# Patient Record
Sex: Female | Born: 1965 | Hispanic: Yes | Marital: Married | Smoking: Never smoker
Health system: Southern US, Community
[De-identification: ages and names within clinical notes are randomized; demographics above are authoritative.]

## PROBLEM LIST (undated history)

## (undated) DIAGNOSIS — J309 Allergic rhinitis, unspecified: Secondary | ICD-10-CM

## (undated) DIAGNOSIS — M5412 Radiculopathy, cervical region: Secondary | ICD-10-CM

## (undated) DIAGNOSIS — T783XXA Angioneurotic edema, initial encounter: Secondary | ICD-10-CM

## (undated) DIAGNOSIS — E119 Type 2 diabetes mellitus without complications: Secondary | ICD-10-CM

## (undated) DIAGNOSIS — K219 Gastro-esophageal reflux disease without esophagitis: Secondary | ICD-10-CM

## (undated) DIAGNOSIS — L500 Allergic urticaria: Secondary | ICD-10-CM

## (undated) DIAGNOSIS — R748 Abnormal levels of other serum enzymes: Secondary | ICD-10-CM

## (undated) DIAGNOSIS — J329 Chronic sinusitis, unspecified: Secondary | ICD-10-CM

## (undated) DIAGNOSIS — K760 Fatty (change of) liver, not elsewhere classified: Secondary | ICD-10-CM

## (undated) DIAGNOSIS — M722 Plantar fascial fibromatosis: Secondary | ICD-10-CM

## (undated) DIAGNOSIS — M7672 Peroneal tendinitis, left leg: Secondary | ICD-10-CM

## (undated) DIAGNOSIS — F4321 Adjustment disorder with depressed mood: Secondary | ICD-10-CM

## (undated) DIAGNOSIS — M6530 Trigger finger, unspecified finger: Secondary | ICD-10-CM

## (undated) DIAGNOSIS — U071 COVID-19: Secondary | ICD-10-CM

## (undated) HISTORY — PX: BREAST SURGERY: SHX581

## (undated) HISTORY — DX: Angioneurotic edema, initial encounter: T78.3XXA

## (undated) HISTORY — DX: Allergic rhinitis, unspecified: J30.9

## (undated) HISTORY — DX: Fatty (change of) liver, not elsewhere classified: K76.0

## (undated) HISTORY — DX: Abnormal levels of other serum enzymes: R74.8

## (undated) HISTORY — DX: Adjustment disorder with depressed mood: F43.21

## (undated) HISTORY — PX: GALLBLADDER SURGERY: SHX652

## (undated) HISTORY — DX: COVID-19: U07.1

## (undated) HISTORY — DX: Plantar fascial fibromatosis: M72.2

## (undated) HISTORY — PX: CHOLECYSTECTOMY: SHX55

## (undated) HISTORY — PX: BREAST EXCISIONAL BIOPSY: SUR124

---

## 2005-07-06 ENCOUNTER — Ambulatory Visit: Payer: Self-pay | Admitting: Family Medicine

## 2005-07-26 ENCOUNTER — Inpatient Hospital Stay: Payer: Self-pay | Admitting: Obstetrics and Gynecology

## 2007-09-25 ENCOUNTER — Ambulatory Visit: Payer: Self-pay

## 2008-12-10 ENCOUNTER — Ambulatory Visit: Payer: Self-pay

## 2010-02-05 ENCOUNTER — Ambulatory Visit: Payer: Self-pay | Admitting: Family Medicine

## 2010-08-25 ENCOUNTER — Emergency Department: Payer: Self-pay | Admitting: Emergency Medicine

## 2013-06-04 ENCOUNTER — Ambulatory Visit (INDEPENDENT_AMBULATORY_CARE_PROVIDER_SITE_OTHER): Payer: BC Managed Care – PPO | Admitting: Podiatry

## 2013-06-04 ENCOUNTER — Ambulatory Visit (INDEPENDENT_AMBULATORY_CARE_PROVIDER_SITE_OTHER): Payer: BC Managed Care – PPO

## 2013-06-04 ENCOUNTER — Encounter: Payer: Self-pay | Admitting: Podiatry

## 2013-06-04 VITALS — BP 107/66 | HR 82 | Resp 16 | Ht 61.0 in | Wt 185.0 lb

## 2013-06-04 DIAGNOSIS — M79609 Pain in unspecified limb: Secondary | ICD-10-CM

## 2013-06-04 DIAGNOSIS — M79672 Pain in left foot: Secondary | ICD-10-CM

## 2013-06-04 DIAGNOSIS — M79671 Pain in right foot: Secondary | ICD-10-CM

## 2013-06-04 DIAGNOSIS — M722 Plantar fascial fibromatosis: Secondary | ICD-10-CM

## 2013-06-04 DIAGNOSIS — M7730 Calcaneal spur, unspecified foot: Secondary | ICD-10-CM

## 2013-06-04 MED ORDER — TRIAMCINOLONE ACETONIDE 10 MG/ML IJ SUSP
5.0000 mg | Freq: Once | INTRAMUSCULAR | Status: AC
  Administered 2013-06-04: 5 mg via INTRA_ARTICULAR

## 2013-06-04 MED ORDER — MELOXICAM 15 MG PO TABS: 15.0000 mg | ORAL_TABLET | Freq: Every day | ORAL | Status: DC

## 2013-06-04 NOTE — Progress Notes (Signed)
Subjective:     Patient ID: Tonya Mueller, female   DOB: 1966/02/12, 47 y.o.   MRN: [id number]  HPI patient presents with son stating that she has  heel pain of 2 years duration. States that she went to another physician who gave her a cortisone injection which helped for a short period of time. She has more pain in the right than the left and it has always been that way in the past. She states that she is not able to do any of the activities that she enjoys doing and is unable to do any physical activity   Review of Systems  All other systems reviewed and are negative.       Objective:   Physical Exam  Nursing note and vitals reviewed. Constitutional: She appears well-developed and well-nourished.  Cardiovascular: Intact distal pulses.   Musculoskeletal: Normal range of motion.  Neurological: She is alert.  Skin: Skin is warm.   the heel bilateral medial calcaneal tubercle is very sore with the right being worse than the left. The patient has a well-developed arch and mild equinus was noted bilateral. No other pathology noted     Assessment:     Plantar fasciitis of the heel right over left.    Plan:     H&P performed and x-rays reviewed. Injected at the plantar fascia bilateral 3 mg Kenalog 5 mg Xylocaine Marcaine mixture and dispensed subfascial braces bilateral with instructions on usage. Prescribed Mobic 15 mg daily #30 and instructed on reduced activity for the next several day. Reappoint 2 weeks

## 2013-06-04 NOTE — Patient Instructions (Signed)
Be easy on the feet for 24 hours and wear braces when wt. bearing

## 2013-06-04 NOTE — Progress Notes (Signed)
N HURT  L B/L HEELS D 35YRS O SUDDEN C WORSE A AFTER SITTING,  T SEEN DR IN [facility] HAD 1 INJECTION IN RT HEEL, EXERCISES,

## 2013-06-18 ENCOUNTER — Ambulatory Visit (INDEPENDENT_AMBULATORY_CARE_PROVIDER_SITE_OTHER): Payer: BC Managed Care – PPO | Admitting: Podiatry

## 2013-06-18 ENCOUNTER — Encounter: Payer: Self-pay | Admitting: Podiatry

## 2013-06-18 VITALS — BP 131/72 | HR 84 | Resp 12 | Ht 64.0 in | Wt 198.0 lb

## 2013-06-18 DIAGNOSIS — M722 Plantar fascial fibromatosis: Secondary | ICD-10-CM

## 2013-06-18 MED ORDER — TRIAMCINOLONE ACETONIDE 10 MG/ML IJ SUSP
5.0000 mg | Freq: Once | INTRAMUSCULAR | Status: AC
  Administered 2013-06-18: 5 mg via INTRA_ARTICULAR

## 2013-06-18 NOTE — Progress Notes (Signed)
Subjective:     Patient ID: Tonya Mueller, female   DOB: 01/16/1966, 47 y.o.   MRN: [id number]  HPI patient states that I am doing better but my heels still hurt me and my right one seems still to be worse than my left want.   Review of Systems     Objective:   Physical Exam  Nursing note and vitals reviewed. Constitutional: She is oriented to person, place, and time. She appears well-developed and well-nourished.  Cardiovascular: Intact distal pulses.   Musculoskeletal: Normal range of motion.  Neurological: She is oriented to person, place, and time.   patient is found to have pain in the plantar heel right and left with inflammation and fluid around the medial band still noted     Assessment:     Plantar fasciitis heel still noted both feet    Plan:     Reinjected the plantar fascia both feet 3 mg Kenalog 5 mg Xylocaine Marcaine mixture and dispensed a night splint with instructions on using on both feet by switching in the evenings

## 2013-07-09 ENCOUNTER — Ambulatory Visit: Payer: BC Managed Care – PPO | Admitting: Podiatry

## 2013-07-23 ENCOUNTER — Encounter: Payer: Self-pay | Admitting: Podiatry

## 2013-07-23 ENCOUNTER — Ambulatory Visit (INDEPENDENT_AMBULATORY_CARE_PROVIDER_SITE_OTHER): Payer: BC Managed Care – PPO | Admitting: Podiatry

## 2013-07-23 VITALS — BP 107/66 | HR 82 | Resp 16

## 2013-07-23 DIAGNOSIS — M722 Plantar fascial fibromatosis: Secondary | ICD-10-CM

## 2013-07-23 MED ORDER — DICLOFENAC SODIUM 75 MG PO TBEC: 75.0000 mg | DELAYED_RELEASE_TABLET | Freq: Two times a day (BID) | ORAL | Status: DC

## 2013-07-23 MED ORDER — TRIAMCINOLONE ACETONIDE 10 MG/ML IJ SUSP
10.0000 mg | Freq: Once | INTRAMUSCULAR | Status: AC
  Administered 2013-07-23: 10 mg

## 2013-07-23 NOTE — Progress Notes (Signed)
Subjective:     Patient ID: Tonya Mueller, female   DOB: 16-Jul-1966, 47 y.o.   MRN: [id number]  HPI patient states that my right heel is doing better but now the left heel seems to be getting worse and it is very bad on concrete floor   Review of Systems     Objective:   Physical Exam Neurovascular status is intact with exquisite discomfort to palpation medial fascially band left heel    Assessment:     Plantar fasciitis left intense nature    Plan:     H&P performed and today I injected the left plantar fascia 3 mg Kenalog 5 of Xylocaine Marcaine mixture placed on Voltaren 75 mg twice a day and scanned for custom orthotic devices to reduce stress on the heels reappoint when they are ready

## 2013-08-30 ENCOUNTER — Encounter: Payer: Self-pay | Admitting: Podiatry

## 2013-08-30 ENCOUNTER — Ambulatory Visit (INDEPENDENT_AMBULATORY_CARE_PROVIDER_SITE_OTHER): Payer: BC Managed Care – PPO | Admitting: Podiatry

## 2013-08-30 VITALS — BP 107/86 | HR 82 | Resp 16

## 2013-08-30 DIAGNOSIS — M722 Plantar fascial fibromatosis: Secondary | ICD-10-CM

## 2013-08-30 NOTE — Patient Instructions (Signed)

## 2013-08-30 NOTE — Progress Notes (Signed)
Pick up orthotics written and verbal instructions given

## 2013-09-01 NOTE — Progress Notes (Signed)
Subjective:     Patient ID: Tonya Mueller, female   DOB: 02/02/1966, 48 y.o.   MRN: [id number]  HPI patient presents stating I'm doing well and still having some discomfort but only when him on cement floors   Review of Systems     Objective:   Physical Exam Neurovascular status intact with no other health history changes noted and mild discomfort to palpation plantar fascia    Assessment:     Improve plantar fasciitis left and right heel    Plan:     Orthotics dispensed with instructions and discussed the continuation of conservative care. Reappoint her recheck 6 weeks

## 2013-10-04 ENCOUNTER — Ambulatory Visit (INDEPENDENT_AMBULATORY_CARE_PROVIDER_SITE_OTHER): Payer: BC Managed Care – PPO | Admitting: Podiatry

## 2013-10-04 ENCOUNTER — Encounter: Payer: Self-pay | Admitting: Podiatry

## 2013-10-04 VITALS — BP 123/74 | HR 97 | Resp 16 | Ht 62.0 in | Wt 198.0 lb

## 2013-10-04 DIAGNOSIS — M722 Plantar fascial fibromatosis: Secondary | ICD-10-CM

## 2013-10-04 MED ORDER — TRIAMCINOLONE ACETONIDE 10 MG/ML IJ SUSP
10.0000 mg | Freq: Once | INTRAMUSCULAR | Status: AC
  Administered 2013-10-04: 10 mg

## 2013-10-04 NOTE — Progress Notes (Signed)
Subjective:     Patient ID: Tonya Mueller, female   DOB: 03/14/1966, 48 y.o.   MRN: [id number]  HPI patient continues to experience discomfort in the left plantar heel at the insertional point with pain worse with walking and after periods of sitting   Review of Systems     Objective:   Physical Exam Neurovascular status intact with patient well oriented x3 and severe discomfort plantar heel left at the insertional point of the tendon into the calcaneus    Assessment:     Continue plantar fasciitis left inflammation and fluid at the medial band    Plan:     Discussed condition and recommended 1 more injection which was accomplished today 3 mg Kenalog 5 a consent Marcaine mixture and advised if this is not better in 3 weeks we will need to consider surgery. Continue with her night splint and orthotics

## 2013-10-24 ENCOUNTER — Emergency Department: Payer: Self-pay | Admitting: Emergency Medicine

## 2013-10-24 LAB — URINALYSIS, COMPLETE
BILIRUBIN, UR: NEGATIVE
Bacteria: NONE SEEN
Blood: NEGATIVE
GLUCOSE, UR: NEGATIVE mg/dL (ref 0–75)
Ketone: NEGATIVE
Nitrite: NEGATIVE
Ph: 7 (ref 4.5–8.0)
Protein: NEGATIVE
RBC,UR: 2 /HPF (ref 0–5)
SPECIFIC GRAVITY: 1.013 (ref 1.003–1.030)
WBC UR: 11 /HPF (ref 0–5)

## 2013-10-24 LAB — COMPREHENSIVE METABOLIC PANEL
ALBUMIN: 3.2 g/dL — AB (ref 3.4–5.0)
ALK PHOS: 139 U/L — AB
ALT: 32 U/L (ref 12–78)
Anion Gap: 4 — ABNORMAL LOW (ref 7–16)
BILIRUBIN TOTAL: 0.8 mg/dL (ref 0.2–1.0)
BUN: 7 mg/dL (ref 7–18)
CALCIUM: 8.4 mg/dL — AB (ref 8.5–10.1)
CHLORIDE: 106 mmol/L (ref 98–107)
CO2: 29 mmol/L (ref 21–32)
Creatinine: 0.47 mg/dL — ABNORMAL LOW (ref 0.60–1.30)
EGFR (African American): >60
Glucose: 102 mg/dL — ABNORMAL HIGH (ref 65–99)
Osmolality: 276 (ref 275–301)
Potassium: 3.7 mmol/L (ref 3.5–5.1)
SGOT(AST): 19 U/L (ref 15–37)
Sodium: 139 mmol/L (ref 136–145)
Total Protein: 6.3 g/dL — ABNORMAL LOW (ref 6.4–8.2)

## 2013-10-24 LAB — CBC
HCT: 40 % (ref 35.0–47.0)
HGB: 13.6 g/dL (ref 12.0–16.0)
MCH: 30.1 pg (ref 26.0–34.0)
MCHC: 34.1 g/dL (ref 32.0–36.0)
MCV: 88 fL (ref 80–100)
PLATELETS: 158 10*3/uL (ref 150–440)
RBC: 4.53 10*6/uL (ref 3.80–5.20)
RDW: 13.3 % (ref 11.5–14.5)
WBC: 8.7 10*3/uL (ref 3.6–11.0)

## 2013-10-24 LAB — LIPASE, BLOOD: Lipase: 211 U/L (ref 73–393)

## 2013-10-24 LAB — TROPONIN I

## 2013-10-29 ENCOUNTER — Ambulatory Visit: Payer: BC Managed Care – PPO | Admitting: Podiatry

## 2013-11-13 ENCOUNTER — Ambulatory Visit: Payer: Self-pay | Admitting: Unknown Physician Specialty

## 2013-11-15 LAB — PATHOLOGY REPORT

## 2013-12-05 ENCOUNTER — Telehealth: Payer: Self-pay | Admitting: *Deleted

## 2013-12-05 ENCOUNTER — Telehealth: Payer: Self-pay | Admitting: Podiatry

## 2013-12-05 ENCOUNTER — Other Ambulatory Visit: Payer: Self-pay | Admitting: *Deleted

## 2013-12-05 NOTE — Telephone Encounter (Signed)
Returned patient call no answer left message for her to call us back

## 2013-12-05 NOTE — Telephone Encounter (Signed)
PT NEEDS TO HAVE SOMEONE CALL HER IN REGARDS TO REACTION TO THE MEDS SHE WAS PUT ON

## 2013-12-06 ENCOUNTER — Encounter: Payer: Self-pay | Admitting: Podiatry

## 2013-12-06 ENCOUNTER — Ambulatory Visit: Payer: BC Managed Care – PPO | Admitting: Podiatry

## 2013-12-06 VITALS — BP 112/64 | HR 82 | Resp 16

## 2013-12-06 DIAGNOSIS — M722 Plantar fascial fibromatosis: Secondary | ICD-10-CM

## 2013-12-06 DIAGNOSIS — L600 Ingrowing nail: Secondary | ICD-10-CM

## 2013-12-06 NOTE — Patient Instructions (Signed)

## 2013-12-06 NOTE — Telephone Encounter (Signed)
Called and left message for pt to return call regarding reaction to medication she is taking.

## 2013-12-08 NOTE — Progress Notes (Signed)
Subjective:     Patient ID: Tonya Mueller, female   DOB: 09/24/1965, 48 y.o.   MRN: [id number]  HPI patient presents stating that her left heel is killing her and she is developing ingrown toenail on her right big toe lateral border that she cannot take care of herself. She states that after the last cortisone injection that she did develop a reaction went to the hospital and they feel that she does have allergy to steroid. States the pain is reaching a point where she cannot work or function and she wants to know what can be done in order to help solve her problem   Review of Systems     Objective:   Physical Exam Neurovascular status was found to be intact with digits found to be well perfused and patient's found to have severe discomfort medial fascial band left at the insertional point of the tendon to the calcaneus. On the right foot there is incurvation of the lateral border and is very painful when pressed    Assessment:     Plantar fasciitis which has not responded to conservative care at this point with inability to use steroid in the future and ingrown toenail deformity right big toe lateral border    Plan:     Reviewed both conditions and discussed chronic heel pain. She states it's been present for a number of months and that she wants to have it fixed and I reviewed endoscopic release of the fascially band going over the procedure and all possible complications. Patient wants surgery at this time she read a consent form [facility] which we reviewed understanding risk as outlined in the consent form. She understands total recovery. Can take 6 months to one year for this procedure and today I dispensed air fracture walker with instructions on usage for after surgery. For the right hallux I have recommended removal of the corner and I explained the risk and procedure. She wants to have this performed and today I infiltrated 60 mg Xylocaine Marcaine mixture remove the lateral border expose the  matrix and applied phenol 3 applications 30 seconds followed by alcohol lavaged and sterile dressing. Patient will be seen back for is to recheck for surgery in the next several weeks

## 2013-12-09 ENCOUNTER — Telehealth: Payer: Self-pay | Admitting: *Deleted

## 2013-12-09 NOTE — Telephone Encounter (Signed)
Pt called wanting percocet. States it was on her "paper" when she left here Friday but did not receive a prescription. She is scheduled for surgery on 4.28.15.

## 2013-12-11 NOTE — Telephone Encounter (Signed)
We will write it for her when she comes in Tuesday for surgery

## 2013-12-11 NOTE — Telephone Encounter (Signed)
Called and left message on pts voicemail letting her know she can receive pain rx on the day of her surgery 4.28.15. She can not have any right now. Per dr Charlsie Merlesregal.

## 2013-12-17 DIAGNOSIS — M722 Plantar fascial fibromatosis: Secondary | ICD-10-CM

## 2013-12-24 ENCOUNTER — Encounter: Payer: Self-pay | Admitting: Podiatry

## 2013-12-24 ENCOUNTER — Ambulatory Visit (INDEPENDENT_AMBULATORY_CARE_PROVIDER_SITE_OTHER): Payer: BC Managed Care – PPO | Admitting: Podiatry

## 2013-12-24 VITALS — BP 119/70 | HR 80 | Resp 12

## 2013-12-24 DIAGNOSIS — M722 Plantar fascial fibromatosis: Secondary | ICD-10-CM

## 2013-12-24 NOTE — Progress Notes (Signed)
Subjective:     Patient ID: Tonya Mueller, female   DOB: 12/22/1965, 48 y.o.   MRN: [id number]  HPI patient states I'm doing fine with my heel with mild discomfort if him on it all day   Review of Systems     Objective:   Physical Exam Neurovascular status intact with discomfort in the plantar heel which   is mild in nature with incision site healing well and wound edges coapted with negative Homans sign noted Assessment:     Healing well from plantar fascial surgery left    Plan:     Dispensed Ace wrap and instructed on physical therapy and continuation of the immobilization. Reappoint 2 weeks for suture removal earlier if necessary

## 2014-01-07 ENCOUNTER — Ambulatory Visit (INDEPENDENT_AMBULATORY_CARE_PROVIDER_SITE_OTHER): Payer: BC Managed Care – PPO | Admitting: Podiatry

## 2014-01-07 VITALS — BP 107/63 | HR 81 | Resp 16

## 2014-01-07 DIAGNOSIS — M722 Plantar fascial fibromatosis: Secondary | ICD-10-CM

## 2014-01-07 NOTE — Progress Notes (Signed)
Subjective:     Patient ID: Tonya Mueller, female   DOB: 01/14/1966, 48 y.o.   MRN: [id number]  HPI patient presents stating I'm doing fine and walking without discomfort   Review of Systems     Objective:   Physical Exam Neurovascular status intact wound edges well healed stitches intact    Assessment:     Doing well postop    Plan:     Stitches removed dressing applied continue with immobilization and reappoint her recheck

## 2014-01-07 NOTE — Progress Notes (Signed)
Pt presents today for suture removal -left foot

## 2014-01-31 ENCOUNTER — Ambulatory Visit (INDEPENDENT_AMBULATORY_CARE_PROVIDER_SITE_OTHER): Payer: BC Managed Care – PPO | Admitting: Podiatry

## 2014-01-31 ENCOUNTER — Encounter: Payer: Self-pay | Admitting: Podiatry

## 2014-01-31 DIAGNOSIS — M722 Plantar fascial fibromatosis: Secondary | ICD-10-CM

## 2014-01-31 NOTE — Progress Notes (Signed)
Subjective:     Patient ID: Tonya Mueller, female   DOB: 04/09/1966, 48 y.o.   MRN: [id number]  HPI patient presents with left foot stating that is doing much better   Review of Systems     Objective:   Physical Exam Neurovascular status intact with significant diminishment of discomfort plantar aspect left foot with incision sites have healed well in heel    Assessment:     Doing well post endoscopic surgery left    Plan:     Gave instructions on return to saw shoe gear and return to work hopefully in 2 weeks

## 2014-02-04 ENCOUNTER — Encounter: Payer: Self-pay | Admitting: Podiatry

## 2014-02-04 NOTE — Progress Notes (Signed)
Dr Charlsie Merlesegal performed a left foot endoscopic plantar fasciotomy on 12/17/2013. Vicodin 10/325mg  #30 1-2 tabs Q4-6 hrs prn pain

## 2017-09-21 ENCOUNTER — Other Ambulatory Visit: Payer: Self-pay | Admitting: Obstetrics & Gynecology

## 2017-09-22 ENCOUNTER — Other Ambulatory Visit: Payer: Self-pay | Admitting: Certified Nurse Midwife

## 2017-09-22 DIAGNOSIS — R928 Other abnormal and inconclusive findings on diagnostic imaging of breast: Secondary | ICD-10-CM

## 2017-09-22 DIAGNOSIS — N644 Mastodynia: Secondary | ICD-10-CM

## 2017-09-27 ENCOUNTER — Encounter: Payer: Self-pay | Admitting: Radiology

## 2017-09-27 ENCOUNTER — Ambulatory Visit
Admission: RE | Admit: 2017-09-27 | Discharge: 2017-09-27 | Disposition: A | Discharge status: Home / Self Care | Payer: BLUE CROSS/BLUE SHIELD | Source: Ambulatory Visit | Attending: Certified Nurse Midwife | Admitting: Certified Nurse Midwife

## 2017-09-27 DIAGNOSIS — N644 Mastodynia: Secondary | ICD-10-CM | POA: Insufficient documentation

## 2017-09-27 DIAGNOSIS — R928 Other abnormal and inconclusive findings on diagnostic imaging of breast: Secondary | ICD-10-CM

## 2019-12-17 ENCOUNTER — Other Ambulatory Visit: Payer: Self-pay | Admitting: Certified Nurse Midwife

## 2019-12-17 DIAGNOSIS — Z1231 Encounter for screening mammogram for malignant neoplasm of breast: Secondary | ICD-10-CM

## 2019-12-31 ENCOUNTER — Other Ambulatory Visit: Payer: Self-pay

## 2019-12-31 ENCOUNTER — Ambulatory Visit
Admission: RE | Admit: 2019-12-31 | Discharge: 2019-12-31 | Disposition: A | Discharge status: Home / Self Care | Payer: BC Managed Care – PPO | Source: Ambulatory Visit | Attending: Certified Nurse Midwife | Admitting: Certified Nurse Midwife

## 2019-12-31 DIAGNOSIS — Z1231 Encounter for screening mammogram for malignant neoplasm of breast: Secondary | ICD-10-CM | POA: Insufficient documentation

## 2020-01-08 ENCOUNTER — Other Ambulatory Visit: Payer: Self-pay | Admitting: Gerontology

## 2020-01-08 DIAGNOSIS — K760 Fatty (change of) liver, not elsewhere classified: Secondary | ICD-10-CM

## 2020-01-08 DIAGNOSIS — R748 Abnormal levels of other serum enzymes: Secondary | ICD-10-CM

## 2020-01-14 ENCOUNTER — Other Ambulatory Visit: Payer: Self-pay | Admitting: Orthopedic Surgery

## 2020-01-14 DIAGNOSIS — M25312 Other instability, left shoulder: Secondary | ICD-10-CM

## 2020-01-14 DIAGNOSIS — S46002A Unspecified injury of muscle(s) and tendon(s) of the rotator cuff of left shoulder, initial encounter: Secondary | ICD-10-CM

## 2020-01-21 ENCOUNTER — Other Ambulatory Visit: Payer: Self-pay

## 2020-01-21 ENCOUNTER — Ambulatory Visit
Admission: RE | Admit: 2020-01-21 | Discharge: 2020-01-21 | Disposition: A | Discharge status: Home / Self Care | Payer: BC Managed Care – PPO | Source: Ambulatory Visit | Attending: Gerontology | Admitting: Gerontology

## 2020-01-21 DIAGNOSIS — R748 Abnormal levels of other serum enzymes: Secondary | ICD-10-CM

## 2020-01-21 DIAGNOSIS — K760 Fatty (change of) liver, not elsewhere classified: Secondary | ICD-10-CM | POA: Diagnosis present

## 2020-02-12 ENCOUNTER — Other Ambulatory Visit: Payer: Self-pay | Admitting: Orthopedic Surgery

## 2020-02-14 ENCOUNTER — Ambulatory Visit
Admission: RE | Admit: 2020-02-14 | Discharge: 2020-02-14 | Disposition: A | Discharge status: Home / Self Care | Payer: BC Managed Care – PPO | Source: Ambulatory Visit | Attending: Orthopedic Surgery | Admitting: Orthopedic Surgery

## 2020-02-14 DIAGNOSIS — S46002A Unspecified injury of muscle(s) and tendon(s) of the rotator cuff of left shoulder, initial encounter: Secondary | ICD-10-CM

## 2020-02-14 DIAGNOSIS — M25312 Other instability, left shoulder: Secondary | ICD-10-CM

## 2020-06-30 ENCOUNTER — Encounter: Payer: Self-pay | Admitting: *Deleted

## 2020-06-30 ENCOUNTER — Other Ambulatory Visit: Payer: Self-pay

## 2020-06-30 ENCOUNTER — Emergency Department
Admission: EM | Admit: 2020-06-30 | Discharge: 2020-06-30 | Disposition: A | Discharge status: Home / Self Care | Payer: BC Managed Care – PPO | Attending: Emergency Medicine | Admitting: Emergency Medicine

## 2020-06-30 DIAGNOSIS — T7840XA Allergy, unspecified, initial encounter: Secondary | ICD-10-CM | POA: Diagnosis not present

## 2020-06-30 MED ORDER — FAMOTIDINE 20 MG PO TABS
20.0000 mg | ORAL_TABLET | Freq: Once | ORAL | Status: AC
  Administered 2020-06-30: 20 mg via ORAL
  Filled 2020-06-30: qty 1

## 2020-06-30 MED ORDER — DIPHENHYDRAMINE HCL 25 MG PO CAPS
25.0000 mg | ORAL_CAPSULE | Freq: Once | ORAL | Status: AC
  Administered 2020-06-30: 25 mg via ORAL
  Filled 2020-06-30: qty 1

## 2020-06-30 MED ORDER — FAMOTIDINE 20 MG PO TABS: 20.0000 mg | ORAL_TABLET | Freq: Two times a day (BID) | ORAL | 0 refills | Status: DC

## 2020-06-30 NOTE — ED Provider Notes (Signed)
[facility] Provider Note ____________________________________________   First MD Initiated Contact with Patient 06/30/20 2219     (approximate)  I have reviewed the triage vital signs and the nursing notes.   HISTORY  Chief Complaint Allergic Reaction  HPI Tonya Mueller is a 54 y.o. female with history as listed below presents to the emergency department for treatment and evaluation of itching on back, groin and thigh with redness and scratchy throat that started about 7pm after having a laser treatment for vericose veins. No relief with aloe. Procedure 3 weeks ago with similar reaction that was relieved after Benadryl.  Patient states that she has had several procedures on her lower extremities for treatment of varicose and better veins.  This therapy was different in that there were injections into the veins.  She is unsure what medication was used but she believes that she is having an allergic reaction.  She states that she called the [facility] but was told that her symptoms are not an allergy to anything they used.  She has not taken any medications since onset of symptoms.         No past medical history on file.  There are no problems to display for this patient.   Past Surgical History:  Procedure Laterality Date   BREAST EXCISIONAL BIOPSY Left 2004-2005   benign   CESAREAN SECTION  2006   GALLBLADDER SURGERY      Prior to Admission medications   Medication Sig Start Date End Date Taking? Authorizing Provider  famotidine (PEPCID) 20 MG tablet Take 1 tablet (20 mg total) by mouth 2 (two) times daily for 7 days. 06/30/20 07/07/20  Chinita Pester, FNP    Allergies Cortisone  Family History  Problem Relation Age of Onset   Breast cancer Neg Hx     Social History Social History   Tobacco Use   Smoking status: Never Smoker   Smokeless tobacco: Never Used  Substance Use Topics   Alcohol use: No     Drug use: No    Review of Systems  Constitutional: No fever/chills Eyes: No visual changes. ENT: No sore throat.  No sensation of airway edema Cardiovascular: Denies chest pain. Respiratory: Denies shortness of breath. Gastrointestinal: No abdominal pain.  No nausea, no vomiting.  No diarrhea.  No constipation. Genitourinary: Negative for dysuria. Musculoskeletal: Negative for back pain. Skin: Positive for rash. Neurological: Negative for headaches, focal weakness or numbness  ____________________________________________   PHYSICAL EXAM:  VITAL SIGNS: ED Triage Vitals  Enc Vitals Group     BP 06/30/20 2110 130/79     Pulse Rate 06/30/20 2110 85     Resp 06/30/20 2110 20     Temp 06/30/20 2110 97.8 F (36.6 C)     Temp Source 06/30/20 2110 Oral     SpO2 --      Weight 06/30/20 2111 183 lb (83 kg)     Height 06/30/20 2111 5\' 2"  (1.575 m)     Head Circumference --      Peak Flow --      Pain Score 06/30/20 2111 0     Pain Loc --      Pain Edu? --      Excl. in [place]? --     Constitutional: Alert and oriented. Well appearing and in no acute distress. Eyes: Conjunctivae are normal.  Head: Atraumatic. Nose: No congestion/rhinnorhea. Mouth/Throat: Mucous membranes are moist.  Oropharynx non-erythematous.  Airway patent.  No obvious swelling of the Neck: No stridor.   Hematological/Lymphatic/Immunilogical: No cervical lymphadenopathy. Cardiovascular: Normal rate, regular rhythm. Grossly normal heart sounds.  Good peripheral circulation. Respiratory: Normal respiratory effort.  No retractions. Lungs CTAB. Gastrointestinal: Soft and nontender. No distention. No abdominal bruits. No CVA tenderness. Genitourinary:  Musculoskeletal: No lower extremity tenderness nor edema.  No joint effusions. Neurologic:  Normal speech and language. No gross focal neurologic deficits are appreciated. No gait instability. Skin: Urticaria noted over the lower extremities, right hip, upper  back. Psychiatric: Mood and affect are normal. Speech and behavior are normal.  ____________________________________________   LABS (all labs ordered are listed, but only abnormal results are displayed)  Labs Reviewed - No data to display ____________________________________________  EKG  Not indicated ____________________________________________  RADIOLOGY  ED MD interpretation:    Not indicated I, Kem Boroughs, personally viewed and evaluated these images (plain radiographs) as part of my medical decision making, as well as reviewing the written report by the radiologist.  Official radiology report(s): No results found.  ____________________________________________   PROCEDURES  Procedure(s) performed (including Critical Care):  Procedures  ____________________________________________   INITIAL IMPRESSION / ASSESSMENT AND PLAN     54 year old female presenting to the emergency department for treatment and evaluation of suspected allergic reaction to some type of medication that was used during treatment of varicose veins.  See HPI for further details.  DIFFERENTIAL DIAGNOSIS  Urticaria, anaphylaxis  ED COURSE  Urticaria is diffuse over lower extremities, right hip, and upper back.  She will be treated with Benadryl and Pepcid.  She has an adverse effect to cortisone.  She has no airway involvement and is mild and therefore risk versus benefit does not support prescribing prednisone.  She was encouraged to notify the vein vascular center of her reaction.  She is scheduled for an additional procedure in approximately 3 weeks.  She was encouraged not to have that third procedure if they are unable to use an alternative medication regimen.  She was advised to follow-up with primary care return to emergency department for additional symptoms of concern.    ___________________________________________   FINAL CLINICAL IMPRESSION(S) / ED DIAGNOSES  Final diagnoses:   Allergic reaction, initial encounter     ED Discharge Orders         Ordered    famotidine (PEPCID) 20 MG tablet  2 times daily        06/30/20 2306           ROMONIA YANIK was evaluated in Emergency Department on 07/05/2020 for the symptoms described in the history of present illness. She was evaluated in the context of the global COVID-19 pandemic, which necessitated consideration that the patient might be at risk for infection with the SARS-CoV-2 virus that causes COVID-19. Institutional protocols and algorithms that pertain to the evaluation of patients at risk for COVID-19 are in a state of rapid change based on information released by regulatory bodies including the CDC and federal and state organizations. These policies and algorithms were followed during the patient's care in the ED.   Note:  This document was prepared using Dragon voice recognition software and may include unintentional dictation errors.   Chinita Pester, FNP 07/05/20 1142    Shaune Pollack, MD 07/06/20 [phone]

## 2020-06-30 NOTE — ED Triage Notes (Signed)
Pt had vein procedure done in legs today.  Now pt has rash on abd and back.  Pt has itching.  No resp distress.  Pt alert  Speech clear.

## 2020-06-30 NOTE — ED Notes (Signed)
Pt calm , collective , denied pain or sob  

## 2020-06-30 NOTE — Discharge Instructions (Signed)
Please discuss the reaction with your doctor at [facility].   Take benadryl every 6 hours if needed for itching and take the pepcid 2 times daily for the next week.  Return to the ER for symptoms that change or worsen if unable to schedule an appointment.

## 2021-05-04 ENCOUNTER — Other Ambulatory Visit: Payer: Self-pay | Admitting: Gerontology

## 2021-05-04 DIAGNOSIS — Z1231 Encounter for screening mammogram for malignant neoplasm of breast: Secondary | ICD-10-CM

## 2021-06-04 ENCOUNTER — Ambulatory Visit
Admission: RE | Admit: 2021-06-04 | Discharge: 2021-06-04 | Disposition: A | Discharge status: Home / Self Care | Payer: BC Managed Care – PPO | Source: Ambulatory Visit | Attending: Gerontology | Admitting: Gerontology

## 2021-06-04 ENCOUNTER — Other Ambulatory Visit: Payer: Self-pay

## 2021-06-04 DIAGNOSIS — Z1231 Encounter for screening mammogram for malignant neoplasm of breast: Secondary | ICD-10-CM | POA: Diagnosis present

## 2021-09-08 ENCOUNTER — Other Ambulatory Visit: Payer: Self-pay | Admitting: Orthopedic Surgery

## 2021-09-08 DIAGNOSIS — M5412 Radiculopathy, cervical region: Secondary | ICD-10-CM

## 2021-09-25 ENCOUNTER — Ambulatory Visit
Admission: RE | Admit: 2021-09-25 | Discharge: 2021-09-25 | Disposition: A | Discharge status: Home / Self Care | Payer: BC Managed Care – PPO | Source: Ambulatory Visit | Attending: Orthopedic Surgery | Admitting: Orthopedic Surgery

## 2021-09-25 ENCOUNTER — Other Ambulatory Visit: Payer: Self-pay

## 2021-09-25 DIAGNOSIS — M5412 Radiculopathy, cervical region: Secondary | ICD-10-CM

## 2021-12-22 ENCOUNTER — Emergency Department: Payer: BC Managed Care – PPO

## 2021-12-22 ENCOUNTER — Other Ambulatory Visit: Payer: Self-pay

## 2021-12-22 ENCOUNTER — Emergency Department
Admission: EM | Admit: 2021-12-22 | Discharge: 2021-12-22 | Disposition: A | Discharge status: Home / Self Care | Payer: BC Managed Care – PPO | Attending: Emergency Medicine | Admitting: Emergency Medicine

## 2021-12-22 ENCOUNTER — Encounter: Payer: Self-pay | Admitting: Emergency Medicine

## 2021-12-22 DIAGNOSIS — J324 Chronic pansinusitis: Secondary | ICD-10-CM | POA: Diagnosis not present

## 2021-12-22 DIAGNOSIS — R0981 Nasal congestion: Secondary | ICD-10-CM | POA: Diagnosis present

## 2021-12-22 MED ORDER — AMOXICILLIN-POT CLAVULANATE 875-125 MG PO TABS: 1.0000 | ORAL_TABLET | Freq: Two times a day (BID) | ORAL | 0 refills | Status: AC

## 2021-12-22 MED ORDER — PREDNISONE 10 MG PO TABS: ORAL_TABLET | ORAL | 0 refills | Status: DC

## 2021-12-22 NOTE — Discharge Instructions (Addendum)
Call to make an appointment with Dr. Willeen Cass who is a nose specialist.  Begin taking the antibiotic and also the steroids that were sent to your pharmacy.  Make sure that you were eating before you take the steroids as it can upset your stomach.  You may take Tylenol with this medication if needed.  Continue using your steroid nasal spray as directed. ?

## 2021-12-22 NOTE — ED Provider Notes (Signed)
? ?[facility] ?Provider Note ? ? ? Event Date/Time  ? First MD Initiated Contact with Patient 12/22/21 1206   ?  (approximate) ? ? ?History  ? ?nasal drainage ? ? ?HPI ? ?Tonya Mueller is a 56 y.o. female   presents to the ED with complaint of left sided nasal drainage for approximately 6 months.  Patient states her symptoms are worse when lying down.  She has talked to her chiropractor and also her physical therapist we have convinced her that she is having CSF coming from her nose.  Patient denies any recent injury.  She states occasionally when she sticks a Kleenex up the left side of her nose that she sees some tiny amount of blood tinged mucus.  She saw her PCP and was placed on a nasal spray which has not helped with her symptoms and now she is extremely concerned that she has something more severe.  She denies any fever, chills, nausea or vomiting.  No upper respiratory symptoms. ? ?  ? ? ?Physical Exam  ? ?Triage Vital Signs: ?ED Triage Vitals  ?Enc Vitals Group  ?   BP 12/22/21 1203 (!) 142/89  ?   Pulse Rate 12/22/21 1203 92  ?   Resp 12/22/21 1203 18  ?   Temp 12/22/21 1203 98.3 ?F (36.8 ?C)  ?   Temp Source 12/22/21 1203 Oral  ?   SpO2 12/22/21 1203 98 %  ?   Weight 12/22/21 1159 182 lb 15.7 oz (83 kg)  ?   Height 12/22/21 1159 5\' 2"  (1.575 m)  ?   Head Circumference --   ?   Peak Flow --   ?   Pain Score 12/22/21 1159 0  ?   Pain Loc --   ?   Pain Edu? --   ?   Excl. in [place]? --   ? ? ?Most recent vital signs: ?Vitals:  ? 12/22/21 1203  ?BP: (!) 142/89  ?Pulse: 92  ?Resp: 18  ?Temp: 98.3 ?F (36.8 ?C)  ?SpO2: 98%  ? ? ? ?General: Awake, no distress.  Alert, oriented, talking without any difficulties. ?CV:  Good peripheral perfusion.  Heart regular rate and rhythm. ?Resp:  Normal effort.  Lungs are clear bilaterally. ?Abd:  No distention.  ?Other:  EACs and TMs are clear bilaterally.  No deformity or rhinorrhea is noted on inspection of the nose.  Oral mucosa and posterior pharynx  normal.  Neck is supple without cervical lymphadenopathy. ? ? ?ED Results / Procedures / Treatments  ? ?Labs ?(all labs ordered are listed, but only abnormal results are displayed) ?Labs Reviewed - No data to display ? ? ? ?RADIOLOGY ?CT scan head without contrast per radiologist was negative for acute intracranial changes.  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  Near complete ossification of the anterior left ethmoid air cells and complete ossification of the left maxillary sinus ? ? ? ? ?PROCEDURES: ? ?Critical Care performed:  ? ?Procedures ? ? ?MEDICATIONS ORDERED IN ED: ?Medications - No data to display ? ? ?IMPRESSION / MDM / ASSESSMENT AND PLAN / ED COURSE  ?I reviewed the triage vital signs and the nursing notes. ? ? ?Differential diagnosis includes, but is not limited to, seasonal rhinitis, polyps, sinusitis, intracranial changes. ? ?56 year old female presents to the ED with complaint of left sided nasal drainage that has been going on for approximately 6 months.  Patient is  here because her chiropractor and her physical therapist have convinced her that she has CSF coming from her nose without history of injury.  On physical exam there is no suspicion of a head injury nor is there history of one.  CT scan head is negative for intracranial changes acutely.  CT maxillofacial does show pansinusitis and patient is being referred to Dr. Richardson Landry who is on-call for [facility].  A prescription for Augmentin and prednisone was sent to her pharmacy.  Although it has listed in her record that she is allergic to cortisone it is stomach upset and not a true allergic reaction.  She has taken cortisone 3 times in the past for various musculoskeletal problems. ? ? ? ?  ? ? ?FINAL CLINICAL IMPRESSION(S) / ED DIAGNOSES  ? ?Final diagnoses:  ?Pansinusitis, unspecified chronicity  ? ? ? ?Rx / DC Orders  ? ?ED Discharge Orders   ? ?      Ordered  ?   amoxicillin-clavulanate (AUGMENTIN) 875-125 MG tablet  2 times daily       ? 12/22/21 1406  ?  predniSONE (DELTASONE) 10 MG tablet       ? 12/22/21 1406  ? ?  ?  ? ?  ? ? ? ?Note:  This document was prepared using Dragon voice recognition software and may include unintentional dictation errors. ?  ?Johnn Hai, PA-C ?12/22/21 1425 ? ?  ?Lavonia Drafts, MD ?12/22/21 1507 ? ?

## 2021-12-22 NOTE — ED Triage Notes (Signed)
C/O left nasal drainage "for a long time".  Patient states symptoms are worse when laying down.  Patient is concerned drainage is CSF. ? ?AAOx3.  Skin warm and dry. NAD ? ?

## 2022-04-18 ENCOUNTER — Other Ambulatory Visit: Payer: Self-pay | Admitting: Gerontology

## 2022-04-18 DIAGNOSIS — Z1231 Encounter for screening mammogram for malignant neoplasm of breast: Secondary | ICD-10-CM

## 2022-05-13 NOTE — Discharge Instructions (Addendum)
[facility]  [place] ENDOSCPICA DE LOS SENOS NASALES  ENDOSCOPIC SINUS SURGERY (SPANISH)  Qu es la ciruga endoscpica funcional de los senos nasales?  La ciruga consiste en ampliar las aberturas naturales de los senos nasales mediante la eliminacin de los tabiques seos que separan los senos nasales de la cavidad nasal. El revestimiento natural de los senos nasales se conserva tanto como sea posible para permitir que los senos nasales vuelvan a funcionar normalmente despus de la Leisure centre manager. En algunos pacientes, se pueden extirpar los plipos nasales (revestimiento excesivamente hinchado de los senos) para Human resources officer obstruccin de las aberturas de los senos. La ciruga se realiza a travs de la nariz, utilizando un instrumento con iluminacin, lo que elimina la necesidad de Optometrist incisiones en la cara. Una septoplastia es un procedimiento diferente que a veces se realiza con la ciruga de los senos nasales. Consiste en enderezar el tabique que separa ambos lados de la [place]. Un tabique nasal torcido o desviado puede necesitar reparacin si est obstruyendo los senos nasales o el flujo de aire por la Lawyer. Tambin se suele realizar una reduccin de los cornetes nasales durante la ciruga de los senos nasales. [place] cornetes son [place] paredes laterales de la nariz que se hinchan y pueden obstruir la nariz en los pacientes con problemas de sinusitis y Set designer. Su tamao puede reducirse quirrgicamente para ayudar a Human resources officer obstruccin nasal.  En me puedo beneficiar con Ardelia Mems ciruga de los senos nasales?  La ciruga de los senos nasales puede reducir la frecuencia de las infecciones de los senos nasales que requieren un tratamiento con antibiticos. Esto puede mejorar la congestin nasal, el drenaje postnasal, la presin facial y la obstruccin nasal. La ciruga NO evitar que vuelva a tener  una infeccin, por lo que suele ser solamente para los pacientes que tienen infecciones 4 o ms veces al ao que necesitan antibiticos, o para las infecciones que no se curan con los antibiticos. No es una cura para las [employer], por lo que los pacientes con Set designer pueden seguir necesitando medicamentos para tratar las alergias despus de la [place]. La ciruga puede [employer] dolores de cabeza relacionados con la sinusitis; sin embargo, Psychologist, clinical seguirn necesitando medicamentos para Chief Technology Officer los dolores de cabeza sinusales relacionados con las [place]. La ciruga no servir para otras formas de dolor de cabeza (migraa, por tensin o por brotes).  Cules son los riesgos de una ciruga endoscpica a los senos nasales?  Las tcnicas actuales permiten realizar la ciruga de forma segura con poco riesgo; sin embargo, hay complicaciones raras que el paciente debe tener en cuenta. Dado que los senos nasales estn situados alrededor [employer] ojos, existe el riesgo de que se produzcan lesiones oculares, incluyendo la ceguera, [place], esto es [place] raro. Esto suele ser un resultado de [place] hemorragia detrs del ojo durante la [place], que puede afectar la visin, aunque existen tratamientos para proteger la visin y Multimedia programmer. Otras complicaciones ms graves son la hemorragia dentro de la cavidad cerebral o el dao al cerebro. Esto ocurre cuando el lquido que rodea al cerebro se filtra hacia la cavidad sinusal. Una vez ms, todas estas complicaciones son poco comunes, y las fugas del lquido cefalorraqudeo pueden tratarse quirrgicamente de forma segura si se producen. La complicacin ms comn de la ciruga de los senos nasales es la hemorragia nasal, que puede requerir el taponamiento o  la cauterizacin de la nariz.  Los pacientes con plipos pueden experimentar una reaparicin de [place], lo cual requerira una ciruga de revisin. Las alteraciones del sentido del  olfato o la lesin de los conductos lagrimales tambin son complicaciones poco frecuentes.  Cmo es la [place] y la recuperacin?  La ciruga normalmente dura un par de horas y suele realizarse con anestesia general (completamente dormido). A los pacientes normalmente se les da de alta despus de un par de horas. A veces durante la ciruga es necesario taponar la nariz para controlar el sangrado, y el taponamiento se deja en su lugar por 24 - 58 horas y despus el cirujano se lo [place]. Si se realiz una septoplastia durante el procedimiento, con frecuencia se coloca una tablilla que debe retirarse a los 5-7 das.  Molestia: El dolor suele ser de nivel leve a moderado, y puede controlarse con analgsicos recetados o acetaminofn (Tylenol). Se deben evitar la aspirina, el ibuprofeno (Advil, Motrin) o el naproxeno (Aleve), ya que estos pueden aumentar el sangrado. La mayora de los pacientes sienten una presin en los senos nasales similar a un resfriado fuerte por [employer]. Dormir con la cabeza elevada puede ayudar a reducir la hinchazn y la presin facial, as como tambin las compresas de hielo colocadas sobre la cara. Un humidificador puede ser til para evitar que la mucosidad y la sangre se sequen en la nariz.  Dieta: No hay restricciones especficas en cuanto a la dieta, sin embargo, por lo general debe comenzar con lquidos claros y una dieta ligera de alimentos blandos porque la anestesia puede provocar algunas nuseas. Avance en su dieta dependiendo de cmo se sienta su estmago. Puede reducir la molestia estomacal causada por la medicina para el dolor si la toma junto con la comida.  Irrigacin nasal con solucin salina: Es Personnel officer los cogulos de sangre y la mucosidad seca de la nariz mientras sta se sana. Esto se hace mediante la irrigacin de la nariz por lo menos 3-4 veces al da con una solucin de agua salina. Recomendamos usar NeilMed Sinus Rinse (se puede obtener en [place]). Llene el frasco dispensador con la solucin, incline la cabeza sobre un lavabo e introduzca la punta del frasco dispensador en la nariz a una media pulgada de profundidad. Dirija la punta del frasco dispensador hacia la esquina interior del ojo del mismo lado que se va a Child psychotherapist. Exprima el frasco e irrguese la nariz suavemente. Si usted se inclina hacia adelante cuando lo hace, la mayor parte del lquido le saldr nuevamente por la Lawyer, en lugar de bajarse por la garganta. La solucin debe estar tibia, casi como la [employer], cuando la irrigue. Cada vez que irrigue, debe usar un frasco dispensador lleno.  Tenga en cuenta que si se le indica que use aerosoles de esteroides nasales en cualquier momento despus de la ciruga, irrguese con la solucin salina ANTES de usar el aerosol esteroide, para que no lo elimine todo de la Lawyer.  Se puede aplicar otro producto, el gel nasal salino (como el AYR Nasal Saline Gel) en cada fosa nasal de 3-4 veces al da para humedecer la nariz y reducir la formacin de costras o cortezas.  Sangrado: Es posible que se presente una secrecin con sangre de la nariz por varios das, y se les indica a los pacientes que se irriguen la nariz frecuentemente con agua salina para ayudar a eliminar la mucosidad y los cogulos de [place]. La secrecin puede ser de color  rojo oscuro o [place], aunque puede verse sangre fresca de forma intermitente, especialmente despus de la irrigacin. No se suene (sople) la Doran Durand, ya que puede provocar un sangrado. Si tiene que estornudar, Quarry manager la boca abierta para permitir que el aire salga por la boca.  Si ocurre un sangrado fuerte: Irrguese la nariz con solucin salina para eliminar los cogulos, luego roce la nariz 3-4 veces con el aerosol nasal antidescongestionante Afrin (Nasal Spray for Congestion). El aerosol [employer]  vasos sanguneos para Sales promotion account executive. Apriete la mitad inferior de la nariz para cerrarla  y Midwife presin y acustese con la cabeza elevada. Tambin le pueden servir compresas de hielo colocadas sobre la [place]. Si el sangrado persiste a pesar de estas medidas, debe avisarle a su mdico. No utilice el aerosol Afrin de forma rutinaria para Aeronautical engineer congestin nasal despus de la ciruga, ya que puede empeorar la congestin y posiblemente afectar el proceso de sanacin.  Actividad: El regreso al trabajo vara de un paciente a otro. La [employer] de los pacientes estarn fuera del trabajo por al menos 5-7 das para recuperarse. El paciente puede volver a trabajar cuando ya no est tomando medicina para Conservation officer, historic buildings con narcticos y se sienta lo suficientemente bien como para Optometrist las funciones de [place]. Los pacientes deben Personnel officer mucho peso (ms de 10 libras) o realizar actividades fsicas vigorosas durante 2 semanas despus de la [place], as que su empleador puede tener que asignarle trabajo ligero o debe dejarlo fuera del trabajo si no es posible hacer trabajo ligero. NOTA: usted no debe manejar, operar maquinaria peligrosa, realizar tareas mentalmente exigentes ni tomar decisiones legales o financieras importantes mientras est tomando medicina para el dolor con narcticos y recuperndose de la anestesia general.  Llame a su doctor de inmediato si le alguno de los siguientes sntomas:   Sangrado que no puede Chief Technology Officer con las Microbiologist.   Prdida de la visin, vista doble, ojos saltones o amoratados.   Fiebre de ms de 101 grados Fahrenheit.   Rigidez del cuello con un dolor de cabeza intenso, fiebre, nuseas y cambio del estado mental.  Siempre se le recomienda llamar en cualquier momento si tiene inquietudes; sin embargo, por favor llame durante el horario de la oficina para pedir resurtidos de Chemical engineer para Conservation officer, historic buildings.  Endoscopa en la oficina: [place], su mdico retirar todo empacado o tablillas que le hayan colocado, Armed forces operational officer y Theatre manager los senos nasales endoscpicamente. Se utilizar una anestesia tpica para hacer esto lo ms cmodo posible, aunque usted puede tomar su medicamento para el dolor antes de la visita. La frecuencia de este procedimiento vara de un paciente a otro. Despus de recuperarse por completo de la ciruga, es posible que necesite endoscopas de seguimiento de vez en cuando, sobre todo si hay preocupacin de infecciones recurrentes o plipos nasales.   [place] ENDOSCOPIC SINUS SURGERY [facility]  What is Functional Endoscopic Sinus Surgery?  The Surgery involves making the natural openings of the sinuses larger by removing the bony partitions that separate the sinuses from the nasal cavity.  The natural sinus lining is preserved as much as possible to allow the sinuses to resume normal function after the surgery.  In some patients nasal polyps (excessively swollen lining of the sinuses) may be removed to relieve obstruction of the sinus openings.  The surgery is performed through the nose using lighted scopes, which eliminates  the need for incisions on the face.  A septoplasty is a different procedure which is sometimes performed with sinus surgery.  It involves straightening the boy partition that separates the two sides of your nose.  A crooked or deviated septum may need repair if is obstructing the sinuses or nasal airflow.  Turbinate reduction is also often performed during sinus surgery.  The turbinates are bony proturberances from the side walls of the nose which swell and can obstruct the nose in patients with sinus and allergy problems.  Their size can be surgically reduced to help relieve nasal obstruction.  What Can Sinus Surgery Do For Me?  Sinus surgery can reduce the frequency of sinus infections requiring antibiotic treatment.  This can provide improvement in nasal congestion, post-nasal drainage, facial  pressure and nasal obstruction.  Surgery will NOT prevent you from ever having an infection again, so it usually only for patients who get infections 4 or more times yearly requiring antibiotics, or for infections that do not clear with antibiotics.  It will not cure nasal allergies, so patients with allergies may still require medication to treat their allergies after surgery. Surgery may improve headaches related to sinusitis, however, some people will continue to require medication to control sinus headaches related to allergies.  Surgery will do nothing for other forms of headache (migraine, tension or cluster).  What Are the Risks of Endoscopic Sinus Surgery?  Current techniques allow surgery to be performed safely with little risk, however, there are rare complications that patients should be aware of.  Because the sinuses are located around the eyes, there is risk of eye injury, including blindness, though again, this would be quite rare. This is usually a result of bleeding behind the eye during surgery, which can effect vision, though there are treatments to protect the vision and prevent permanent injury. More serious complications would include bleeding inside the brain cavity or damage to the brain.This happens when the fluid around the brain leaks out into the sinus cavity.  Again, all of these complications are uncommon, and spinal fluid leaks can be safely managed surgically if they occur.  The most common complication of sinus surgery is bleeding from the nose, which may require packing or cauterization of the nose.  Patients with polyps may experience recurrence of the polyps that would require revision surgery.  Alterations of sense of smell or injury to the tear ducts are also rare complications.   What is the Surgery Like, and what is the Recovery?  The Surgery usually takes a couple of hours to perform, and is usually performed under a general anesthetic (completely asleep).  Patients are  usually discharged home after a couple of hours.  Sometimes during surgery it is necessary to pack the nose to control bleeding, and the packing is left in place for 24 - 48 hours, and removed by your surgeon.  If a septoplasty was performed during the procedure, there is often a splint placed which must be removed after 5-7 days.   Discomfort: Pain is usually mild to moderate, and can be controlled by prescription pain medication or acetaminophen (Tylenol).  Aspirin, Ibuprofen (Advil, Motrin), or Naprosyn (Aleve) should be avoided, as they can cause increased bleeding.  Most patients feel sinus pressure like they have a bad head cold for several days.  Sleeping with your head elevated can help reduce swelling and facial pressure, as can ice packs over the face.  A humidifier may be helpful to keep the mucous and  blood from drying in the nose.   Diet: There are no specific diet restrictions, however, you should generally start with clear liquids and a light diet of bland foods because the anesthetic can cause some nausea.  Advance your diet depending on how your stomach feels.  Taking your pain medication with food will often help reduce stomach upset which pain medications can cause.  Nasal Saline Irrigation: It is important to remove blood clots and dried mucous from the nose as it is healing.  This is done by having you irrigate the nose at least 3 - 4 times daily with a salt water solution.  We recommend using NeilMed Sinus Rinse (available at the drug store).  Fill the squeeze bottle with the solution, bend over a sink, and insert the tip of the squeeze bottle into the nose  of an inch.  Point the tip of the squeeze bottle towards the inside corner of the eye on the same side your irrigating.  Squeeze the bottle and gently irrigate the nose.  If you bend forward as you do this, most of the fluid will flow back out of the nose, instead of down your throat.   The solution should be warm, near body  temperature, when you irrigate.   Each time you irrigate, you should use a full squeeze bottle.   Note that if you are instructed to use Nasal Steroid Sprays at any time after your surgery, irrigate with saline BEFORE using the steroid spray, so you do not wash it all out of the nose. Another product, Nasal Saline Gel (such as AYR Nasal Saline Gel) can be applied in each nostril 3 - 4 times daily to moisture the nose and reduce scabbing or crusting.  Bleeding:  Bloody drainage from the nose can be expected for several days, and patients are instructed to irrigate their nose frequently with salt water to help remove mucous and blood clots.  The drainage may be dark red or brown, though some fresh blood may be seen intermittently, especially after irrigation.  Do not blow you nose, as bleeding may occur. If you must sneeze, keep your mouth open to allow air to escape through your mouth.  If heavy bleeding occurs: Irrigate the nose with saline to rinse out clots, then spray the nose 3 - 4 times with Afrin Nasal Decongestant Spray.  The spray will constrict the blood vessels to slow bleeding.  Pinch the lower half of your nose shut to apply pressure, and lay down with your head elevated.  Ice packs over the nose may help as well. If bleeding persists despite these measures, you should notify your doctor.  Do not use the Afrin routinely to control nasal congestion after surgery, as it can result in worsening congestion and may affect healing.     Activity: Return to work varies among patients. Most patients will be out of work at least 5 - 7 days to recover.  Patient may return to work after they are off of narcotic pain medication, and feeling well enough to perform the functions of their job.  Patients must avoid heavy lifting (over 10 pounds) or strenuous physical for 2 weeks after surgery, so your employer may need to assign you to light duty, or keep you out of work longer if light duty is not possible.   NOTE: you should not drive, operate dangerous machinery, do any mentally demanding tasks or make any important legal or financial decisions while on narcotic pain medication and  recovering from the general anesthetic.    Call Your Doctor Immediately if You Have Any of the Following: Bleeding that you cannot control with the above measures Loss of vision, double vision, bulging of the eye or black eyes. Fever over 101 degrees Neck stiffness with severe headache, fever, nausea and change in mental state. You are always encouraged to call anytime with concerns, however, please call with requests for pain medication refills during office hours.  Office Endoscopy: During follow-up visits your doctor will remove any packing or splints that may have been placed and evaluate and clean your sinuses endoscopically.  Topical anesthetic will be used to make this as comfortable as possible, though you may want to take your pain medication prior to the visit.  How often this will need to be done varies from patient to patient.  After complete recovery from the surgery, you may need follow-up endoscopy from time to time, particularly if there is concern of recurrent infection or nasal polyps.

## 2022-05-16 ENCOUNTER — Encounter: Payer: Self-pay | Admitting: Otolaryngology

## 2022-05-19 IMAGING — MR MR CERVICAL SPINE W/O CM
4 of 5 series · 30 of 48 positions shown · non-contrast
Comparison: None.

CLINICAL DATA: Left-sided neck pain and numbness when tilting head
backwards

EXAM:
MRI CERVICAL SPINE WITHOUT CONTRAST
TECHNIQUE: Multiplanar, multisequence MR imaging of the cervical spine was
performed. No intravenous contrast was administered.

[Series 2: T2 · sagittal · 3.0mm · 0.75mm/px · 8 of 15 slices shown (1 of 2)]
[im 1/15]
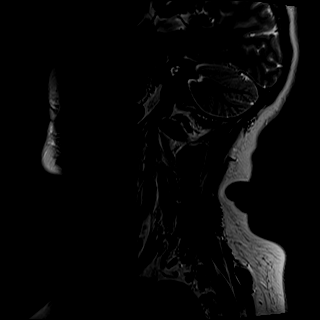
[im 3/15]
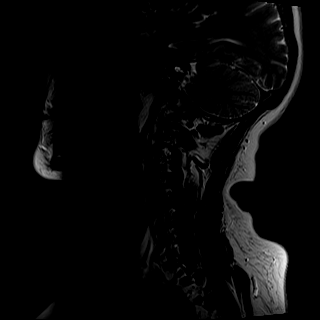
[im 5/15]
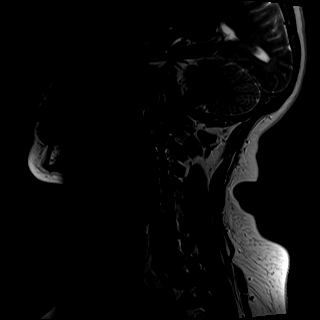
[im 7/15]
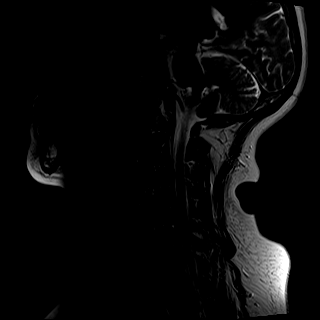
[im 9/15]
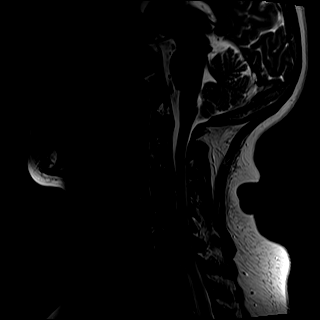
[im 11/15]
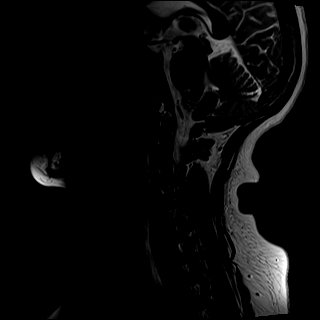
[im 13/15]
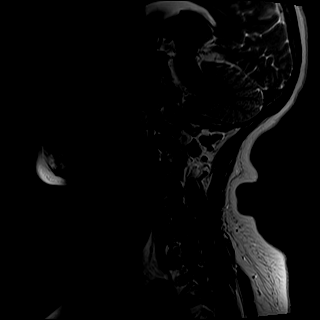
[im 15/15]
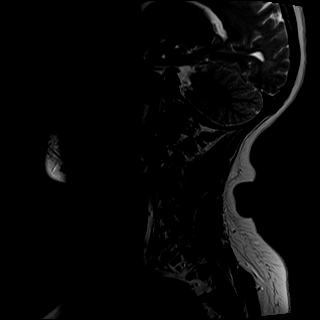

[Series 3: T1 · sagittal · 3.0mm · 0.47mm/px · 7 of 15 slices shown]
[im 1/15]
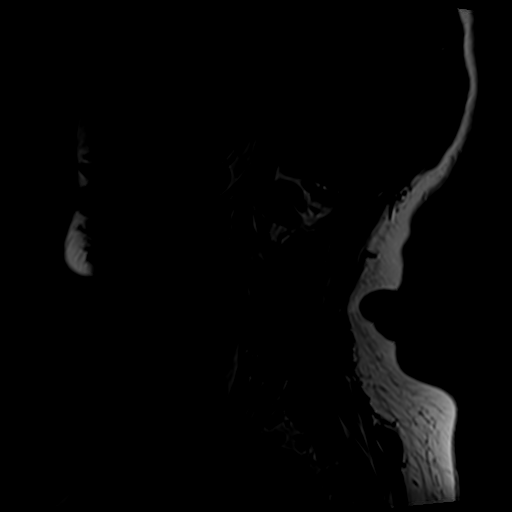
[im 3/15]
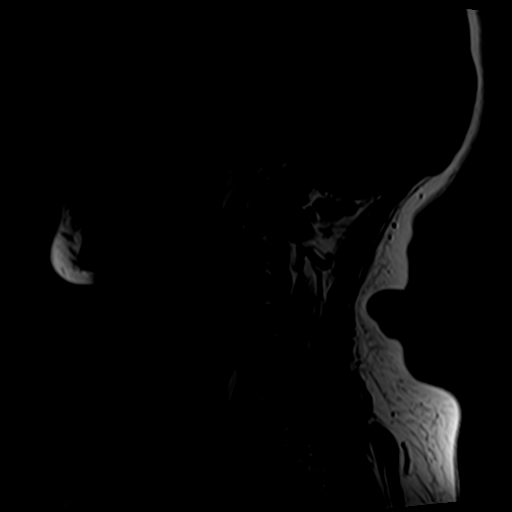
[im 5/15]
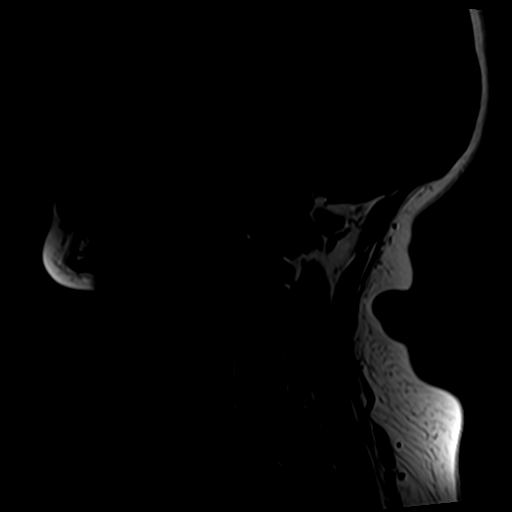
[im 8/15]
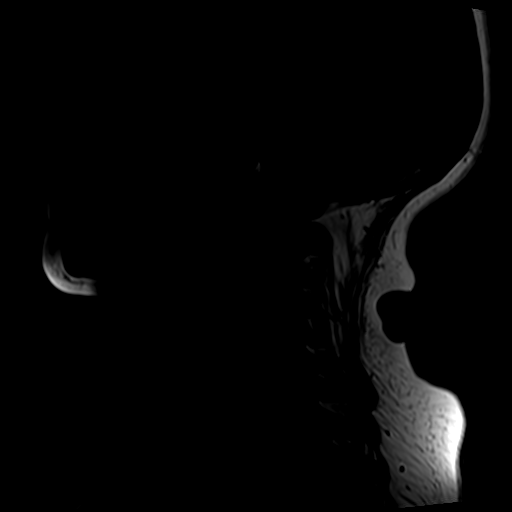
[im 10/15]
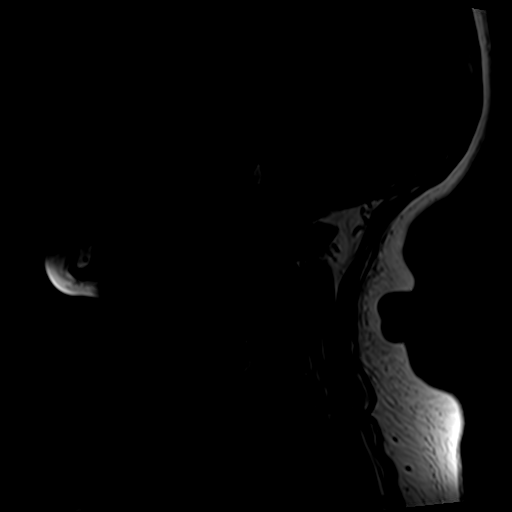
[im 12/15]
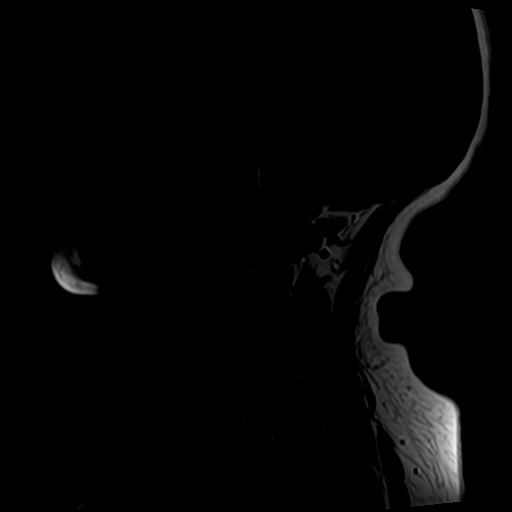
[im 15/15]
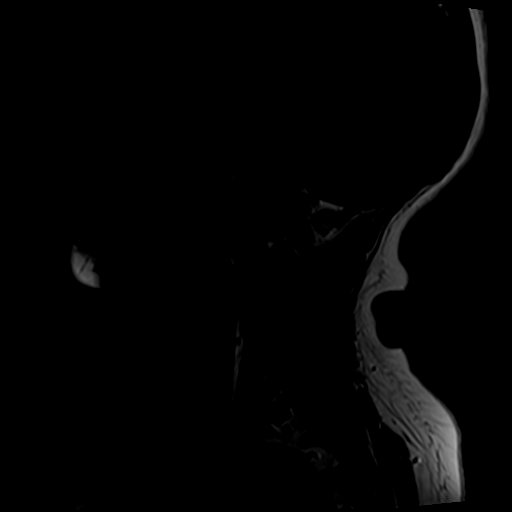

[Series 4: tir sag · sagittal · 3.0mm · 0.47mm/px · 6 of 15 slices shown]
[im 1/15]
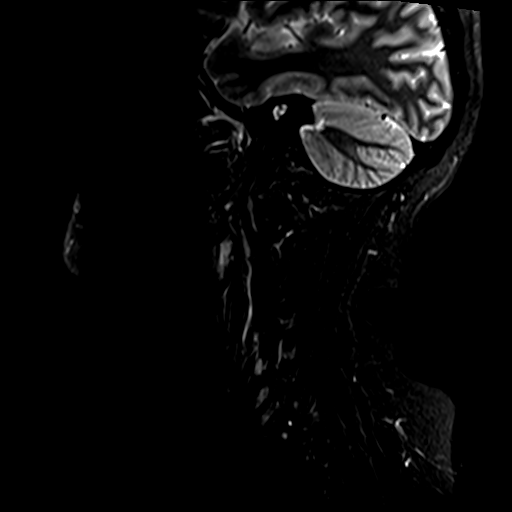
[im 3/15]
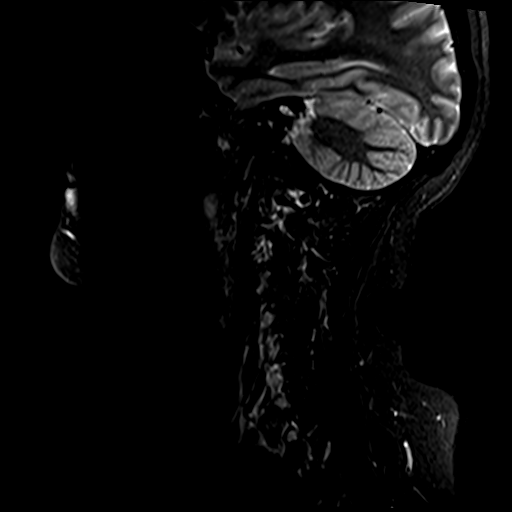
[im 5/15]
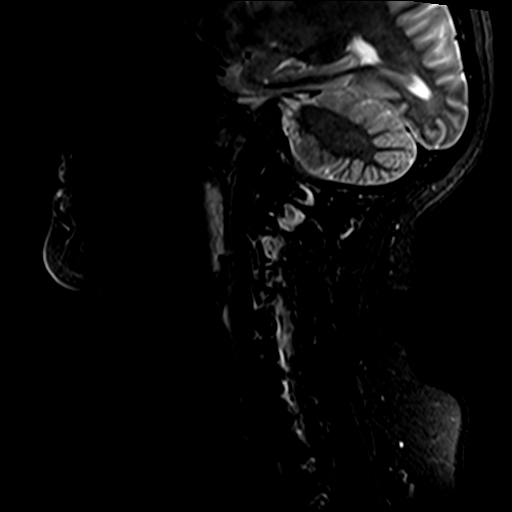
[im 8/15]
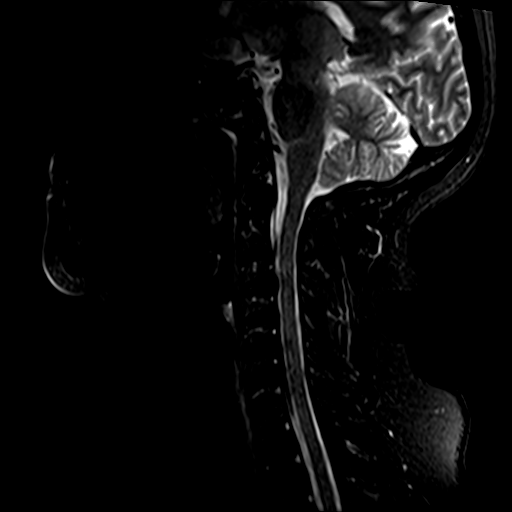
[im 10/15]
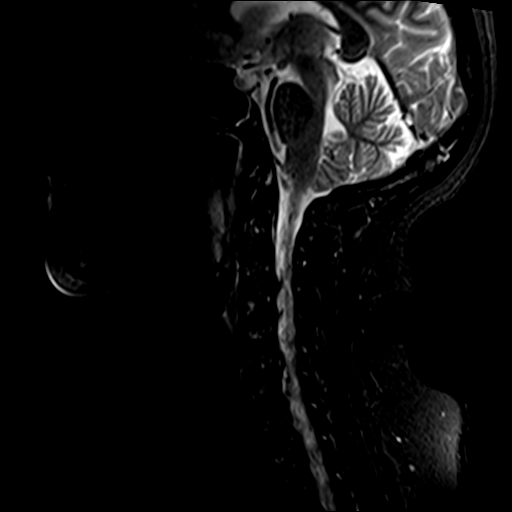
[im 12/15]
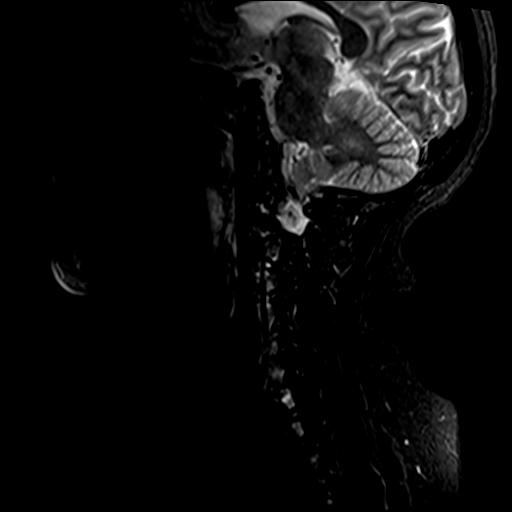

[Series 6: T2 · axial · 3.0mm · 0.70mm/px · z∈[-83,+8]mm · 9 of 26 slices shown (2 of 2)]
[im 1/26]
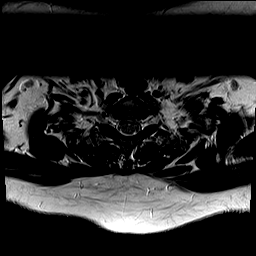
[im 5/26]
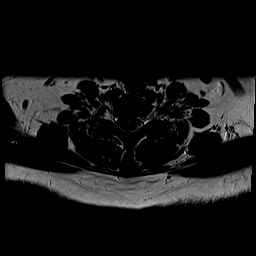
[im 9/26]
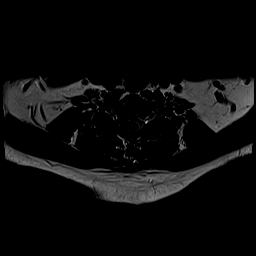
[im 11/26]
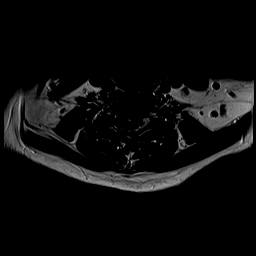
[im 13/26]
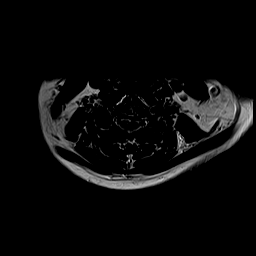
[im 15/26]
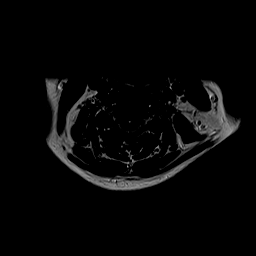
[im 17/26]
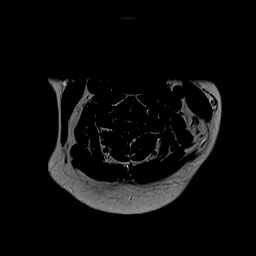
[im 21/26]
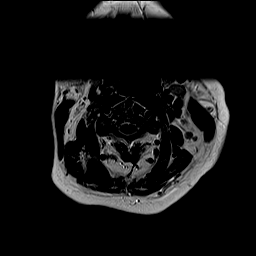
[im 26/26]
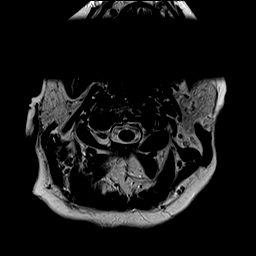

[30 of 48 positions shown; findings below may reference images not displayed]

FINDINGS: Alignment: Mild straightening of the normal cervical lordosis. No
listhesis.

Vertebrae: No acute fracture or suspicious osseous lesion.
Congenitally short pedicles, which narrow the AP diameter of the
spinal canal.

Cord: Normal signal and morphology.

Posterior Fossa, vertebral arteries, paraspinal tissues: Negative.

Disc levels:

C2-C3: No significant disc bulge. Mild right uncovertebral
hypertrophy. No spinal canal stenosis or neuroforaminal narrowing.

C3-C4: Mild disc bulge. Right-greater-than-left uncovertebral
hypertrophy. Mild spinal canal stenosis. Mild right neural foraminal
narrowing.

C4-C5: No significant disc bulge. No spinal canal stenosis or neural
foraminal narrowing.

C5-C6: No significant disc bulge. No spinal canal stenosis or neural
foraminal narrowing.

C6-C7: Mild disc bulge with superimposed left subarticular and
foraminal disc protrusion (series 6, image 19). Mild spinal canal
stenosis. Moderate to severe left neural foraminal narrowing.

C7-T1: No significant disc bulge. No spinal canal stenosis or neural
foraminal narrowing.
IMPRESSION: 1. C6-C7 mild spinal canal stenosis and moderate to severe left
neural foraminal narrowing, secondary to a left subarticular and
foraminal disc protrusion.
2. C3-C4 mild spinal canal stenosis and mild right neural foraminal
narrowing.

## 2022-05-24 ENCOUNTER — Ambulatory Visit: Payer: BC Managed Care – PPO | Admitting: Anesthesiology

## 2022-05-24 ENCOUNTER — Ambulatory Visit
Admission: RE | Admit: 2022-05-24 | Discharge: 2022-05-24 | Disposition: A | Discharge status: Home / Self Care | Payer: BC Managed Care – PPO | Attending: Otolaryngology | Admitting: Otolaryngology

## 2022-05-24 ENCOUNTER — Other Ambulatory Visit: Payer: Self-pay

## 2022-05-24 ENCOUNTER — Encounter
Admission: RE | Disposition: A | Discharge status: Home / Self Care | Payer: Self-pay | Source: Home / Self Care | Attending: Otolaryngology

## 2022-05-24 ENCOUNTER — Encounter: Payer: Self-pay | Admitting: Otolaryngology

## 2022-05-24 DIAGNOSIS — J322 Chronic ethmoidal sinusitis: Secondary | ICD-10-CM | POA: Diagnosis present

## 2022-05-24 DIAGNOSIS — Z7984 Long term (current) use of oral hypoglycemic drugs: Secondary | ICD-10-CM | POA: Insufficient documentation

## 2022-05-24 DIAGNOSIS — E119 Type 2 diabetes mellitus without complications: Secondary | ICD-10-CM | POA: Diagnosis not present

## 2022-05-24 DIAGNOSIS — J320 Chronic maxillary sinusitis: Secondary | ICD-10-CM | POA: Diagnosis not present

## 2022-05-24 DIAGNOSIS — J321 Chronic frontal sinusitis: Secondary | ICD-10-CM | POA: Diagnosis not present

## 2022-05-24 HISTORY — PX: ETHMOIDECTOMY: SHX5197

## 2022-05-24 HISTORY — DX: Type 2 diabetes mellitus without complications: E11.9

## 2022-05-24 HISTORY — PX: MAXILLARY ANTROSTOMY: SHX2003

## 2022-05-24 HISTORY — DX: Gastro-esophageal reflux disease without esophagitis: K21.9

## 2022-05-24 HISTORY — PX: IMAGE GUIDED SINUS SURGERY: SHX6570

## 2022-05-24 LAB — GLUCOSE, CAPILLARY
Glucose-Capillary: 144 mg/dL — ABNORMAL HIGH (ref 70–99)
Glucose-Capillary: 146 mg/dL — ABNORMAL HIGH (ref 70–99)

## 2022-05-24 SURGERY — SINUS SURGERY, WITH IMAGING GUIDANCE
Anesthesia: General | Site: Nose | Laterality: Left

## 2022-05-24 MED ORDER — SUCCINYLCHOLINE CHLORIDE 200 MG/10ML IV SOSY
PREFILLED_SYRINGE | INTRAVENOUS | Status: DC | PRN
  Administered 2022-05-24: 50 mg via INTRAVENOUS

## 2022-05-24 MED ORDER — LIDOCAINE HCL (CARDIAC) PF 100 MG/5ML IV SOSY
PREFILLED_SYRINGE | INTRAVENOUS | Status: DC | PRN
  Administered 2022-05-24: 100 mg via INTRAVENOUS

## 2022-05-24 MED ORDER — MIDAZOLAM HCL 5 MG/5ML IJ SOLN
INTRAMUSCULAR | Status: DC | PRN
  Administered 2022-05-24: 2 mg via INTRAVENOUS

## 2022-05-24 MED ORDER — OXYMETAZOLINE HCL 0.05 % NA SOLN
NASAL | Status: DC | PRN
  Administered 2022-05-24: 1

## 2022-05-24 MED ORDER — OXYCODONE HCL 5 MG PO TABS: 5.0000 mg | ORAL_TABLET | Freq: Once | ORAL | Status: AC | PRN

## 2022-05-24 MED ORDER — PROPOFOL 10 MG/ML IV BOLUS
INTRAVENOUS | Status: DC | PRN
  Administered 2022-05-24: 200 mg via INTRAVENOUS

## 2022-05-24 MED ORDER — EPHEDRINE SULFATE (PRESSORS) 50 MG/ML IJ SOLN
INTRAMUSCULAR | Status: DC | PRN
  Administered 2022-05-24: 10 mg via INTRAVENOUS

## 2022-05-24 MED ORDER — OXYCODONE HCL 5 MG/5ML PO SOLN
5.0000 mg | Freq: Once | ORAL | Status: AC | PRN
  Administered 2022-05-24: 5 mg via ORAL

## 2022-05-24 MED ORDER — ONDANSETRON HCL 4 MG/2ML IJ SOLN
INTRAMUSCULAR | Status: DC | PRN
  Administered 2022-05-24: 4 mg via INTRAVENOUS

## 2022-05-24 MED ORDER — LACTATED RINGERS IV SOLN: INTRAVENOUS | Status: DC

## 2022-05-24 MED ORDER — PHENYLEPHRINE HCL (PRESSORS) 10 MG/ML IV SOLN
INTRAVENOUS | Status: DC | PRN
  Administered 2022-05-24: 50 ug via INTRAVENOUS
  Administered 2022-05-24: 100 ug via INTRAVENOUS
  Administered 2022-05-24 (×2): 50 ug via INTRAVENOUS

## 2022-05-24 MED ORDER — LIDOCAINE-EPINEPHRINE 1 %-1:100000 IJ SOLN
INTRAMUSCULAR | Status: DC | PRN
  Administered 2022-05-24: 4 mL

## 2022-05-24 MED ORDER — DEXAMETHASONE SODIUM PHOSPHATE 4 MG/ML IJ SOLN
INTRAMUSCULAR | Status: DC | PRN
  Administered 2022-05-24: 4 mg via INTRAVENOUS

## 2022-05-24 MED ORDER — AMOXICILLIN-POT CLAVULANATE 875-125 MG PO TABS: 1.0000 | ORAL_TABLET | Freq: Two times a day (BID) | ORAL | 0 refills | Status: DC

## 2022-05-24 MED ORDER — METOPROLOL TARTRATE 5 MG/5ML IV SOLN
INTRAVENOUS | Status: DC | PRN
  Administered 2022-05-24: 1 mg via INTRAVENOUS

## 2022-05-24 MED ORDER — HYDROCODONE-ACETAMINOPHEN 5-325 MG PO TABS: 1.0000 | ORAL_TABLET | Freq: Four times a day (QID) | ORAL | 0 refills | Status: DC | PRN

## 2022-05-24 MED ORDER — FENTANYL CITRATE PF 50 MCG/ML IJ SOSY
25.0000 ug | PREFILLED_SYRINGE | INTRAMUSCULAR | Status: DC | PRN
  Administered 2022-05-24 (×2): 25 ug via INTRAVENOUS

## 2022-05-24 MED ORDER — FENTANYL CITRATE (PF) 100 MCG/2ML IJ SOLN
INTRAMUSCULAR | Status: DC | PRN
  Administered 2022-05-24: 25 ug via INTRAVENOUS
  Administered 2022-05-24: 50 ug via INTRAVENOUS
  Administered 2022-05-24: 25 ug via INTRAVENOUS
  Administered 2022-05-24 (×2): 50 ug via INTRAVENOUS

## 2022-05-24 SURGICAL SUPPLY — 25 items
BLADE SHAVER TRUDI 4 15 DEG (ENT DISPOSABLE) ×1 IMPLANT
BLADE SHAVER TRUDI STR 4 (ENT DISPOSABLE) ×1 IMPLANT
CABLE TRUDI DISPOSABLE (ENT DISPOSABLE) ×2 IMPLANT
CANISTER SUCT 1200ML W/VALVE (MISCELLANEOUS) ×1 IMPLANT
COAGULATOR SUCT 8FR VV (MISCELLANEOUS) ×1 IMPLANT
CURETTE TRUDI 5 0D SHARP TIP (MISCELLANEOUS) IMPLANT
ELECT REM PT RETURN 9FT ADLT (ELECTROSURGICAL) ×1
ELECTRODE REM PT RTRN 9FT ADLT (ELECTROSURGICAL) ×1 IMPLANT
GLOVE SURG ENC MOIS LTX SZ7.5 (GLOVE) ×2 IMPLANT
GOWN STRL REUS W/ TWL LRG LVL3 (GOWN DISPOSABLE) ×1 IMPLANT
GOWN STRL REUS W/TWL LRG LVL3 (GOWN DISPOSABLE) ×1
IV NS 500ML (IV SOLUTION) ×1
IV NS 500ML BAXH (IV SOLUTION) ×1 IMPLANT
KIT TURNOVER KIT A (KITS) ×1 IMPLANT
NS IRRIG 500ML POUR BTL (IV SOLUTION) ×1 IMPLANT
PACK ENT CUSTOM (PACKS) ×1 IMPLANT
PACKING NASAL EPIS 4X2.4 XEROG (MISCELLANEOUS) IMPLANT
PATTIES SURGICAL .5 X3 (DISPOSABLE) ×1 IMPLANT
SOL ANTI-FOG 6CC FOG-OUT (MISCELLANEOUS) ×1 IMPLANT
SOL FOG-OUT ANTI-FOG 6CC (MISCELLANEOUS) ×1
STRAP BODY AND KNEE 60X3 (MISCELLANEOUS) IMPLANT
SYR 10ML LL (SYRINGE) ×1 IMPLANT
TRACKER DISPOSABLE PAITIENT (MISCELLANEOUS) ×1 IMPLANT
TUBING CONNECTING 10 (TUBING) ×1 IMPLANT
TUBING IRRIGATION BIEN-AIR (TUBING) ×1 IMPLANT

## 2022-05-24 NOTE — Anesthesia Postprocedure Evaluation (Signed)
Anesthesia Post Note  Patient: Tonya Mueller  Procedure(s) Performed: IMAGE GUIDED SINUS SURGERY (Left: Nose) MAXILLARY ANTROSTOMY WITH TISSUE REMOVAL (Left: Nose) ETHMOIDECTOMY- TOTAL WITH FRONTAL SINUS EXPLORATION (Left: Nose)  Patient location during evaluation: PACU Anesthesia Type: General Level of consciousness: awake and alert Pain management: pain level controlled Vital Signs Assessment: post-procedure vital signs reviewed and stable Respiratory status: spontaneous breathing, nonlabored ventilation, respiratory function stable and patient connected to nasal cannula oxygen Cardiovascular status: blood pressure returned to baseline and stable Postop Assessment: no apparent nausea or vomiting Anesthetic complications: no   No notable events documented.   Last Vitals:  Vitals:   05/24/22 1150 05/24/22 1155  BP:    Pulse: 95 94  Resp: 11 15  Temp:    SpO2: 98% 97%    Last Pain:  Vitals:   05/24/22 1155  TempSrc:   PainSc: 0-No pain                 Ilene Qua

## 2022-05-24 NOTE — H&P (Signed)
Tonya Mueller, Tonya Mueller [id number] 14-Feb-1966  Date of Admission: @TODAY @ Admitting Physician: Riley Nearing  Chief Complaint: Chronic sinusitis  HPI: This 56 y.o. year old female with left side chronic sinusitis unresponsive to medical management presents for endoscopic sinus surgery.  She has had no new illnesses since her office visit in August.  Medications:  Medications Prior to Admission  Medication Sig Dispense Refill   atorvastatin (LIPITOR) 20 MG tablet Take 20 mg by mouth daily.     DULoxetine (CYMBALTA) 60 MG capsule Take 60 mg by mouth daily.     fluticasone (FLONASE) 50 MCG/ACT nasal spray Place into both nostrils daily.     gabapentin (NEURONTIN) 100 MG capsule Take 100 mg by mouth at bedtime.     ipratropium (ATROVENT) 0.03 % nasal spray Place 2 sprays into both nostrils every 12 (twelve) hours.     levocetirizine (XYZAL) 5 MG tablet Take 5 mg by mouth every evening.     meloxicam (MOBIC) 15 MG tablet Take 15 mg by mouth daily.     metFORMIN (GLUCOPHAGE) 500 MG tablet Take 500 mg by mouth daily with supper.     omeprazole (PRILOSEC) 20 MG capsule Take 20 mg by mouth daily.      Allergies:  Allergies  Allergen Reactions   Cortisone Other (See Comments)    Patient experienced severe stomach pains and bloating; 05/24/22: pt has had an injection recently without issues    PMH:  Past Medical History:  Diagnosis Date   GERD (gastroesophageal reflux disease)    Type 2 diabetes mellitus ([place])     Fam Hx:  Family History  Problem Relation Age of Onset   Breast cancer Neg Hx     Soc Hx:  Social History   Socioeconomic History   Marital status: Married    Spouse name: Not on file   Number of children: Not on file   Years of education: Not on file   Highest education level: Not on file  Occupational History   Not on file  Tobacco Use   Smoking status: Never   Smokeless tobacco: Never  Vaping Use   Vaping Use: Never used  Substance and Sexual Activity   Alcohol use:  No   Drug use: No   Sexual activity: Not on file  Other Topics Concern   Not on file  Social History Narrative   Not on file   Social Determinants of Health   Financial Resource Strain: Not on file  Food Insecurity: Not on file  Transportation Needs: Not on file  Physical Activity: Not on file  Stress: Not on file  Social Connections: Not on file  Intimate Partner Violence: Not on file    PSH:  Past Surgical History:  Procedure Laterality Date   BREAST EXCISIONAL BIOPSY Left 2004-2005   benign   CESAREAN SECTION  2006   GALLBLADDER SURGERY    .   PHYSICAL EXAM  Vitals: Blood pressure 129/80, pulse 98, temperature 98.1 F (36.7 C), temperature source Temporal, height 5' 2.5" (1.588 m), weight 101 kg, last menstrual period 05/13/2013, SpO2 98 %.. General: Well-developed, Well-nourished in no acute distress Mood: Mood and affect well adjusted, pleasant and cooperative. Orientation: Grossly alert and oriented. Vocal Quality: No hoarseness. Communicates verbally. head and Face: NCAT. No facial asymmetry. No visible skin lesions. No significant facial scars. No tenderness with sinus percussion. Facial strength normal and symmetric. Respiratory: Normal respiratory effort without labored breathing. Lungs CTA bilaterally Cardiovascular: Heart exam shows regular rate and  rhythm  ASSESSMENT: Chronic left maxillary, frontal and ethmoid sinusitis  PLAN: Left endoscopic maxillary antrostomy with tissue removal, total ethmoidectomy, and frontal recess exploration with image guidance.   Sandi Mealy 05/24/2022 8:33 AM

## 2022-05-24 NOTE — Op Note (Signed)
05/24/2022  11:16 AM    Tonya Mueller  [id number]   Pre-Op Diagnosis:  LEFT CHRONIC SINUSITIS  Post-op Diagnosis: LEFT CHRONIC SINUSITIS  Procedure:  1)  Image Guided Sinus Surgery,   2)  Left Endoscopic Maxillary Antrostomy with Tissue Removal   3)  Left Frontal Sinusotomy   4)  Left Total Ethmoidectomy    Surgeon:  Riley Nearing  Anesthesia:  General endotracheal  EBL: 75 cc  Complications:  None  Findings: Gross purulence was in the left maxillary sinus with substantial mucosal inflammation of the maxillary, ethmoid and frontal recess regions on the left.  The normally thin ethmoidal septi and septi in the frontal recess were more dense than usual and calcified from chronic inflammation.  The sphenoid recess appeared clear with a patent sphenoid os visualized.  There did appear to be an anterolateral calcified density in the maxillary sinus but this was not accessible with instruments and did not appear mobile when manipulated with the curved suction.  Cultures were taken from the left maxillary sinus.  Procedure: After the patient was identified in holding and the benefits of the procedure were reviewed as well as the consent and risks, the patient was taken to the operating room and with the patient in a comfortable supine position,  general orotracheal anesthesia was induced without difficulty.  A proper time-out was performed.  The Trudi image guidance system was set up and calibrated in the normal fashion and felt to be acceptable.  Next 1% Xylocaine with 1:100,000 epinephrine was infiltrated into the inferior turbinates, septum, and anterior middle turbinates bilaterally.  Several minutes were allowed for this to take effect.  Cottonoid pledgets soaked in Afrin were placed into both nasal cavities and left while the patient was prepped and draped in the standard fashion. The image guided suction was calibrated and used to inspect known points in the nasal cavity to assess  accuracy of the image guided system. Accuracy was felt to be excellent.   The left middle turbinate was medialized and the uncinate process then resected with through-cutting forceps as well as the microdebrider. In this fashion the uncinate was completely removed along with inflamed soft tissue and bone of the medial wall of the maxillary sinus to create a large patent maxillary antrostomy. The left maxillary sinus was suctioned to clear secretions, which were grossly purulent.  Cultures were obtained.  Next the left anterior ethmoid sinuses were dissected beginning inferomedially, entering the ethmoid bulla. Thru cut forceps were used to open the anterior ethmoids. The microdebrider was used as needed to trim loose mucosal edges.  Next the basal lamella was entered and, working back in a sequential fashion through the ethmoid air cells, the ethmoid sinuses were dissected to the posterior ethmoid sinuses, utilizing the image guided suction to reassess the anatomy frequently. Thru cutting forceps were used for this dissection. Care was taken to avoid injury to the lamina papyracea laterally and the skull base superiorly.  The ethmoid sinus septi were more densely calcified than usual, which made dissection more difficult.  Dissection proceeded anteriorly and superiorly into the left frontal recess which was dissected utilizing a 30 and then a 70 scope and frontal curved instruments. The curved image guided suction was used during this dissection to frequently reassess the anatomy on the CT scan. The frontal recess was dissected until a suction could be passed up into the region of the frontal sinus.  This area was very narrow and while an opening could  be visualized, the suction could not be passed into the sinus safely.  The recess however was felt to be patent.  Next the scope was passed medial to the middle turbinate and the left sphenoid recess inspected.  The sphenoid recess was clear and a patent  sphenoid os was visualized with no sign of infection. Attention was then turned back to the maxillary sinus which was irrigated with saline and inspected with a 70 degree scope.  There was a calcified density in the maxillary sinus anterolaterally on the CT scan.  The 70 degree scope could just visualize a whitish density in this region.  A curved suction was used to try to manipulate this density but it appeared attached to the sinus wall as it was not mobile.  The nose was suctioned and inspected with all but minor oozing controlled. Xerogel absorbable sinus packing was then placed in the ethmoid cavity on the left. The patient was then returned to the anesthesiologist for awakening and taken to recovery room in good condition postoperatively.  Disposition:   PACU and d/c home  Plan: Ice, elevation, narcotic analgesia and prophylactic antibiotics. Begin sinus irrigations with saline tomorrow, irrigating 3-4 times daily. Return to the office in 7 days.  Return to work in 7-10 days, no strenuous activities for two weeks.   Riley Nearing 05/24/2022 11:16 AM

## 2022-05-24 NOTE — Anesthesia Preprocedure Evaluation (Signed)
Anesthesia Evaluation  Patient identified by MRN, date of birth, ID band Patient awake    Reviewed: Allergy & Precautions, NPO status , Patient's Chart, lab work & pertinent test results  History of Anesthesia Complications Negative for: history of anesthetic complications  Airway Mallampati: I  TM Distance: >3 FB Neck ROM: Full    Dental no notable dental hx. (+) Upper Dentures   Pulmonary neg pulmonary ROS,    Pulmonary exam normal breath sounds clear to auscultation       Cardiovascular negative cardio ROS Normal cardiovascular exam Rhythm:Regular Rate:Normal  PVC's noted preop    Neuro/Psych  Neuromuscular disease ("pinched nerve" in neck, numbness/tingling in L fingers ) negative psych ROS   GI/Hepatic Neg liver ROS, GERD  ,  Endo/Other  diabetes, Type 2, Oral Hypoglycemic AgentsMorbid obesity  Renal/GU negative Renal ROS  negative genitourinary   Musculoskeletal negative musculoskeletal ROS (+)   Abdominal   Peds negative pediatric ROS (+)  Hematology negative hematology ROS (+)   Anesthesia Other Findings   Reproductive/Obstetrics negative OB ROS                            Anesthesia Physical Anesthesia Plan  ASA: 3  Anesthesia Plan: General ETT   Post-op Pain Management: Tylenol PO (post-op), Ketamine IV and Toradol IV (intra-op)   Induction: Intravenous  PONV Risk Score and Plan: 4 or greater and Ondansetron, Dexamethasone, Treatment may vary due to age or medical condition and Midazolam  Airway Management Planned: Oral ETT  Additional Equipment:   Intra-op Plan:   Post-operative Plan: Extubation in OR  Informed Consent: I have reviewed the patients History and Physical, chart, labs and discussed the procedure including the risks, benefits and alternatives for the proposed anesthesia with the patient or authorized representative who has indicated his/her  understanding and acceptance.     Dental Advisory Given  Plan Discussed with: Anesthesiologist, CRNA and Surgeon  Anesthesia Plan Comments: (Patient consented for risks of anesthesia including but not limited to:  - adverse reactions to medications - damage to eyes, teeth, lips or other oral mucosa - nerve damage due to positioning  - sore throat or hoarseness - Damage to heart, brain, nerves, lungs, other parts of body or loss of life  Patient voiced understanding.)        Anesthesia Quick Evaluation

## 2022-05-24 NOTE — Anesthesia Procedure Notes (Signed)
Procedure Name: Intubation Date/Time: 05/24/2022 9:25 AM  Performed by: Moises Blood, CRNAPre-anesthesia Checklist: Patient identified, Emergency Drugs available, Suction available, Patient being monitored and Timeout performed Patient Re-evaluated:Patient Re-evaluated prior to induction Oxygen Delivery Method: Circle system utilized Preoxygenation: Pre-oxygenation with 100% oxygen Induction Type: IV induction Ventilation: Mask ventilation without difficulty and Oral airway inserted - appropriate to patient size Laryngoscope Size: 3 and Mac Grade View: Grade II Tube type: Oral Tube size: 7.0 mm Number of attempts: 1 Airway Equipment and Method: Oral airway Placement Confirmation: ETT inserted through vocal cords under direct vision, positive ETCO2, CO2 detector and breath sounds checked- equal and bilateral Secured at: 23 cm Tube secured with: Tape Dental Injury: Teeth and Oropharynx as per pre-operative assessment

## 2022-05-24 NOTE — Transfer of Care (Signed)
Immediate Anesthesia Transfer of Care Note  Patient: Tonya Mueller  Procedure(s) Performed: IMAGE GUIDED SINUS SURGERY (Left: Nose) MAXILLARY ANTROSTOMY WITH TISSUE REMOVAL (Left: Nose) ETHMOIDECTOMY- TOTAL WITH FRONTAL SINUS EXPLORATION (Left: Nose)  Patient Location: PACU  Anesthesia Type: General ETT  Level of Consciousness: awake, alert  and patient cooperative  Airway and Oxygen Therapy: Patient Spontanous Breathing and Patient connected to supplemental oxygen  Post-op Assessment: Post-op Vital signs reviewed, Patient's Cardiovascular Status Stable, Respiratory Function Stable, Patent Airway and No signs of Nausea or vomiting  Post-op Vital Signs: Reviewed and stable  Complications: No notable events documented.

## 2022-05-25 ENCOUNTER — Encounter: Payer: Self-pay | Admitting: Otolaryngology

## 2022-05-26 LAB — SURGICAL PATHOLOGY

## 2022-05-29 LAB — AEROBIC/ANAEROBIC CULTURE W GRAM STAIN (SURGICAL/DEEP WOUND)

## 2022-06-07 ENCOUNTER — Ambulatory Visit
Admission: RE | Admit: 2022-06-07 | Discharge: 2022-06-07 | Disposition: A | Discharge status: Home / Self Care | Payer: BC Managed Care – PPO | Source: Ambulatory Visit | Attending: Gerontology | Admitting: Gerontology

## 2022-06-07 DIAGNOSIS — Z1231 Encounter for screening mammogram for malignant neoplasm of breast: Secondary | ICD-10-CM | POA: Insufficient documentation

## 2023-01-25 ENCOUNTER — Encounter: Payer: Self-pay | Admitting: Emergency Medicine

## 2023-01-25 ENCOUNTER — Emergency Department: Payer: BC Managed Care – PPO

## 2023-01-25 ENCOUNTER — Emergency Department
Admission: EM | Admit: 2023-01-25 | Discharge: 2023-01-25 | Disposition: A | Discharge status: Home / Self Care | Payer: BC Managed Care – PPO | Attending: Emergency Medicine | Admitting: Emergency Medicine

## 2023-01-25 ENCOUNTER — Other Ambulatory Visit: Payer: Self-pay

## 2023-01-25 DIAGNOSIS — E119 Type 2 diabetes mellitus without complications: Secondary | ICD-10-CM | POA: Diagnosis not present

## 2023-01-25 DIAGNOSIS — W19XXXA Unspecified fall, initial encounter: Secondary | ICD-10-CM | POA: Diagnosis not present

## 2023-01-25 DIAGNOSIS — S4992XA Unspecified injury of left shoulder and upper arm, initial encounter: Secondary | ICD-10-CM | POA: Diagnosis present

## 2023-01-25 DIAGNOSIS — M778 Other enthesopathies, not elsewhere classified: Secondary | ICD-10-CM | POA: Insufficient documentation

## 2023-01-25 DIAGNOSIS — S46912A Strain of unspecified muscle, fascia and tendon at shoulder and upper arm level, left arm, initial encounter: Secondary | ICD-10-CM | POA: Diagnosis not present

## 2023-01-25 MED ORDER — HYDROCODONE-ACETAMINOPHEN 5-325 MG PO TABS: 1.0000 | ORAL_TABLET | Freq: Four times a day (QID) | ORAL | 0 refills | Status: AC | PRN

## 2023-01-25 MED ORDER — TIZANIDINE HCL 4 MG PO TABS: 4.0000 mg | ORAL_TABLET | Freq: Three times a day (TID) | ORAL | 0 refills | Status: DC

## 2023-01-25 MED ORDER — KETOROLAC TROMETHAMINE 60 MG/2ML IM SOLN
30.0000 mg | Freq: Once | INTRAMUSCULAR | Status: AC
  Administered 2023-01-25: 30 mg via INTRAMUSCULAR
  Filled 2023-01-25: qty 2

## 2023-01-25 NOTE — ED Notes (Signed)
See triage note  Presents with left shoulder pain  states she fell about 1 month ago  States she had pain at shoulder/ scapula area

## 2023-01-25 NOTE — Discharge Instructions (Signed)
Take your hydrocodone, meloxicam, and gabapentin as prescribed.  Do not take the hydrocodone and Zanaflex at the same time, please separate them by at least 2 hours.  Please call and schedule an appointment with your orthopedist at [facility].  Also, they have an urgent care where you may be evaluated sooner.  Return to the emergency department for symptoms that change or worsen if you are unable to schedule an appointment.

## 2023-01-25 NOTE — ED Provider Notes (Signed)
[facility] Provider Note    Event Date/Time   First MD Initiated Contact with Patient 01/25/23 [phone]     (approximate)   History   Shoulder Pain   HPI  Tonya Mueller is a 57 y.o. female GERD, type 2 diabetes, and as listed in EMR presents to the emergency department for treatment and evaluation of left shoulder/neck pain.  She fell about a month ago and had pain in the scapular area, but this pain is different.  No associated injury with current complaint.  No relief with meloxicam, gabapentin, or Norco.  Pain only occurs with movement.  She has had no associated chest pain or shortness of breath.  She has a history of a pinched nerve between C4 and C5.  This pain does not feel the same.Marland Kitchen      Physical Exam   Triage Vital Signs: ED Triage Vitals  Enc Vitals Group     BP 01/25/23 0654 (!) 127/93     Pulse Rate 01/25/23 0654 88     Resp 01/25/23 0654 18     Temp 01/25/23 0654 98.2 F (36.8 C)     Temp Source 01/25/23 0654 Oral     SpO2 01/25/23 0654 96 %     Weight 01/25/23 0659 200 lb (90.7 kg)     Height 01/25/23 0659 5\' 1"  (1.549 m)     Head Circumference --      Peak Flow --      Pain Score 01/25/23 0659 10     Pain Loc --      Pain Edu? --      Excl. in GC? --     Most recent vital signs: Vitals:   01/25/23 0654  BP: (!) 127/93  Pulse: 88  Resp: 18  Temp: 98.2 F (36.8 C)  SpO2: 96%    General: Awake, no distress.  CV:  Good peripheral perfusion.  Resp:  Normal effort.  Abd:  No distention.  Other:  Focal area of tenderness over the trapezius/supraspinatus area of the left neck/shoulder.   ED Results / Procedures / Treatments   Labs (all labs ordered are listed, but only abnormal results are displayed) Labs Reviewed - No data to display   EKG  Not indicated.   RADIOLOGY  Image and radiology report reviewed and interpreted by me. Radiology report consistent with the same.  Image of the left shoulder shows no  acute findings.  She does have mild rotator cuff enthesopathy.  PROCEDURES:  Critical Care performed: No  Procedures   MEDICATIONS ORDERED IN ED:  Medications  ketorolac (TORADOL) injection 30 mg (30 mg Intramuscular Given 01/25/23 0809)     IMPRESSION / MDM / ASSESSMENT AND PLAN / ED COURSE   I have reviewed the triage note.  Differential diagnosis includes, but is not limited to, muscle strain, contusion, hematoma, fracture, cardiac  Patient's presentation is most consistent with acute illness / injury with system symptoms.  57 year old female presenting to the emergency department for treatment and evaluation of left shoulder/neck pain that has been ongoing since yesterday.  See HPI for further details.  On exam, she has a focal area of tenderness over the trapezius/supraspinatus muscle that hurts with any abduction or rotation of the shoulder.  X-ray shows no acute bony abnormality however she does have some evidence of rotator cuff damage.  Results discussed with the patient.  Plan will be to give her a prescription for Zanaflex.  She already has hydrocodone  and meloxicam at home.  She will be encouraged to take these medications as previously prescribed.  She states that she has an orthopedic specialist and will call to schedule an appointment.      FINAL CLINICAL IMPRESSION(S) / ED DIAGNOSES   Final diagnoses:  Shoulder strain, left, initial encounter  Enthesopathy of left shoulder     Rx / DC Orders   ED Discharge Orders          Ordered    tiZANidine (ZANAFLEX) 4 MG tablet  3 times daily        01/25/23 1610             Note:  This document was prepared using Dragon voice recognition software and may include unintentional dictation errors.   Chinita Pester, FNP 01/25/23 [id number]    Sharman Cheek, MD 01/26/23 [phone]

## 2023-01-25 NOTE — ED Triage Notes (Addendum)
Patient ambulatory to triage with steady gait, without difficulty or distress noted; pt reports fell a mo ago injuring left scapula but was never seen; cont to have pain to left shoulder with ROM; denies any accomp symptoms

## 2023-02-24 ENCOUNTER — Other Ambulatory Visit: Payer: Self-pay | Admitting: Physical Medicine & Rehabilitation

## 2023-02-24 DIAGNOSIS — M542 Cervicalgia: Secondary | ICD-10-CM

## 2023-03-07 ENCOUNTER — Encounter: Payer: Self-pay | Admitting: Physical Medicine & Rehabilitation

## 2023-03-09 ENCOUNTER — Ambulatory Visit
Admission: RE | Admit: 2023-03-09 | Discharge: 2023-03-09 | Disposition: A | Discharge status: Home / Self Care | Payer: BC Managed Care – PPO | Source: Ambulatory Visit | Attending: Physical Medicine & Rehabilitation | Admitting: Physical Medicine & Rehabilitation

## 2023-03-09 DIAGNOSIS — M542 Cervicalgia: Secondary | ICD-10-CM

## 2023-03-28 ENCOUNTER — Inpatient Hospital Stay
Admission: RE | Admit: 2023-03-28 | Discharge: 2023-03-28 | Disposition: A | Discharge status: Home / Self Care | Payer: Self-pay | Source: Ambulatory Visit | Attending: Neurosurgery | Admitting: Neurosurgery

## 2023-03-28 ENCOUNTER — Other Ambulatory Visit: Payer: Self-pay

## 2023-03-28 DIAGNOSIS — Z049 Encounter for examination and observation for unspecified reason: Secondary | ICD-10-CM

## 2023-03-28 NOTE — Progress Notes (Deleted)
Referring Physician:  Elijah Birk, MD [address] [place],  [place] [address]  Primary Physician:  Luciana Axe, NP  History of Present Illness: 03/28/2023 [name] is here today with a chief complaint of ***  neck pain traveling down the left arm.    Duration: onset 2022, worse over 1-2 months? Location: *** Quality: ***sharp, dull, tight.  Severity: *** 10/10 Precipitating: aggravated by ***bending, lifting, twisting  Modifying factors: made better by ***rest Weakness: none Timing: ***constant Bowel/Bladder Dysfunction: none  Conservative measures:  Physical therapy: *** 2023?  Multimodal medical therapy including regular antiinflammatories: *** gabapentin, oxycodone, prednisone, tizanidine  Injections: *** has received epidural steroid injections left C6-7 TFESI 01/30/2023 (no significant relief) left C6-7 TFESI 10/29/2021 (50% relief) left subacromial bursa injection 11/18/2021 (50% improvement)  Past Surgery: ***none  Tonya Mueller has ***no symptoms of cervical myelopathy.  The symptoms are causing a significant impact on the patient's life.   I have utilized the care everywhere function in epic to review the outside records available from external health systems.  Review of Systems:  A 10 point review of systems is negative, except for the pertinent positives and negatives detailed in the HPI.  Past Medical History: Past Medical History:  Diagnosis Date   GERD (gastroesophageal reflux disease)    Type 2 diabetes mellitus ([facility])     Past Surgical History: Past Surgical History:  Procedure Laterality Date   BREAST EXCISIONAL BIOPSY Left 2004-2005   benign   CESAREAN SECTION  2006   ETHMOIDECTOMY Left 05/24/2022   Procedure: ETHMOIDECTOMY- TOTAL WITH FRONTAL SINUS EXPLORATION;  Surgeon: Geanie Logan, MD;  Location: [facility];  Service: ENT;  Laterality: Left;  Diabetic   GALLBLADDER SURGERY     IMAGE GUIDED SINUS  SURGERY Left 05/24/2022   Procedure: IMAGE GUIDED SINUS SURGERY;  Surgeon: Geanie Logan, MD;  Location: [facility];  Service: ENT;  Laterality: Left;  PLACED DISK ON OR CHARGE NURSE DESK 8-23 KP   MAXILLARY ANTROSTOMY Left 05/24/2022   Procedure: MAXILLARY ANTROSTOMY WITH TISSUE REMOVAL;  Surgeon: Geanie Logan, MD;  Location: [facility];  Service: ENT;  Laterality: Left;    Allergies: Allergies as of 03/30/2023 - Review Complete 01/25/2023  Allergen Reaction Noted   Cortisone Other (See Comments) 12/06/2013    Medications:  Current Outpatient Medications:    atorvastatin (LIPITOR) 20 MG tablet, Take 20 mg by mouth daily., Disp: , Rfl:    DULoxetine (CYMBALTA) 60 MG capsule, Take 60 mg by mouth daily., Disp: , Rfl:    fluticasone (FLONASE) 50 MCG/ACT nasal spray, Place into both nostrils daily., Disp: , Rfl:    gabapentin (NEURONTIN) 100 MG capsule, Take 100 mg by mouth at bedtime., Disp: , Rfl:    ipratropium (ATROVENT) 0.03 % nasal spray, Place 2 sprays into both nostrils every 12 (twelve) hours., Disp: , Rfl:    levocetirizine (XYZAL) 5 MG tablet, Take 5 mg by mouth every evening., Disp: , Rfl:    metFORMIN (GLUCOPHAGE) 500 MG tablet, Take 500 mg by mouth daily with supper., Disp: , Rfl:    omeprazole (PRILOSEC) 20 MG capsule, Take 20 mg by mouth daily., Disp: , Rfl:    tiZANidine (ZANAFLEX) 4 MG tablet, Take 1 tablet (4 mg total) by mouth 3 (three) times daily., Disp: 30 tablet, Rfl: 0  Social History: Social History   Tobacco Use   Smoking status: Never   Smokeless tobacco: Never  Vaping Use   Vaping  status: Never Used  Substance Use Topics   Alcohol use: No   Drug use: No    Family Medical History: Family History  Problem Relation Age of Onset   Breast cancer Neg Hx     Physical Examination: There were no vitals filed for this visit.  General: Patient is in no apparent distress. Attention to examination is appropriate.  Neck:   Supple.  Full  range of motion.  Respiratory: Patient is breathing without any difficulty.   NEUROLOGICAL:     Awake, alert, oriented to person, place, and time.  Speech is clear and fluent.   Cranial Nerves: Pupils equal round and reactive to light.  Facial tone is symmetric.  Facial sensation is symmetric. Shoulder shrug is symmetric. Tongue protrusion is midline.    Strength: Side Biceps Triceps Deltoid Interossei Grip Wrist Ext. Wrist Flex.  R 5 5 5 5 5 5 5   L 5 5 5 5 5 5 5    Side Iliopsoas Quads Hamstring PF DF EHL  R 5 5 5 5 5 5   L 5 5 5 5 5 5    Reflexes are ***2+ and symmetric at the biceps, triceps, brachioradialis, patella and achilles.   Hoffman's is absent. Clonus is absent  Bilateral upper and lower extremity sensation is intact to light touch ***.     No evidence of dysmetria noted.  Gait is normal.    Imaging: *** I have personally reviewed the images and agree with the above interpretation.  Medical Decision Making/Assessment and Plan: Ms. Alarid is a pleasant 57 y.o. female with ***  There are no diagnoses linked to this encounter.   Thank you for involving me in the care of this patient.    Lovenia Kim MD/MSCR Neurosurgery

## 2023-03-30 ENCOUNTER — Ambulatory Visit: Payer: BC Managed Care – PPO | Admitting: Neurosurgery

## 2023-04-04 NOTE — H&P (View-Only) (Signed)
Referring Physician:  Elijah Birk, MD [address] [place],  [place] [address]  Primary Physician:  Luciana Axe, NP  History of Present Illness: 04/04/2023 [name] is here today with a chief complaint of left upper extremity pain and weakness.  She was working when she was lifting a box approximately 2 months ago and felt immediate pain and weakness in her left upper extremity.  Radiated from her head and neck down the left arm and back of the arm.  Also goes to the posterior medial scapular area.  Her pain and numbness radiates down to her the back of her hand.  She gets severe symptoms when tilting her head back.  She often will get electrical-like sensations when she coughs or bears down.  She feels like her strength in her left arm is approximately half that of her right.  She had a previous episode with much less weakness but similar pain which eventually resolved in 2022, however this exacerbation involves weakness as well as severe pain.  She has some improvement with laying down.  She notices worsening with bending lifting twisting straining coughing and sneezing.  Conservative measures:  Physical therapy:  2023 discharged, started last week again doing dry needle Multimodal medical therapy including regular antiinflammatories:  gabapentin, oxycodone, prednisone, tizanidine  Injections:  has received epidural steroid injections left C6-7 TFESI 01/30/2023 (no significant relief) left C6-7 TFESI 10/29/2021 (50% relief) left subacromial bursa injection 11/18/2021 (50% improvement)  Past Surgery: none  The symptoms are causing a significant impact on the patient's life.   I have utilized the care everywhere function in epic to review the outside records available from external health systems.  Review of Systems:  A 10 point review of systems is negative, except for the pertinent positives and negatives detailed in the HPI.  Past Medical History: Past  Medical History:  Diagnosis Date   GERD (gastroesophageal reflux disease)    Type 2 diabetes mellitus (HCC)     Past Surgical History: Past Surgical History:  Procedure Laterality Date   BREAST EXCISIONAL BIOPSY Left 2004-2005   benign   CESAREAN SECTION  2006   ETHMOIDECTOMY Left 05/24/2022   Procedure: ETHMOIDECTOMY- TOTAL WITH FRONTAL SINUS EXPLORATION;  Surgeon: Geanie Logan, MD;  Location: [facility];  Service: ENT;  Laterality: Left;  Diabetic   GALLBLADDER SURGERY     IMAGE GUIDED SINUS SURGERY Left 05/24/2022   Procedure: IMAGE GUIDED SINUS SURGERY;  Surgeon: Geanie Logan, MD;  Location: [facility];  Service: ENT;  Laterality: Left;  PLACED DISK ON OR CHARGE NURSE DESK 8-23 KP   MAXILLARY ANTROSTOMY Left 05/24/2022   Procedure: MAXILLARY ANTROSTOMY WITH TISSUE REMOVAL;  Surgeon: Geanie Logan, MD;  Location: [facility];  Service: ENT;  Laterality: Left;    Allergies: Allergies as of 04/05/2023 - Review Complete 01/25/2023  Allergen Reaction Noted   Cortisone Other (See Comments) 12/06/2013    Medications:  Current Outpatient Medications:    atorvastatin (LIPITOR) 20 MG tablet, Take 20 mg by mouth daily., Disp: , Rfl:    DULoxetine (CYMBALTA) 60 MG capsule, Take 60 mg by mouth daily., Disp: , Rfl:    fluticasone (FLONASE) 50 MCG/ACT nasal spray, Place into both nostrils daily., Disp: , Rfl:    gabapentin (NEURONTIN) 100 MG capsule, Take 100 mg by mouth at bedtime., Disp: , Rfl:    ipratropium (ATROVENT) 0.03 % nasal spray, Place 2 sprays into both nostrils every 12 (twelve) hours., Disp: ,  Rfl:    levocetirizine (XYZAL) 5 MG tablet, Take 5 mg by mouth every evening., Disp: , Rfl:    metFORMIN (GLUCOPHAGE) 500 MG tablet, Take 500 mg by mouth daily with supper., Disp: , Rfl:    omeprazole (PRILOSEC) 20 MG capsule, Take 20 mg by mouth daily., Disp: , Rfl:    tiZANidine (ZANAFLEX) 4 MG tablet, Take 1 tablet (4 mg total) by mouth 3 (three) times  daily., Disp: 30 tablet, Rfl: 0  Social History: Social History   Tobacco Use   Smoking status: Never   Smokeless tobacco: Never  Vaping Use   Vaping status: Never Used  Substance Use Topics   Alcohol use: No   Drug use: No    Family Medical History: Family History  Problem Relation Age of Onset   Breast cancer Neg Hx     Physical Examination: There were no vitals filed for this visit.  General: Patient is in no apparent distress. Attention to examination is appropriate.  At baseline she prefers to have her left hand over her head.  Neck:   Supple.  Full range of motion.  Respiratory: Patient is breathing without any difficulty.   NEUROLOGICAL:     Awake, alert, oriented to person, place, and time.  Speech is clear and fluent.   Cranial Nerves: Pupils equal round and reactive to light.  Facial tone is symmetric.  Facial sensation is symmetric. Shoulder shrug is symmetric. Tongue protrusion is midline.    Strength: Side Biceps Triceps Deltoid Interossei Grip Wrist Ext. Wrist Flex.  R 5 5 5 5 5 5 5   L 4+ 4- Painful giveway 4+ 4+ 4 5   Side Iliopsoas Quads Hamstring PF DF EHL  R 5 5 5 5 5 5   L 5 5 5 5 5 5    Reflexes are 2+ in the right upper extremity, absent at the left deltoid and diminished at the left bicep.  Left upper extremity shows diminished sensation in the C7 dermatome, she has a cut on the medial aspect of her index finger which she did not notice while she was cooking.     No evidence of dysmetria noted.  Gait is normal.    Imaging: Narrative & Impression  CLINICAL DATA:  Initial evaluation for left-sided neck pain extending into the left upper extremity.   EXAM: MRI CERVICAL SPINE WITHOUT CONTRAST   TECHNIQUE: Multiplanar, multisequence MR imaging of the cervical spine was performed. No intravenous contrast was administered.   COMPARISON:  Prior MRI from 09/25/2021.   FINDINGS: Alignment: Straightening with slight reversal of the normal  cervical lordosis. No listhesis.   Vertebrae: Vertebral body height maintained without acute or chronic fracture. No worrisome osseous lesions. No abnormal marrow edema.   Cord: Normal signal and morphology.   Posterior Fossa, vertebral arteries, paraspinal tissues: Visualized brain and posterior fossa within normal limits. Craniocervical junction normal. Mild edema about the spinous process of C7, likely mild bursitis (series 108, image 9). Paraspinous soft tissues otherwise unremarkable. Normal flow voids seen within the vertebral arteries bilaterally.   Disc levels:   C2-C3: Unremarkable.   C3-C4: Mild disc bulge with uncovertebral spurring. Flattening and partial effacement of the ventral thecal sac with mild spinal stenosis. Foramina remain patent.   C4-C5: Mild disc bulge with uncovertebral spurring. Flattening of the ventral thecal sac with resultant mild spinal stenosis. Foramina remain patent.   C5-C6: Mild disc bulge with uncovertebral spurring. Flattening of the ventral thecal sac with no more than mild spinal  stenosis. Foramina remain patent.   C6-C7: Left paracentral to foraminal disc protrusion (series 109, image 24). This appears slightly increased in size from previous. Mild spinal stenosis with moderate to severe left C7 foraminal narrowing. Right neural foramen remains patent.   C7-T1: Negative interspace. Minimal facet hypertrophy. No canal or foraminal stenosis.   IMPRESSION: 1. Left paracentral to foraminal disc protrusion at C6-7, slightly increased in size as compared to 09/25/2021. Query left C7 radiculitis. 2. Mild disc bulging with uncovertebral spurring at C3-4 through C5-6 with resultant mild diffuse spinal stenosis. 3. Mild edema about the spinous process of C7, likely mild bursitis.     Electronically Signed   By: Rise Mu M.D.   On: 03/12/2023 22:41        I have personally reviewed the images and agree with the above  interpretation.Notably the disc fragment is medial to the point where I feel that a posterior approach would be somewhat dangerous.  Medical Decision Making/Assessment and Plan: Cervical disc disorder with radiculopathy of cervical region Left arm weakness   Ms. Teeple is a pleasant 57 y.o. female with a prior cervical radiculopathy which recovered but then 2 months ago has a new onset of left upper extremity weakness, numbness, and severe pain.  She was lifting something above her head when she felt an immediate exacerbation of electrical-like sensations down the left arm and on the left medial scapula.  She states that she has felt weak ever since that time.  She prefers to hold her hand over her head to get some relief.  She has lost sensation in her hand and has cut her finger without noticing it.  She does have some neck pain which is mostly central.  On physical examination she has a positive Spurling sign, significant weakness in the left upper extremity especially in elbow extension which is 4 -, she also has some biceps weakness approximately 4 4+ out of 5, her deltoid is difficult to examine given the severe pain with resistance at this level however the muscles are actively contracting strongly.  Wrist extension is involved approximately 4+ out of 5.  Her sensory exam shows decreased sensation in her C7 dermatome, her reflex at the triceps is absent and diminished at the biceps.  Given her significant weakness without improvement over the past 2 months, she has had previous injections without improvement.  She had prior cervical physical therapy before this event.  However given her significant weakness in the left upper extremity with a large paracentral disc herniation we feel that she will benefit from a decompression and fusion at C6-7.  We discussed the possibility of a posterior approach, however a majority of the fragment is in the paramedian space increasing the risk of direct spinal cord  injury.  We discussed risk and benefits of surgery.  Given her weakness and severe pain she would like to go forward with surgery.  Will plan on getting a CT scan to check for calcification and a cervical flexion-extension scan to evaluate for any excess mobility had any other adjacent levels for surgical planning.  Thank you for involving me in the care of this patient.    Lovenia Kim MD/MSCR Neurosurgery

## 2023-04-04 NOTE — Progress Notes (Unsigned)
Referring Physician:  Elijah Birk, MD [address] [place],  [place] [address]  Primary Physician:  Luciana Axe, NP  History of Present Illness: 04/04/2023 [name] is here today with a chief complaint of left upper extremity pain and weakness.  She was working when she was lifting a box approximately 2 months ago and felt immediate pain and weakness in her left upper extremity.  Radiated from her head and neck down the left arm and back of the arm.  Also goes to the posterior medial scapular area.  Her pain and numbness radiates down to her the back of her hand.  She gets severe symptoms when tilting her head back.  She often will get electrical-like sensations when she coughs or bears down.  She feels like her strength in her left arm is approximately half that of her right.  She had a previous episode with much less weakness but similar pain which eventually resolved in 2022, however this exacerbation involves weakness as well as severe pain.  She has some improvement with laying down.  She notices worsening with bending lifting twisting straining coughing and sneezing.  Conservative measures:  Physical therapy:  2023 discharged, started last week again doing dry needle Multimodal medical therapy including regular antiinflammatories:  gabapentin, oxycodone, prednisone, tizanidine  Injections:  has received epidural steroid injections left C6-7 TFESI 01/30/2023 (no significant relief) left C6-7 TFESI 10/29/2021 (50% relief) left subacromial bursa injection 11/18/2021 (50% improvement)  Past Surgery: none  The symptoms are causing a significant impact on the patient's life.   I have utilized the care everywhere function in epic to review the outside records available from external health systems.  Review of Systems:  A 10 point review of systems is negative, except for the pertinent positives and negatives detailed in the HPI.  Past Medical History: Past  Medical History:  Diagnosis Date   GERD (gastroesophageal reflux disease)    Type 2 diabetes mellitus (HCC)     Past Surgical History: Past Surgical History:  Procedure Laterality Date   BREAST EXCISIONAL BIOPSY Left 2004-2005   benign   CESAREAN SECTION  2006   ETHMOIDECTOMY Left 05/24/2022   Procedure: ETHMOIDECTOMY- TOTAL WITH FRONTAL SINUS EXPLORATION;  Surgeon: Geanie Logan, MD;  Location: [facility];  Service: ENT;  Laterality: Left;  Diabetic   GALLBLADDER SURGERY     IMAGE GUIDED SINUS SURGERY Left 05/24/2022   Procedure: IMAGE GUIDED SINUS SURGERY;  Surgeon: Geanie Logan, MD;  Location: [facility];  Service: ENT;  Laterality: Left;  PLACED DISK ON OR CHARGE NURSE DESK 8-23 KP   MAXILLARY ANTROSTOMY Left 05/24/2022   Procedure: MAXILLARY ANTROSTOMY WITH TISSUE REMOVAL;  Surgeon: Geanie Logan, MD;  Location: [facility];  Service: ENT;  Laterality: Left;    Allergies: Allergies as of 04/05/2023 - Review Complete 01/25/2023  Allergen Reaction Noted   Cortisone Other (See Comments) 12/06/2013    Medications:  Current Outpatient Medications:    atorvastatin (LIPITOR) 20 MG tablet, Take 20 mg by mouth daily., Disp: , Rfl:    DULoxetine (CYMBALTA) 60 MG capsule, Take 60 mg by mouth daily., Disp: , Rfl:    fluticasone (FLONASE) 50 MCG/ACT nasal spray, Place into both nostrils daily., Disp: , Rfl:    gabapentin (NEURONTIN) 100 MG capsule, Take 100 mg by mouth at bedtime., Disp: , Rfl:    ipratropium (ATROVENT) 0.03 % nasal spray, Place 2 sprays into both nostrils every 12 (twelve) hours., Disp: ,  Rfl:    levocetirizine (XYZAL) 5 MG tablet, Take 5 mg by mouth every evening., Disp: , Rfl:    metFORMIN (GLUCOPHAGE) 500 MG tablet, Take 500 mg by mouth daily with supper., Disp: , Rfl:    omeprazole (PRILOSEC) 20 MG capsule, Take 20 mg by mouth daily., Disp: , Rfl:    tiZANidine (ZANAFLEX) 4 MG tablet, Take 1 tablet (4 mg total) by mouth 3 (three) times  daily., Disp: 30 tablet, Rfl: 0  Social History: Social History   Tobacco Use   Smoking status: Never   Smokeless tobacco: Never  Vaping Use   Vaping status: Never Used  Substance Use Topics   Alcohol use: No   Drug use: No    Family Medical History: Family History  Problem Relation Age of Onset   Breast cancer Neg Hx     Physical Examination: There were no vitals filed for this visit.  General: Patient is in no apparent distress. Attention to examination is appropriate.  At baseline she prefers to have her left hand over her head.  Neck:   Supple.  Full range of motion.  Respiratory: Patient is breathing without any difficulty.   NEUROLOGICAL:     Awake, alert, oriented to person, place, and time.  Speech is clear and fluent.   Cranial Nerves: Pupils equal round and reactive to light.  Facial tone is symmetric.  Facial sensation is symmetric. Shoulder shrug is symmetric. Tongue protrusion is midline.    Strength: Side Biceps Triceps Deltoid Interossei Grip Wrist Ext. Wrist Flex.  R 5 5 5 5 5 5 5   L 4+ 4- Painful giveway 4+ 4+ 4 5   Side Iliopsoas Quads Hamstring PF DF EHL  R 5 5 5 5 5 5   L 5 5 5 5 5 5    Reflexes are 2+ in the right upper extremity, absent at the left deltoid and diminished at the left bicep.  Left upper extremity shows diminished sensation in the C7 dermatome, she has a cut on the medial aspect of her index finger which she did not notice while she was cooking.     No evidence of dysmetria noted.  Gait is normal.    Imaging: Narrative & Impression  CLINICAL DATA:  Initial evaluation for left-sided neck pain extending into the left upper extremity.   EXAM: MRI CERVICAL SPINE WITHOUT CONTRAST   TECHNIQUE: Multiplanar, multisequence MR imaging of the cervical spine was performed. No intravenous contrast was administered.   COMPARISON:  Prior MRI from 09/25/2021.   FINDINGS: Alignment: Straightening with slight reversal of the normal  cervical lordosis. No listhesis.   Vertebrae: Vertebral body height maintained without acute or chronic fracture. No worrisome osseous lesions. No abnormal marrow edema.   Cord: Normal signal and morphology.   Posterior Fossa, vertebral arteries, paraspinal tissues: Visualized brain and posterior fossa within normal limits. Craniocervical junction normal. Mild edema about the spinous process of C7, likely mild bursitis (series 108, image 9). Paraspinous soft tissues otherwise unremarkable. Normal flow voids seen within the vertebral arteries bilaterally.   Disc levels:   C2-C3: Unremarkable.   C3-C4: Mild disc bulge with uncovertebral spurring. Flattening and partial effacement of the ventral thecal sac with mild spinal stenosis. Foramina remain patent.   C4-C5: Mild disc bulge with uncovertebral spurring. Flattening of the ventral thecal sac with resultant mild spinal stenosis. Foramina remain patent.   C5-C6: Mild disc bulge with uncovertebral spurring. Flattening of the ventral thecal sac with no more than mild spinal  stenosis. Foramina remain patent.   C6-C7: Left paracentral to foraminal disc protrusion (series 109, image 24). This appears slightly increased in size from previous. Mild spinal stenosis with moderate to severe left C7 foraminal narrowing. Right neural foramen remains patent.   C7-T1: Negative interspace. Minimal facet hypertrophy. No canal or foraminal stenosis.   IMPRESSION: 1. Left paracentral to foraminal disc protrusion at C6-7, slightly increased in size as compared to 09/25/2021. Query left C7 radiculitis. 2. Mild disc bulging with uncovertebral spurring at C3-4 through C5-6 with resultant mild diffuse spinal stenosis. 3. Mild edema about the spinous process of C7, likely mild bursitis.     Electronically Signed   By: Rise Mu M.D.   On: 03/12/2023 22:41        I have personally reviewed the images and agree with the above  interpretation.Notably the disc fragment is medial to the point where I feel that a posterior approach would be somewhat dangerous.  Medical Decision Making/Assessment and Plan: Cervical disc disorder with radiculopathy of cervical region Left arm weakness   Ms. Assad is a pleasant 57 y.o. female with a prior cervical radiculopathy which recovered but then 2 months ago has a new onset of left upper extremity weakness, numbness, and severe pain.  She was lifting something above her head when she felt an immediate exacerbation of electrical-like sensations down the left arm and on the left medial scapula.  She states that she has felt weak ever since that time.  She prefers to hold her hand over her head to get some relief.  She has lost sensation in her hand and has cut her finger without noticing it.  She does have some neck pain which is mostly central.  On physical examination she has a positive Spurling sign, significant weakness in the left upper extremity especially in elbow extension which is 4 -, she also has some biceps weakness approximately 4 4+ out of 5, her deltoid is difficult to examine given the severe pain with resistance at this level however the muscles are actively contracting strongly.  Wrist extension is involved approximately 4+ out of 5.  Her sensory exam shows decreased sensation in her C7 dermatome, her reflex at the triceps is absent and diminished at the biceps.  Given her significant weakness without improvement over the past 2 months, she has had previous injections without improvement.  She had prior cervical physical therapy before this event.  However given her significant weakness in the left upper extremity with a large paracentral disc herniation we feel that she will benefit from a decompression and fusion at C6-7.  We discussed the possibility of a posterior approach, however a majority of the fragment is in the paramedian space increasing the risk of direct spinal cord  injury.  We discussed risk and benefits of surgery.  Given her weakness and severe pain she would like to go forward with surgery.  Will plan on getting a CT scan to check for calcification and a cervical flexion-extension scan to evaluate for any excess mobility had any other adjacent levels for surgical planning.  Thank you for involving me in the care of this patient.    Lovenia Kim MD/MSCR Neurosurgery

## 2023-04-05 ENCOUNTER — Ambulatory Visit (INDEPENDENT_AMBULATORY_CARE_PROVIDER_SITE_OTHER): Payer: BC Managed Care – PPO | Admitting: Neurosurgery

## 2023-04-05 ENCOUNTER — Other Ambulatory Visit: Payer: Self-pay

## 2023-04-05 ENCOUNTER — Ambulatory Visit
Admission: RE | Admit: 2023-04-05 | Discharge: 2023-04-05 | Disposition: A | Discharge status: Home / Self Care | Payer: BC Managed Care – PPO | Attending: Neurosurgery | Admitting: Neurosurgery

## 2023-04-05 ENCOUNTER — Ambulatory Visit
Admission: RE | Admit: 2023-04-05 | Discharge: 2023-04-05 | Disposition: A | Discharge status: Home / Self Care | Payer: BC Managed Care – PPO | Source: Ambulatory Visit | Attending: Neurosurgery | Admitting: Neurosurgery

## 2023-04-05 ENCOUNTER — Telehealth: Payer: Self-pay | Admitting: Neurosurgery

## 2023-04-05 ENCOUNTER — Encounter: Payer: Self-pay | Admitting: Neurosurgery

## 2023-04-05 VITALS — BP 124/84 | Ht 61.0 in | Wt 222.0 lb

## 2023-04-05 DIAGNOSIS — M50123 Cervical disc disorder at C6-C7 level with radiculopathy: Secondary | ICD-10-CM | POA: Diagnosis not present

## 2023-04-05 DIAGNOSIS — R29898 Other symptoms and signs involving the musculoskeletal system: Secondary | ICD-10-CM | POA: Insufficient documentation

## 2023-04-05 DIAGNOSIS — M5010 Cervical disc disorder with radiculopathy, unspecified cervical region: Secondary | ICD-10-CM

## 2023-04-05 DIAGNOSIS — Z01818 Encounter for other preprocedural examination: Secondary | ICD-10-CM

## 2023-04-05 NOTE — Telephone Encounter (Signed)
She was seen today and forgot to discuss with Dr. Katrinka Blazing if he can take over her FMLA. She has been out of work for about 2 months now. Dr. Mariah Milling kept her out of work until she saw neurosurgery. I have the FMLA forms here do you agree to take over her disability starting today?

## 2023-04-05 NOTE — Patient Instructions (Addendum)
Go to [facility] today for xrays   Please call radiology scheduling to schedule your CT cervical spine - [phone]   OK to have colonoscopy 1.5 months after surgery   Please see below for information in regards to your upcoming surgery:   Planned surgery: C6-7 anterior cervical discectomy and fusion   Surgery date: 04/25/23 at [facility] (Medical Mall: [address], [place], [place] [address]) - you will find out your arrival time the business day before your surgery.   Pre-op appointment at [facility] Pre-admit Testing: we will call you with a date/time for this. If you are scheduled for an in person appointment, Pre-admit Testing is located on the first floor of the [facility] building, [address], [address]. Please bring all prescriptions in the original prescription bottles to your appointment, even if you have reviewed medications by phone with a pharmacy representative. During this appointment, they will advise you which medications you can take the morning of surgery, and which medications you will need to hold for surgery. Pre-op labs may be done at your pre-op appointment. You are not required to fast for these labs. Should you need to change your pre-op appointment, please call Pre-admit testing at [phone].    Diabetes medications:  Ozempic injection: hold for 7 days prior to surgery     NSAIDS (Non-steroidal anti-inflammatory drugs): because you are having a fusion, please avoid taking any NSAIDS (examples: ibuprofen, motrin, aleve, naproxen, meloxicam, diclofenac) for 3 months after surgery. Celebrex is an exception and is OK to take, if prescribed. Tylenol is not an NSAID.    Common restrictions after surgery: No bending, lifting, or twisting ("BLT"). Avoid lifting objects heavier than 10 pounds for the first 6 weeks after surgery. Where possible, avoid household activities that involve lifting, bending,  reaching, pushing, or pulling such as laundry, vacuuming, grocery shopping, and childcare. Try to arrange for help from friends and family for these activities while you heal. Do not drive while taking prescription pain medication. Weeks 6 through 12 after surgery: avoid lifting more than 25 pounds.    X-rays after surgery: Because you are having a fusion or arthroplasty: for appointments after your 2 week follow-up: please arrive at the [facility] outpatient imaging center ([address], [address], [place]) or [employer] one hour prior to your appointment for x-rays. This applies to every appointment after your 2 week follow-up. Failure to do so may result in your appointment being rescheduled.   How to contact us:  If you have any questions/concerns before or after surgery, you can reach [place] at [phone], or you can send a mychart message. We can be reached by phone or mychart 8am-4pm, Monday-Friday.  *Please note: Calls after 4pm are forwarded to a third party answering service. Mychart messages are not routinely monitored during evenings, weekends, and holidays. Please call our office to contact the answering service for urgent concerns during non-business hours.   If you have FMLA/disability paperwork, please drop it off or fax it to [phone], attention Patty.   Appointments/FMLA & disability paperwork: Patty & Cristin  Nurse: Royston Cowper  Medical assistants: Laurann Montana, & Lyla Son Physician Assistants: Manning Charity & Drake Leach Surgeons: Venetia Night, MD & Ernestine Mcmurray, MD

## 2023-04-10 ENCOUNTER — Ambulatory Visit
Admission: RE | Admit: 2023-04-10 | Discharge: 2023-04-10 | Disposition: A | Discharge status: Home / Self Care | Payer: BC Managed Care – PPO | Source: Ambulatory Visit | Attending: Neurosurgery | Admitting: Neurosurgery

## 2023-04-10 DIAGNOSIS — R29898 Other symptoms and signs involving the musculoskeletal system: Secondary | ICD-10-CM | POA: Diagnosis present

## 2023-04-10 DIAGNOSIS — M5010 Cervical disc disorder with radiculopathy, unspecified cervical region: Secondary | ICD-10-CM | POA: Insufficient documentation

## 2023-04-12 ENCOUNTER — Telehealth: Payer: Self-pay

## 2023-04-12 ENCOUNTER — Other Ambulatory Visit: Payer: Self-pay

## 2023-04-12 ENCOUNTER — Encounter
Admission: RE | Admit: 2023-04-12 | Discharge: 2023-04-12 | Disposition: A | Discharge status: Home / Self Care | Payer: BC Managed Care – PPO | Source: Ambulatory Visit | Attending: Neurosurgery | Admitting: Neurosurgery

## 2023-04-12 VITALS — BP 150/91 | HR 98 | Temp 98.0°F | Resp 18 | Ht 61.0 in | Wt 223.4 lb

## 2023-04-12 DIAGNOSIS — E119 Type 2 diabetes mellitus without complications: Secondary | ICD-10-CM | POA: Diagnosis not present

## 2023-04-12 DIAGNOSIS — Z01818 Encounter for other preprocedural examination: Secondary | ICD-10-CM | POA: Insufficient documentation

## 2023-04-12 DIAGNOSIS — Z01812 Encounter for preprocedural laboratory examination: Secondary | ICD-10-CM

## 2023-04-12 HISTORY — DX: Peroneal tendinitis, left leg: M76.72

## 2023-04-12 HISTORY — DX: Allergic urticaria: L50.0

## 2023-04-12 HISTORY — DX: Radiculopathy, cervical region: M54.12

## 2023-04-12 HISTORY — DX: Trigger finger, unspecified finger: M65.30

## 2023-04-12 HISTORY — DX: Chronic sinusitis, unspecified: J32.9

## 2023-04-12 LAB — TYPE AND SCREEN
ABO/RH(D): B POS
Antibody Screen: NEGATIVE

## 2023-04-12 LAB — SURGICAL PCR SCREEN
MRSA, PCR: NEGATIVE
Staphylococcus aureus: NEGATIVE

## 2023-04-12 LAB — CBC
HCT: 41.6 % (ref 36.0–46.0)
Hemoglobin: 13.8 g/dL (ref 12.0–15.0)
MCH: 28.6 pg (ref 26.0–34.0)
MCHC: 33.2 g/dL (ref 30.0–36.0)
MCV: 86.1 fL (ref 80.0–100.0)
Platelets: 193 10*3/uL (ref 150–400)
RBC: 4.83 MIL/uL (ref 3.87–5.11)
RDW: 12.5 % (ref 11.5–15.5)
WBC: 7.2 10*3/uL (ref 4.0–10.5)
nRBC: 0 % (ref 0.0–0.2)

## 2023-04-12 LAB — BASIC METABOLIC PANEL
Anion gap: 8 (ref 5–15)
BUN: 16 mg/dL (ref 6–20)
CO2: 24 mmol/L (ref 22–32)
Calcium: 8.7 mg/dL — ABNORMAL LOW (ref 8.9–10.3)
Chloride: 106 mmol/L (ref 98–111)
Creatinine, Ser: 0.57 mg/dL (ref 0.44–1.00)
GFR, Estimated: >60 mL/min (ref >60)
Glucose, Bld: 187 mg/dL — ABNORMAL HIGH (ref 70–99)
Potassium: 4.2 mmol/L (ref 3.5–5.1)
Sodium: 138 mmol/L (ref 135–145)

## 2023-04-12 NOTE — Telephone Encounter (Signed)
Note was given to the patient

## 2023-04-12 NOTE — Pre-Procedure Instructions (Signed)
Patient showed this nurse  patch on the base of her neck. She identified it as a "Lifewave" patch. As a supplemental medication it was advised by this nurse to not use the patch for 7 days prior to he surgery. Last dose will be Monday, August 26. Patient verbalized understanding and stated she would stop using the patch on that day.

## 2023-04-12 NOTE — Patient Instructions (Addendum)
Your procedure is scheduled on: Tuesday September 3  Report to the Registration Desk on the 1st floor of the [facility] To find out your arrival time, please call [phone] between 1PM - 3PM on: Friday August 30  If your arrival time is 6:00 am, do not arrive before that time as the Medical Mall entrance doors do not open until 6:00 am.  REMEMBER: Instructions that are not followed completely may result in serious medical risk, up to and including death; or upon the discretion of your surgeon and anesthesiologist your surgery may need to be rescheduled.  Do not eat food after midnight the night before surgery.  No gum chewing or hard candies.  You may however, drink WATER up to 2 hours before you are scheduled to arrive for your surgery. Do not drink anything within 2 hours of your scheduled arrival time.  One week prior to surgery: Stop Anti-inflammatories (NSAIDS) such as Advil, Aleve, Ibuprofen, Motrin, Naproxen, Naprosyn and Aspirin based products such as Excedrin, Goody's Powder, BC Powder. Stop ANY OVER THE COUNTER supplements until after surgery. You may however, continue to take Tylenol if needed for pain up until the day of surgery.  Continue taking all prescribed medications with the exception of the following: meloxicam (MOBIC) stop 7 days prior to surgery, last dose Monday August 26  OZEMPIC hold 7 days prior to surgery, last dose Monday August 26      Follow recommendations from Cardiologist or PCP regarding stopping blood thinners.  TAKE ONLY THESE MEDICATIONS THE MORNING OF SURGERY WITH A SIP OF WATER:  atorvastatin (LIPITOR)  DULoxetine (CYMBALTA)  ipratropium (ATROVENT) Use inhalers on the day of surgery and bring to the hospital. omeprazole (PRILOSEC) (take one the night before and one on the morning of surgery - helps to prevent nausea after surgery.) tiZANidine (ZANAFLEX)   Fleets enema or bowel prep as directed.  No Alcohol for 24 hours before or  after surgery.  No Smoking including e-cigarettes for 24 hours before surgery.  No chewable tobacco products for at least 6 hours before surgery.  No nicotine patches on the day of surgery.  Do not use any "recreational" drugs for at least a week (preferably 2 weeks) before your surgery.  Please be advised that the combination of cocaine and anesthesia may have negative outcomes, up to and including death. If you test positive for cocaine, your surgery will be cancelled.  On the morning of surgery brush your teeth with toothpaste and water, you may rinse your mouth with mouthwash if you wish. Do not swallow any toothpaste or mouthwash.  Use CHG Soap as directed on instruction sheet.  Do not wear jewelry, make-up, hairpins, clips or nail polish.  Do not wear lotions, powders, or perfumes.   Do not shave body hair from the neck down 48 hours before surgery.  Contact lenses, hearing aids and dentures may not be worn into surgery.  Do not bring valuables to the hospital. [facility] is not responsible for any missing/lost belongings or valuables.   Notify your doctor if there is any change in your medical condition (cold, fever, infection).  Wear comfortable clothing (specific to your surgery type) to the hospital.  After surgery, you can help prevent lung complications by doing breathing exercises.  Take deep breaths and cough every 1-2 hours.   If you are being admitted to the hospital overnight, leave your suitcase in the car. After surgery it may be brought to your room.  In case  of increased patient census, it may be necessary for you, the patient, to continue your postoperative care in the Same Day Surgery department.  If you are being discharged the day of surgery, you will not be allowed to drive home. You will need a responsible individual to drive you home and stay with you for 24 hours after surgery.   If you are taking public transportation, you will need to have a  responsible individual with you.  Please call the Pre-admissions Testing Dept. at [phone] if you have any questions about these instructions.  Surgery Visitation Policy:  Patients having surgery or a procedure may have two visitors.  Children under the age of 28 must have an adult with them who is not the patient.  Inpatient Visitation:    Visiting hours are 7 a.m. to 8 p.m. Up to four visitors are allowed at one time in a patient room. The visitors may rotate out with other people during the day.  One visitor age 27 or older may stay with the patient overnight and must be in the room by 8 p.m.              Su procedimiento est programado para el: martes 3 de septiembre Environmental health practitioner de Chartered certified accountant piso del [facility] Para conocer su hora de llegada, llame al [phone] entre la 1:00 p. m. y las 3:00 p. m. el: viernes 30 de agosto Si su hora de llegada es a las 6:00 a. m., no llegue antes de esa hora, ya que las puertas de [place] del Medical Mall no abren hasta las 6:00 a. m.  RECUERDE: Las instrucciones que no se siguen Programmer, multimedia en un riesgo mdico grave, que puede incluir la [place]; o, a discrecin de su cirujano y Scientific laboratory technician, es posible que sea Aeronautical engineer su Leisure centre manager.  No ingiera alimentos despus de la medianoche anterior a la ciruga. No masque chicle ni caramelos duros.  Sin embargo, puede beber AGUA hasta 2 horas antes de la hora programada para su llegada para la [place]. No beba nada dentro de las 2 horas posteriores a la hora programada de llegada.  Una semana antes de la ciruga: Deje de tomar antiinflamatorios no esteroides (AINE), como Advil, Aleve, ibuprofeno, Motrin, naproxeno, Naprosyn y productos a base de aspirina, como Excedrin, Goody's Powder, [employer]. Deje de tomar [employer] suplemento de VENTA LIBRE hasta despus de la [place]. Sin embargo, puede seguir tomando Tylenol si lo  necesita para Marketing executive de la [place].  Contine tomando todos los medicamentos recetados, con la excepcin de los siguientes: meloxicam (MOBIC): deje de tomar 7 [address] antes de la [place], ltima dosis el lunes 26 de agosto  OZEMPIC: deje de tomar 7 das antes de la [place], ltima dosis el lunes 26 de agosto  Siga las recomendaciones del cardilogo o mdico de cabecera con respecto a dejar de tomar anticoagulantes.  TOME SOLO ESTOS MEDICAMENTOS LA MAANA DE LA CIRUGA CON UN SORBO DE AGUA:  1. atorvastatina (LIPITOR) 2. DULoxetina (CYMBALTA) 3. ipratropio (ATROVENT) Use inhaladores el da de la ciruga y llvelos al hospital. 4. omeprazol (PRILOSEC) (tome uno la noche anterior y otro la maana de la [place]; [place] a prevenir las nuseas despus de la ciruga). 5. tiZANidina (ZANAFLEX) 6. Enema Fleets o preparacin intestinal segn las indicaciones.  No consuma alcohol durante las 24 horas anteriores o posteriores a la [place].  No fume, incluidos los cigarrillos electrnicos, durante las  24 horas anteriores a la [place]. No consuma productos de tabaco masticables durante al menos 6 horas antes de la [place]. No use parches de nicotina el da de la [place].  No use ninguna droga "recreativa" durante al menos una semana (preferiblemente 2 semanas) antes de la ciruga. Tenga en cuenta que la combinacin de cocana y anestesia puede tener consecuencias negativas, incluso la [place].  Si el resultado de la prueba de cocana es positivo, se cancelar la ciruga.  En la maana de la [place], cepllese los dientes con pasta de dientes y [place]; puede enjuagarse la boca con enjuague bucal si lo desea. No trague ninguna pasta de dientes ni enjuague bucal.  Use jabn CHG segn las instrucciones de la hoja de instrucciones.  No use joyas, maquillaje, horquillas, broches ni esmalte de uas.  No use lociones, polvos ni perfumes.  No se afeite el vello corporal desde el cuello hacia  abajo 48 horas antes de la [place].  No se pueden usar lentes de contacto, audfonos ni dentaduras postizas durante la [place].  No traiga objetos de valor al hospital. [facility] no se hace responsable de ninguna pertenencia o artculo de valor perdido o faltante.  Notifique a su mdico si hay algn cambio en su condicin mdica (resfriado, fiebre, infeccin).  Use ropa cmoda (especfica para el tipo de [place]) para ir al hospital.  Despus de la ciruga, puede ayudar a prevenir complicaciones pulmonares haciendo ejercicios de respiracin. Respire profundamente y tosa cada 1 o 2 horas.  Si lo Zenaida Niece a [employer] noche, deje su maleta en el automvil. Despus de la [place], es posible que la lleven a su habitacin.  En caso de que aumente el nmero de [place], es posible que sea necesario que usted, el paciente, contine con su atencin posoperatoria en el departamento de [place].  Si le dan el alta el da de la [place], no se le permitir conducir hasta su casa. Necesitar una persona responsable que lo lleve a su casa y se quede con usted durante 24 horas despus de la [place].  Si va a tomar transporte pblico, necesitar tener una persona responsable con usted.  Llame al Departamento de [place] a la [place] al [phone] si tiene alguna pregunta sobre estas instrucciones.  Poltica de visitas a cirugas:  Los [employer] se sometan a [place] o un procedimiento pueden [employer] visitantes. Los nios menores de 16 aos deben estar acompaados por un adulto que no sea el [place].  Visitas a pacientes internados:  El horario de visitas es de 7 a. m. a 8 p. m. Se permiten hasta cuatro visitantes a la vez en la habitacin de Medical laboratory scientific officer. Los visitantes pueden rotar con Garment/textile technologist. Un visitante de 16 aos o ms puede quedarse con el paciente durante la noche y Scientist, research (physical sciences) en la habitacin a las 8 p.  m.               Preparacin para la ciruga con jabn de GLUCONATO DE CLORHEXIDINA (CHG)  Jabn de gluconato de clorhexidina (CHG)  * Un limpiador antisptico que [employer] grmenes y se adhiere a la piel para seguir [employer] grmenes incluso despus del lavado.   *Se utiliza para ducharse la noche anterior a la [place] y la maana de la [place].  Antes de la [place], usted puede desempear un papel importante al reducir la cantidad de grmenes en su piel. El jabn CHG (gluconato  de clorhexidina) es un limpiador antisptico que [employer] grmenes y se adhiere a la piel para continuar matndolos incluso despus del lavado.  No lo utilice si es alrgico al CHG o a los jabones antibacterianos. Si su piel se enrojece o irrita, deje de usar CHG.  1. Ducharse la [place] ANTES DE LA CIRUGA y la [place] DE LA CIRUGA con jabn CHG.  2. Si eliges lavarte el cabello, lvalo primero como de costumbre con tu champ habitual.  3. Despus del champ, enjuague bien el cabello y el cuerpo para eliminar el champ.  4. Utilice CHG como lo hara con cualquier otro jabn lquido. Puede aplicar CHG directamente sobre la piel y lavar suavemente con un pauelo o una toallita limpia.  5. Aplique el jabn CHG en su cuerpo nicamente desde el cuello hacia abajo. No utilizar en heridas abiertas o llagas abiertas. Evite el contacto con los ojos, odos, boca y genitales (partes privadas). Lvese la cara y los genitales (partes privadas) con su jabn habitual.  6. Lvese bien, prestando especial atencin al rea donde se realizar su ciruga.  7. Enjuague bien su cuerpo con agua tibia.  8. No se duche ni se lave con su jabn normal despus de usar y enjuagar el jabn CHG.  9. Squese dando palmaditas con una toalla limpia.  10. Use pijamas limpios para dormir la noche anterior a la ciruga.  12. Coloque sbanas limpias en su cama la noche de su primera ducha y no duerma con mascotas.  13.  Ducharse nuevamente con el jabn CHG el da de la ciruga antes de llegar al hospital.  14. No aplique desodorantes, lociones o polvos.  15. Por favor use ropa limpia al hospital.

## 2023-04-12 NOTE — Telephone Encounter (Signed)
Tonya Mueller stopped by this morning asking that I speak with her son, Tonya Mueller.  Tonya Mueller called in asking about post-operative expectations. He is going to fly in from [place] to help his mother. Dr Katrinka Blazing suggested he stay for 4 days. We discussed some of the most common post-op restrictions and concerns. I encouraged him to call back if he or Tonya Tingen think of any additional questions prior to surgery.

## 2023-04-25 ENCOUNTER — Observation Stay
Admission: RE | Admit: 2023-04-25 | Discharge: 2023-04-26 | Disposition: A | Discharge status: Home / Self Care | Payer: BC Managed Care – PPO | Attending: Neurosurgery | Admitting: Neurosurgery

## 2023-04-25 ENCOUNTER — Encounter: Payer: Self-pay | Admitting: Neurosurgery

## 2023-04-25 ENCOUNTER — Other Ambulatory Visit: Payer: Self-pay

## 2023-04-25 ENCOUNTER — Encounter
Admission: RE | Disposition: A | Discharge status: Home / Self Care | Payer: Self-pay | Source: Home / Self Care | Attending: Neurosurgery

## 2023-04-25 ENCOUNTER — Ambulatory Visit: Payer: BC Managed Care – PPO

## 2023-04-25 ENCOUNTER — Ambulatory Visit: Payer: BC Managed Care – PPO | Admitting: Urgent Care

## 2023-04-25 ENCOUNTER — Ambulatory Visit: Payer: BC Managed Care – PPO | Admitting: Anesthesiology

## 2023-04-25 DIAGNOSIS — R29898 Other symptoms and signs involving the musculoskeletal system: Secondary | ICD-10-CM | POA: Diagnosis not present

## 2023-04-25 DIAGNOSIS — M50123 Cervical disc disorder at C6-C7 level with radiculopathy: Secondary | ICD-10-CM | POA: Diagnosis present

## 2023-04-25 DIAGNOSIS — M5010 Cervical disc disorder with radiculopathy, unspecified cervical region: Secondary | ICD-10-CM | POA: Diagnosis not present

## 2023-04-25 DIAGNOSIS — Z79899 Other long term (current) drug therapy: Secondary | ICD-10-CM | POA: Insufficient documentation

## 2023-04-25 DIAGNOSIS — Z01812 Encounter for preprocedural laboratory examination: Secondary | ICD-10-CM

## 2023-04-25 DIAGNOSIS — Z7984 Long term (current) use of oral hypoglycemic drugs: Secondary | ICD-10-CM | POA: Insufficient documentation

## 2023-04-25 DIAGNOSIS — E119 Type 2 diabetes mellitus without complications: Secondary | ICD-10-CM | POA: Insufficient documentation

## 2023-04-25 DIAGNOSIS — Z01818 Encounter for other preprocedural examination: Secondary | ICD-10-CM

## 2023-04-25 DIAGNOSIS — Z981 Arthrodesis status: Secondary | ICD-10-CM

## 2023-04-25 HISTORY — PX: ANTERIOR CERVICAL DECOMP/DISCECTOMY FUSION: SHX1161

## 2023-04-25 LAB — GLUCOSE, CAPILLARY
Glucose-Capillary: 114 mg/dL — ABNORMAL HIGH (ref 70–99)
Glucose-Capillary: 153 mg/dL — ABNORMAL HIGH (ref 70–99)

## 2023-04-25 LAB — ABO/RH: ABO/RH(D): B POS

## 2023-04-25 SURGERY — ANTERIOR CERVICAL DECOMPRESSION/DISCECTOMY FUSION 1 LEVEL
Anesthesia: General

## 2023-04-25 MED ORDER — KETOROLAC TROMETHAMINE 15 MG/ML IJ SOLN
INTRAMUSCULAR | Status: AC
  Filled 2023-04-25: qty 1

## 2023-04-25 MED ORDER — SUCCINYLCHOLINE CHLORIDE 200 MG/10ML IV SOSY
PREFILLED_SYRINGE | INTRAVENOUS | Status: AC
  Filled 2023-04-25: qty 10

## 2023-04-25 MED ORDER — MENTHOL 3 MG MT LOZG: 1.0000 | LOZENGE | OROMUCOSAL | Status: DC | PRN

## 2023-04-25 MED ORDER — ORAL CARE MOUTH RINSE: 15.0000 mL | Freq: Once | OROMUCOSAL | Status: AC

## 2023-04-25 MED ORDER — DEXAMETHASONE SODIUM PHOSPHATE 10 MG/ML IJ SOLN
INTRAMUSCULAR | Status: AC
  Filled 2023-04-25: qty 1

## 2023-04-25 MED ORDER — SODIUM CHLORIDE 0.9% FLUSH
3.0000 mL | Freq: Two times a day (BID) | INTRAVENOUS | Status: DC
  Administered 2023-04-25: 3 mL via INTRAVENOUS

## 2023-04-25 MED ORDER — ATORVASTATIN CALCIUM 20 MG PO TABS
20.0000 mg | ORAL_TABLET | Freq: Every day | ORAL | Status: DC
  Administered 2023-04-25 – 2023-04-26 (×2): 20 mg via ORAL

## 2023-04-25 MED ORDER — PANTOPRAZOLE SODIUM 40 MG PO TBEC
DELAYED_RELEASE_TABLET | ORAL | Status: AC
  Filled 2023-04-25: qty 1

## 2023-04-25 MED ORDER — GABAPENTIN 100 MG PO CAPS
ORAL_CAPSULE | ORAL | Status: AC
  Filled 2023-04-25: qty 1

## 2023-04-25 MED ORDER — FENTANYL CITRATE (PF) 100 MCG/2ML IJ SOLN
INTRAMUSCULAR | Status: AC
  Filled 2023-04-25: qty 2

## 2023-04-25 MED ORDER — LIDOCAINE HCL (PF) 2 % IJ SOLN
INTRAMUSCULAR | Status: AC
  Filled 2023-04-25: qty 5

## 2023-04-25 MED ORDER — SUCCINYLCHOLINE CHLORIDE 200 MG/10ML IV SOSY
PREFILLED_SYRINGE | INTRAVENOUS | Status: DC | PRN
  Administered 2023-04-25: 120 mg via INTRAVENOUS

## 2023-04-25 MED ORDER — DULOXETINE HCL 60 MG PO CPEP
60.0000 mg | ORAL_CAPSULE | Freq: Every day | ORAL | Status: DC
  Administered 2023-04-25 – 2023-04-26 (×2): 60 mg via ORAL
  Filled 2023-04-25 (×2): qty 1

## 2023-04-25 MED ORDER — SURGIFLO WITH THROMBIN (HEMOSTATIC MATRIX KIT) OPTIME
TOPICAL | Status: DC | PRN
  Administered 2023-04-25: 1 via TOPICAL

## 2023-04-25 MED ORDER — BUPIVACAINE-EPINEPHRINE (PF) 0.5% -1:200000 IJ SOLN
INTRAMUSCULAR | Status: DC | PRN
  Administered 2023-04-25: 5 mL

## 2023-04-25 MED ORDER — MIDAZOLAM HCL 2 MG/2ML IJ SOLN
INTRAMUSCULAR | Status: AC
  Filled 2023-04-25: qty 2

## 2023-04-25 MED ORDER — SODIUM CHLORIDE 0.9% FLUSH: 3.0000 mL | INTRAVENOUS | Status: DC | PRN

## 2023-04-25 MED ORDER — 0.9 % SODIUM CHLORIDE (POUR BTL) OPTIME
TOPICAL | Status: DC | PRN
  Administered 2023-04-25: 500 mL

## 2023-04-25 MED ORDER — ACETAMINOPHEN 500 MG PO TABS
1000.0000 mg | ORAL_TABLET | Freq: Four times a day (QID) | ORAL | Status: AC
  Administered 2023-04-25 – 2023-04-26 (×4): 1000 mg via ORAL

## 2023-04-25 MED ORDER — CEFAZOLIN SODIUM-DEXTROSE 2-4 GM/100ML-% IV SOLN
2.0000 g | Freq: Once | INTRAVENOUS | Status: AC
  Administered 2023-04-25: 2 g via INTRAVENOUS

## 2023-04-25 MED ORDER — ACETAMINOPHEN 500 MG PO TABS
ORAL_TABLET | ORAL | Status: AC
  Filled 2023-04-25: qty 2

## 2023-04-25 MED ORDER — CETIRIZINE HCL 10 MG PO TABS
5.0000 mg | ORAL_TABLET | Freq: Every evening | ORAL | Status: DC
  Administered 2023-04-25: 5 mg via ORAL

## 2023-04-25 MED ORDER — BUPIVACAINE-EPINEPHRINE (PF) 0.5% -1:200000 IJ SOLN
INTRAMUSCULAR | Status: AC
  Filled 2023-04-25: qty 10

## 2023-04-25 MED ORDER — SENNA 8.6 MG PO TABS
1.0000 | ORAL_TABLET | Freq: Two times a day (BID) | ORAL | Status: DC
  Administered 2023-04-25 – 2023-04-26 (×3): 8.6 mg via ORAL

## 2023-04-25 MED ORDER — OXYCODONE HCL 5 MG PO TABS
ORAL_TABLET | ORAL | Status: AC
  Filled 2023-04-25: qty 1

## 2023-04-25 MED ORDER — DOCUSATE SODIUM 100 MG PO CAPS
100.0000 mg | ORAL_CAPSULE | Freq: Two times a day (BID) | ORAL | Status: DC
  Administered 2023-04-25 – 2023-04-26 (×3): 100 mg via ORAL

## 2023-04-25 MED ORDER — EPHEDRINE SULFATE (PRESSORS) 50 MG/ML IJ SOLN
INTRAMUSCULAR | Status: DC | PRN
  Administered 2023-04-25: 10 mg via INTRAVENOUS

## 2023-04-25 MED ORDER — ONDANSETRON HCL 4 MG/2ML IJ SOLN
INTRAMUSCULAR | Status: DC | PRN
  Administered 2023-04-25: 4 mg via INTRAVENOUS

## 2023-04-25 MED ORDER — POLYETHYLENE GLYCOL 3350 17 G PO PACK: 17.0000 g | PACK | Freq: Every day | ORAL | Status: DC | PRN

## 2023-04-25 MED ORDER — FENTANYL CITRATE (PF) 100 MCG/2ML IJ SOLN
INTRAMUSCULAR | Status: DC | PRN
  Administered 2023-04-25: 50 ug via INTRAVENOUS

## 2023-04-25 MED ORDER — ACETAMINOPHEN 325 MG PO TABS: 650.0000 mg | ORAL_TABLET | ORAL | Status: DC | PRN

## 2023-04-25 MED ORDER — OXYCODONE HCL 5 MG/5ML PO SOLN: 5.0000 mg | Freq: Once | ORAL | Status: DC | PRN

## 2023-04-25 MED ORDER — FENTANYL CITRATE (PF) 100 MCG/2ML IJ SOLN
25.0000 ug | INTRAMUSCULAR | Status: DC | PRN
  Administered 2023-04-25 (×2): 25 ug via INTRAVENOUS

## 2023-04-25 MED ORDER — PHENYLEPHRINE HCL (PRESSORS) 10 MG/ML IV SOLN
INTRAVENOUS | Status: DC | PRN
  Administered 2023-04-25 (×2): 160 ug via INTRAVENOUS
  Administered 2023-04-25: 80 ug via INTRAVENOUS
  Administered 2023-04-25: 160 ug via INTRAVENOUS
  Administered 2023-04-25: 240 ug via INTRAVENOUS
  Administered 2023-04-25: 160 ug via INTRAVENOUS

## 2023-04-25 MED ORDER — TIZANIDINE HCL 4 MG PO TABS
4.0000 mg | ORAL_TABLET | Freq: Three times a day (TID) | ORAL | Status: DC
  Administered 2023-04-25 – 2023-04-26 (×4): 4 mg via ORAL
  Filled 2023-04-25 (×5): qty 1

## 2023-04-25 MED ORDER — PROPOFOL 10 MG/ML IV BOLUS
INTRAVENOUS | Status: AC
  Filled 2023-04-25: qty 20

## 2023-04-25 MED ORDER — CHLORHEXIDINE GLUCONATE 0.12 % MT SOLN
15.0000 mL | Freq: Once | OROMUCOSAL | Status: AC
  Administered 2023-04-25: 15 mL via OROMUCOSAL

## 2023-04-25 MED ORDER — ACETAMINOPHEN 650 MG RE SUPP: 650.0000 mg | RECTAL | Status: DC | PRN

## 2023-04-25 MED ORDER — SENNOSIDES-DOCUSATE SODIUM 8.6-50 MG PO TABS
ORAL_TABLET | ORAL | Status: AC
  Filled 2023-04-25: qty 1

## 2023-04-25 MED ORDER — SODIUM CHLORIDE 0.9 % IV SOLN: 250.0000 mL | INTRAVENOUS | Status: DC

## 2023-04-25 MED ORDER — ATORVASTATIN CALCIUM 20 MG PO TABS
ORAL_TABLET | ORAL | Status: AC
  Filled 2023-04-25: qty 1

## 2023-04-25 MED ORDER — CEFAZOLIN SODIUM-DEXTROSE 2-4 GM/100ML-% IV SOLN
INTRAVENOUS | Status: AC
  Filled 2023-04-25: qty 100

## 2023-04-25 MED ORDER — ONDANSETRON HCL 4 MG PO TABS: 4.0000 mg | ORAL_TABLET | Freq: Four times a day (QID) | ORAL | Status: DC | PRN

## 2023-04-25 MED ORDER — MIDAZOLAM HCL 2 MG/2ML IJ SOLN
INTRAMUSCULAR | Status: DC | PRN
  Administered 2023-04-25: 2 mg via INTRAVENOUS

## 2023-04-25 MED ORDER — SODIUM CHLORIDE 0.9 % IV SOLN: INTRAVENOUS | Status: DC

## 2023-04-25 MED ORDER — ONDANSETRON HCL 4 MG/2ML IJ SOLN
INTRAMUSCULAR | Status: AC
  Filled 2023-04-25: qty 2

## 2023-04-25 MED ORDER — REMIFENTANIL HCL 1 MG IV SOLR
INTRAVENOUS | Status: DC | PRN
Start: 2023-04-25 — End: 2023-04-25
  Administered 2023-04-25: .06 ug/kg/min via INTRAVENOUS

## 2023-04-25 MED ORDER — SENNA 8.6 MG PO TABS
ORAL_TABLET | ORAL | Status: AC
  Filled 2023-04-25: qty 1

## 2023-04-25 MED ORDER — OXYCODONE HCL 5 MG PO TABS
5.0000 mg | ORAL_TABLET | ORAL | Status: DC | PRN
  Administered 2023-04-25: 5 mg via ORAL

## 2023-04-25 MED ORDER — OXYCODONE HCL 5 MG PO TABS: 5.0000 mg | ORAL_TABLET | Freq: Once | ORAL | Status: DC | PRN

## 2023-04-25 MED ORDER — PANTOPRAZOLE SODIUM 40 MG PO TBEC
40.0000 mg | DELAYED_RELEASE_TABLET | Freq: Every day | ORAL | Status: DC
  Administered 2023-04-25 – 2023-04-26 (×2): 40 mg via ORAL

## 2023-04-25 MED ORDER — ENOXAPARIN SODIUM 40 MG/0.4ML IJ SOSY
40.0000 mg | PREFILLED_SYRINGE | INTRAMUSCULAR | Status: DC
  Administered 2023-04-26: 40 mg via SUBCUTANEOUS

## 2023-04-25 MED ORDER — CETIRIZINE HCL 10 MG PO TABS
ORAL_TABLET | ORAL | Status: AC
  Filled 2023-04-25: qty 1

## 2023-04-25 MED ORDER — DEXAMETHASONE SODIUM PHOSPHATE 10 MG/ML IJ SOLN
INTRAMUSCULAR | Status: DC | PRN
  Administered 2023-04-25: 10 mg via INTRAVENOUS

## 2023-04-25 MED ORDER — DOCUSATE SODIUM 100 MG PO CAPS
ORAL_CAPSULE | ORAL | Status: AC
  Filled 2023-04-25: qty 1

## 2023-04-25 MED ORDER — REMIFENTANIL HCL 1 MG IV SOLR
INTRAVENOUS | Status: AC
  Filled 2023-04-25: qty 1000

## 2023-04-25 MED ORDER — PROPOFOL 10 MG/ML IV BOLUS
INTRAVENOUS | Status: DC | PRN
  Administered 2023-04-25: 150 mg via INTRAVENOUS

## 2023-04-25 MED ORDER — OXYCODONE HCL 5 MG PO TABS: 10.0000 mg | ORAL_TABLET | ORAL | Status: DC | PRN

## 2023-04-25 MED ORDER — GABAPENTIN 100 MG PO CAPS
100.0000 mg | ORAL_CAPSULE | Freq: Every day | ORAL | Status: DC
  Administered 2023-04-25: 100 mg via ORAL

## 2023-04-25 MED ORDER — ONDANSETRON HCL 4 MG/2ML IJ SOLN: 4.0000 mg | Freq: Four times a day (QID) | INTRAMUSCULAR | Status: DC | PRN

## 2023-04-25 MED ORDER — MAGNESIUM CITRATE PO SOLN: 1.0000 | Freq: Once | ORAL | Status: DC | PRN

## 2023-04-25 MED ORDER — BISACODYL 10 MG RE SUPP: 10.0000 mg | Freq: Every day | RECTAL | Status: DC | PRN

## 2023-04-25 MED ORDER — KETOROLAC TROMETHAMINE 15 MG/ML IJ SOLN
15.0000 mg | Freq: Four times a day (QID) | INTRAMUSCULAR | Status: AC
  Administered 2023-04-25 – 2023-04-26 (×4): 15 mg via INTRAVENOUS

## 2023-04-25 MED ORDER — PHENOL 1.4 % MT LIQD: 1.0000 | OROMUCOSAL | Status: DC | PRN

## 2023-04-25 MED ORDER — CHLORHEXIDINE GLUCONATE 0.12 % MT SOLN
OROMUCOSAL | Status: AC
  Filled 2023-04-25: qty 15

## 2023-04-25 MED ORDER — LIDOCAINE HCL (CARDIAC) PF 100 MG/5ML IV SOSY
PREFILLED_SYRINGE | INTRAVENOUS | Status: DC | PRN
  Administered 2023-04-25: 100 mg via INTRAVENOUS

## 2023-04-25 MED ORDER — PHENYLEPHRINE HCL-NACL 20-0.9 MG/250ML-% IV SOLN
INTRAVENOUS | Status: DC | PRN
Start: 2023-04-25 — End: 2023-04-25
  Administered 2023-04-25: 50 ug/min via INTRAVENOUS

## 2023-04-25 SURGICAL SUPPLY — 48 items
ADH SKN CLS APL DERMABOND .7 (GAUZE/BANDAGES/DRESSINGS) ×1
AGENT HMST KT MTR STRL THRMB (HEMOSTASIS) ×1
BASIN KIT SINGLE STR (MISCELLANEOUS) ×1 IMPLANT
BIT DRILL ACP 13 (DRILL) IMPLANT
BRUSH SCRUB EZ 4% CHG (MISCELLANEOUS) ×1 IMPLANT
BULB RESERV EVAC DRAIN JP 100C (MISCELLANEOUS) IMPLANT
BUR NEURO DRILL SOFT 3.0X3.8M (BURR) ×1 IMPLANT
DERMABOND ADVANCED .7 DNX12 (GAUZE/BANDAGES/DRESSINGS) ×1 IMPLANT
DRAIN CHANNEL JP 10F RND 20C F (MISCELLANEOUS) IMPLANT
DRAPE C-ARM XRAY 36X54 (DRAPES) ×2 IMPLANT
DRAPE LAPAROTOMY 77X122 PED (DRAPES) ×1 IMPLANT
DRAPE MICROSCOPE SPINE 48X150 (DRAPES) ×1 IMPLANT
DRILL ACP 13 (DRILL) ×1
DRSG TEGADERM 2-3/8X2-3/4 SM (GAUZE/BANDAGES/DRESSINGS) ×1 IMPLANT
DRSG TELFA 3X4 N-ADH STERILE (GAUZE/BANDAGES/DRESSINGS) ×1 IMPLANT
ELECT REM PT RETURN 9FT ADLT (ELECTROSURGICAL) ×1
ELECTRODE REM PT RTRN 9FT ADLT (ELECTROSURGICAL) ×1 IMPLANT
FEE INTRAOP CADWELL SUPPLY NCS (MISCELLANEOUS) IMPLANT
FEE INTRAOP MONITOR IMPULS NCS (MISCELLANEOUS) IMPLANT
GLOVE SRG 8 PF TXTR STRL LF DI (GLOVE) ×1 IMPLANT
GLOVE SURG SYN 7.5 E (GLOVE) ×1 IMPLANT
GLOVE SURG SYN 7.5 PF PI (GLOVE) ×1 IMPLANT
GLOVE SURG UNDER POLY LF SZ8 (GLOVE) ×1
GOWN SRG LRG LVL 4 IMPRV REINF (GOWNS) ×1 IMPLANT
GOWN STRL REIN LRG LVL4 (GOWNS) ×1
GOWN STRL REUS W/ TWL XL LVL3 (GOWN DISPOSABLE) ×1 IMPLANT
GOWN STRL REUS W/TWL XL LVL3 (GOWN DISPOSABLE) ×1
INTRAOP CADWELL SUPPLY FEE NCS (MISCELLANEOUS)
INTRAOP DISP SUPPLY FEE NCS (MISCELLANEOUS)
INTRAOP MONITOR FEE IMPULS NCS (MISCELLANEOUS)
KIT TURNOVER KIT A (KITS) ×1 IMPLANT
MANIFOLD NEPTUNE II (INSTRUMENTS) ×1 IMPLANT
NS IRRIG 1000ML POUR BTL (IV SOLUTION) ×1 IMPLANT
PACK LAMINECTOMY ARMC (PACKS) ×1 IMPLANT
PAD ARMBOARD 7.5X6 YLW CONV (MISCELLANEOUS) ×2 IMPLANT
PIN ACP TEMP FIXATION (EXFIX) IMPLANT
PIN CASPAR 14 (PIN) ×1 IMPLANT
PIN CASPAR 14MM (PIN) ×2
PLATE ACP 1.6 18 (Plate) IMPLANT
SCREW ACP VA ST 3.5X15 (Screw) IMPLANT
SPACER CERVICAL FRGE 12X14X7-7 (Spacer) IMPLANT
SPONGE KITTNER 5P (MISCELLANEOUS) ×2 IMPLANT
SURGIFLO W/THROMBIN 8M KIT (HEMOSTASIS) ×1 IMPLANT
SUT V-LOC 90 ABS DVC 3-0 CL (SUTURE) IMPLANT
SUT VIC AB 3-0 SH 8-18 (SUTURE) ×1 IMPLANT
SYR 20ML LL LF (SYRINGE) ×1 IMPLANT
TAPE CLOTH 3X10 WHT NS LF (GAUZE/BANDAGES/DRESSINGS) ×3 IMPLANT
TRAP FLUID SMOKE EVACUATOR (MISCELLANEOUS) ×1 IMPLANT

## 2023-04-25 NOTE — Op Note (Signed)
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   Preoperative Note: 57 year old woman with a history of previous cervical radiculopathy which was treated conservatively, she was lifting something overhead and then felt an immediate exacerbation of electric shocklike symptoms in the left medial scapula and left arm.  She has had weakness in her arm ever since that time and a loss sense of sensation to her fingers.  In for medical exam she had a positive Spurling sign, weak elbow extension to 4 -, biceps were approximately 4+.  Severe pain with activating her deltoid.  MRI was performed which demonstrated a paracentral disc herniation at C6-7 causing compression of the left C7 nerve root and lateral spinal cord.  Given her weakness and acute radiculopathy without improvement we planned for a C6-7 anterior cervical discectomy and fusion.  Risks of surgery discussed include: infection, bleeding, stroke, coma, death, paralysis, CSF leak, nerve/spinal cord injury, numbness, tingling, weakness, complex regional pain syndrome, recurrent stenosis and/or disc herniation, vascular injury, development of instability, neck/back pain, need for further surgery, persistent symptoms, development of deformity, and the risks of anesthesia. The patient understood these risks and agreed to proceed.  Procedure:  1) Anterior cervical diskectomy and fusion at C6-7 2) Anterior cervical instrumentation at  C6-7 3) Structural allograft consisting of corticocancellous allograft   Procedure: After obtaining informed consent, the patient taken to the operating room, placed in supine position, general anesthesia induced.  The patient had a small shoulder roll placed behind their shoulders.  The patient received preop antibiotics and IV Decadron.  The patient had a neck incision outlined, was prepped and draped in usual sterile fashion. The incision was injected with local anesthetic.   An incision was opened, dissection taken down medial to the carotid artery and jugular vein, lateral to the trachea and esophagus.  The prevertebral fascia identified and a localizing x-ray demonstrated the correct level.  The longus colli were dissected laterally, and self-retaining retractors placed to open the operative field. The microscope was then brought into the field.  With this complete, distractor pins were placed in the vertebral bodies of C6 and C7. The distractor was placed, and the annulus at C6/7 was opened using a bovie.  Curettes and pituitary rongeurs used to remove the majority of disk, then the drill was used to remove the posterior osteophyte and begin the foraminotomies. The nerve hook was used to elevate the posterior longitudinal ligament, which was then removed with Kerrison rongeurs. The microblunt nerve hook could be passed out the foramen bilaterally.   Meticulous hemostasis obtained.  Structural allograft was tapped behind the anterior lip of the vertebral body at C6/7 (7 mm).    The caspar distractor was removed, and bone wax used for hemostasis. A separate, 18 mm  plate was chosen.  Two screws placed in each vertebral body, respectively making sure the screws were behind the locking mechanism.  Final AP and lateral radiographs were taken.   With everything in good position, the wound was irrigated  copiously and meticulous hemostasis obtained.  Wound was closed in 2 layers using interrupted inverted  3-0 Vicryl sutures.  The wound was dressed with dermabond, the head of bed at 30 degrees, taken to recovery room in stable condition.  No new postop neurological deficits were identified.  Sponge and pattie counts were correct at the end of the procedure.    I performed the procedure with the assistance of Manning Charity PA-C, they aided in the exposure, retraction of critical structures, visualization of critical structures, stabilization during instrumentation, and closure.  Lovenia Kim, MD/MSCR Implants: Implant Name Type Inv. Item Serial No. Manufacturer Lot No. LRB No. Used Action  SPACER CERVICAL FRGE [id number] - [id number] Spacer SPACER CERVICAL FRGE [id number] [employer]  N/A 1 Implanted  PLATE ACP 1.6 18 - [id number] Plate PLATE ACP 1.6 18  [employer]  N/A 1 Implanted  SCREW ACP VA ST 3.5X15 - [id number] Screw SCREW ACP VA ST 3.5X15  [employer]  N/A 4 Implanted

## 2023-04-25 NOTE — Anesthesia Preprocedure Evaluation (Signed)
Anesthesia Evaluation  Patient identified by MRN, date of birth, ID band Patient awake    Reviewed: Allergy & Precautions, NPO status , Patient's Chart, lab work & pertinent test results  History of Anesthesia Complications Negative for: history of anesthetic complications  Airway Mallampati: III  TM Distance: <3 FB Neck ROM: full    Dental  (+) Chipped, Poor Dentition, Lower Dentures   Pulmonary neg pulmonary ROS, neg shortness of breath   Pulmonary exam normal        Cardiovascular Exercise Tolerance: Good (-) angina (-) Past MI and (-) DOE negative cardio ROS Normal cardiovascular exam     Neuro/Psych  Neuromuscular disease  negative psych ROS   GI/Hepatic Neg liver ROS,GERD  Controlled,,  Endo/Other  diabetes, Type 2    Renal/GU      Musculoskeletal   Abdominal   Peds  Hematology negative hematology ROS (+)   Anesthesia Other Findings Past Medical History: No date: Allergic rhinitis No date: Angio-edema No date: Cervical radiculopathy No date: Chronic sinusitis No date: COVID-19 No date: Elevated liver enzymes No date: Fatty liver disease, nonalcoholic No date: GERD (gastroesophageal reflux disease) No date: Peroneal tendonitis, left No date: Plantar fasciitis No date: Situational depression No date: Trigger finger of left  hand No date: Type 2 diabetes mellitus (HCC) No date: 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  Past Surgical History: 2004-2005: BREAST EXCISIONAL BIOPSY; Left     Comment:  benign 2006: CESAREAN SECTION No date: CHOLECYSTECTOMY 05/24/2022: ETHMOIDECTOMY; Left     Comment:  Procedure: ETHMOIDECTOMY- TOTAL WITH FRONTAL SINUS               EXPLORATION;  Surgeon: Geanie Logan, MD;  Location:               [facility];  Service: ENT;  Laterality: Left;                Diabetic No date: GALLBLADDER SURGERY 05/24/2022: IMAGE GUIDED SINUS SURGERY; Left     Comment:  Procedure: IMAGE  GUIDED SINUS SURGERY;  Surgeon:               Geanie Logan, MD;  Location: [facility];                Service: ENT;  Laterality: Left;  PLACED DISK ON OR               CHARGE NURSE DESK 8-23 KP 05/24/2022: MAXILLARY ANTROSTOMY; Left     Comment:  Procedure: MAXILLARY ANTROSTOMY WITH TISSUE REMOVAL;                Surgeon: Geanie Logan, MD;  Location: [facility];  Service: ENT;  Laterality: Left;  BMI    Body Mass Index: 42.20 kg/m      Reproductive/Obstetrics negative OB ROS                             Anesthesia Physical Anesthesia Plan  ASA: 3  Anesthesia Plan: General ETT   Post-op Pain Management:    Induction: Intravenous  PONV Risk Score and Plan: Ondansetron, Dexamethasone, Midazolam and Treatment may vary due to age or medical condition  Airway Management Planned: Oral ETT  Additional Equipment:   Intra-op Plan:   Post-operative Plan: Extubation in OR  Informed Consent: I have reviewed the patients History and Physical, chart,  labs and discussed the procedure including the risks, benefits and alternatives for the proposed anesthesia with the patient or authorized representative who has indicated his/her understanding and acceptance.     Dental Advisory Given  Plan Discussed with: Anesthesiologist, CRNA and Surgeon  Anesthesia Plan Comments: (Patient consented for risks of anesthesia including but not limited to:  - adverse reactions to medications - damage to eyes, teeth, lips or other oral mucosa - nerve damage due to positioning  - sore throat or hoarseness - Damage to heart, brain, nerves, lungs, other parts of body or loss of life  Patient voiced understanding.)       Anesthesia Quick Evaluation

## 2023-04-25 NOTE — Anesthesia Postprocedure Evaluation (Signed)
Anesthesia Post Note  Patient: Tonya Mueller  Procedure(s) Performed: C6-7 ANTERIOR CERVICAL DISCECTOMY AND FUSION (GLOBUS FORGE)  Patient location during evaluation: PACU Anesthesia Type: General Level of consciousness: awake and alert Pain management: pain level controlled Vital Signs Assessment: post-procedure vital signs reviewed and stable Respiratory status: spontaneous breathing, nonlabored ventilation, respiratory function stable and patient connected to nasal cannula oxygen Cardiovascular status: blood pressure returned to baseline and stable Postop Assessment: no apparent nausea or vomiting Anesthetic complications: no   No notable events documented.   Last Vitals:  Vitals:   04/25/23 1045 04/25/23 1107  BP: 137/72 134/77  Pulse: (!) 103 97  Resp: 13 16  Temp:  36.6 C  SpO2: 91% 95%    Last Pain:  Vitals:   04/25/23 1129  TempSrc:   PainSc: Asleep                 Tonya Mueller

## 2023-04-25 NOTE — Transfer of Care (Signed)
Immediate Anesthesia Transfer of Care Note  Patient: Tonya Mueller  Procedure(s) Performed: C6-7 ANTERIOR CERVICAL DISCECTOMY AND FUSION (GLOBUS FORGE)  Patient Location: PACU  Anesthesia Type:General  Level of Consciousness: awake and alert   Airway & Oxygen Therapy: Patient Spontanous Breathing and Patient connected to face mask oxygen  Post-op Assessment: Report given to RN and Post -op Vital signs reviewed and stable  Post vital signs: Reviewed and stable  Last Vitals:  Vitals Value Taken Time  BP 129/72 04/25/23 1000  Temp    Pulse 105 04/25/23 1001  Resp 22 04/25/23 1001  SpO2 100 % 04/25/23 1001  Vitals shown include unfiled device data.  Last Pain:  Vitals:   04/25/23 0625  TempSrc: Temporal  PainSc: 2          Complications: No notable events documented.

## 2023-04-25 NOTE — Discharge Instructions (Signed)
Your surgeon has performed an operation on your cervical spine (neck) to relieve pressure on the spinal cord and/or nerves. This involved making an incision in the front of your neck and removing one or more of the discs that support your spine. Next, a small piece of bone, a titanium plate, and screws were used to fuse two or more of the vertebrae (bones) together.  The following are instructions to help in your recovery once you have been discharged from the hospital. Even if you feel well, it is important that you follow these activity guidelines. If you do not let your neck heal properly from the surgery, you can increase the chance of return of your symptoms and other complications.  * Do not take anti-inflammatory medications for 3 months after surgery (naproxen [Aleve], ibuprofen [Advil, Motrin], etc.). These medications can prevent your bones from healing properly.  Celebrex, if prescribed, is ok to take.  Activity    No bending, lifting, or twisting ("BLT"). Avoid lifting objects heavier than 10 pounds (gallon milk jug).  Where possible, avoid household activities that involve lifting, bending, reaching, pushing, or pulling such as laundry, vacuuming, grocery shopping, and childcare. Try to arrange for help from friends and family for these activities while your back heals.  Increase physical activity slowly as tolerated.  Taking short walks is encouraged, but avoid strenuous exercise. Do not jog, run, bicycle, lift weights, or participate in any other exercises unless specifically allowed by your doctor.  Talk to your doctor before resuming sexual activity.  You should not drive until cleared by your doctor.  Until released by your doctor, you should not return to work or school.  You should rest at home and let your body heal.   You may shower three days after your surgery.  After showering, lightly dab your incision dry. Do not take a tub bath or go swimming until approved by your  doctor at your follow-up appointment.  If your doctor ordered a cervical collar (neck brace) for you, you should wear it whenever you are out of bed. You may remove it when lying down or sleeping, but you should wear it at all other times. Not all neck surgeries require a cervical collar.  If you smoke, we strongly recommend that you quit.  Smoking has been proven to interfere with normal bone healing and will dramatically reduce the success rate of your surgery. Please contact [facility] (800-QUIT-NOW) and use the resources at www.[facility].com for assistance in stopping smoking.  Surgical Incision   If you have a dressing on your incision, you may remove it two days after your surgery. Keep your incision area clean and dry.  If you have staples or stitches on your incision, you should have a follow up scheduled for removal. If you do not have staples or stitches, you will have steri-strips (small pieces of surgical tape) or Dermabond glue. The steri-strips/glue should begin to peel away within about a week (it is fine if the steri-strips fall off before then). If the strips are still in place one week after your surgery, you may gently remove them.  Diet           You may return to your usual diet. However, you may experience discomfort when swallowing in the first month after your surgery. This is normal. You may find that softer foods are more comfortable for you to swallow. Be sure to stay hydrated.  When to Contact Us  You may experience pain in your   neck and/or pain between your shoulder blades. This is normal and should improve in the next few weeks with the help of pain medication, muscle relaxers, and rest. Some patients report that a warm compress on the back of the neck or between the shoulder blades helps.  However, should you experience any of the following, contact us immediately: New numbness or weakness Pain that is progressively getting worse, and is not relieved by your pain  medication, muscle relaxers, rest, and warm compresses Bleeding, redness, swelling, pain, or drainage from surgical incision Chills or flu-like symptoms Fever greater than 101.0 F (38.3 C) Inability to eat, drink fluids, or take medications Problems with bowel or bladder functions Difficulty breathing or shortness of breath Warmth, tenderness, or swelling in your calf Contact Information How to contact us:  If you have any questions/concerns before or after surgery, you can reach [place] at [phone], or you can send a mychart message. We can be reached by phone or mychart 8am-4pm, Monday-Friday.  *Please note: Calls after 4pm are forwarded to a third party answering service. Mychart messages are not routinely monitored during evenings, weekends, and holidays. Please call our office to contact the answering service for urgent concerns during non-business hours.

## 2023-04-25 NOTE — Plan of Care (Signed)
  Problem: Activity: Goal: Ability to avoid complications of mobility impairment will improve Outcome: Progressing Goal: Ability to tolerate increased activity will improve Outcome: Progressing   Problem: Pain Management: Goal: Pain level will decrease Outcome: Progressing   Problem: Bladder/Genitourinary: Goal: Urinary functional status for postoperative course will improve Outcome: Progressing

## 2023-04-25 NOTE — Anesthesia Procedure Notes (Signed)
Procedure Name: Intubation Date/Time: 04/25/2023 7:40 AM  Performed by: Berniece Pap, CRNAPre-anesthesia Checklist: Patient identified, Emergency Drugs available, Suction available and Patient being monitored Patient Re-evaluated:Patient Re-evaluated prior to induction Oxygen Delivery Method: Circle system utilized Preoxygenation: Pre-oxygenation with 100% oxygen Induction Type: IV induction Ventilation: Mask ventilation without difficulty Laryngoscope Size: McGraph and 4 Grade View: Grade I Tube type: Oral Tube size: 7.0 mm Number of attempts: 1 Airway Equipment and Method: Stylet and Oral airway Placement Confirmation: ETT inserted through vocal cords under direct vision, positive ETCO2 and breath sounds checked- equal and bilateral Secured at: 21 cm Tube secured with: Tape Dental Injury: Teeth and Oropharynx as per pre-operative assessment

## 2023-04-25 NOTE — Interval H&P Note (Signed)
History and Physical Interval Note:  04/25/2023 7:15 AM  Tonya Mueller  has presented today for surgery, with the diagnosis of Cervical disc disorder with radiculopathy of cervical region  M50.10  Left arm weakness R29.898.  The various methods of treatment have been discussed with the patient and family. After consideration of risks, benefits and other options for treatment, the patient has consented to  Procedure(s): C6-7 ANTERIOR CERVICAL DISCECTOMY AND FUSION (GLOBUS FORGE) (N/A) as a surgical intervention.  The patient's history has been reviewed, patient examined, no change in status, stable for surgery.  I have reviewed the patient's chart and labs.  Questions were answered to the patient's satisfaction.     Lovenia Kim

## 2023-04-26 ENCOUNTER — Encounter: Payer: Self-pay | Admitting: Neurosurgery

## 2023-04-26 DIAGNOSIS — M50123 Cervical disc disorder at C6-C7 level with radiculopathy: Secondary | ICD-10-CM | POA: Diagnosis not present

## 2023-04-26 LAB — GLUCOSE, CAPILLARY: Glucose-Capillary: 136 mg/dL — ABNORMAL HIGH (ref 70–99)

## 2023-04-26 MED ORDER — PANTOPRAZOLE SODIUM 40 MG PO TBEC
DELAYED_RELEASE_TABLET | ORAL | Status: AC
  Filled 2023-04-26: qty 1

## 2023-04-26 MED ORDER — TIZANIDINE HCL 4 MG PO TABS: 4.0000 mg | ORAL_TABLET | Freq: Three times a day (TID) | ORAL | 0 refills | Status: DC

## 2023-04-26 MED ORDER — SENNA 8.6 MG PO TABS: 1.0000 | ORAL_TABLET | Freq: Every day | ORAL | 0 refills | Status: AC | PRN

## 2023-04-26 MED ORDER — SENNA 8.6 MG PO TABS
ORAL_TABLET | ORAL | Status: AC
  Filled 2023-04-26: qty 1

## 2023-04-26 MED ORDER — ACETAMINOPHEN 500 MG PO TABS
ORAL_TABLET | ORAL | Status: AC
  Filled 2023-04-26: qty 2

## 2023-04-26 MED ORDER — ENOXAPARIN SODIUM 40 MG/0.4ML IJ SOSY
PREFILLED_SYRINGE | INTRAMUSCULAR | Status: AC
  Filled 2023-04-26: qty 0.4

## 2023-04-26 MED ORDER — DOCUSATE SODIUM 100 MG PO CAPS
ORAL_CAPSULE | ORAL | Status: AC
  Filled 2023-04-26: qty 1

## 2023-04-26 MED ORDER — OXYCODONE HCL 5 MG PO TABS: 5.0000 mg | ORAL_TABLET | ORAL | 0 refills | Status: DC | PRN

## 2023-04-26 MED ORDER — KETOROLAC TROMETHAMINE 15 MG/ML IJ SOLN
INTRAMUSCULAR | Status: AC
  Filled 2023-04-26: qty 1

## 2023-04-26 MED ORDER — ATORVASTATIN CALCIUM 20 MG PO TABS
ORAL_TABLET | ORAL | Status: AC
  Filled 2023-04-26: qty 1

## 2023-04-26 NOTE — Progress Notes (Signed)
DISCHARGE NOTE:  Pt given discharge orders and verbalized understanding. Pt wheeled to car by staff, family providing transportation.

## 2023-04-26 NOTE — Progress Notes (Signed)
   Neurosurgery Progress Note  History: KEYLEE VANNORMAN is s/p C6-7 ACDF  POD1: Resolution of preoperative left arm pain.  She is still experiencing numbness in her left hand and fingers as well as expected posterior neck pain.  She has tolerated a normal breakfast this morning.  Physical Exam: Vitals:   04/26/23 0556 04/26/23 0742  BP: 136/75 133/79  Pulse: 79 83  Resp: 18 16  Temp: 97.6 F (36.4 C) 97.6 F (36.4 C)  SpO2: 97% 97%    AA Ox3 CNI  Strength: Side Biceps Triceps Deltoid Interossei Grip Wrist Ext. Wrist Flex.  R 5 5 5 5 5 5 5   L 4+ 4- 4+ 4+ 4+ 4 5     Data:  Other tests/results: none  Assessment/Plan:  KRISTIANE BHANDARI is a 57 year old presenting with C7 radiculopathy and left upper extremity weakness status post C6-7 ACDF with postoperative improvement  - mobilize - pain control - DVT prophylaxis - PTOT;   Manning Charity PA-C Department of Neurosurgery

## 2023-04-26 NOTE — Plan of Care (Signed)
  Problem: Activity: Goal: Ability to avoid complications of mobility impairment will improve Outcome: Progressing   Problem: Pain Management: Goal: Pain level will decrease Outcome: Progressing   Problem: Bladder/Genitourinary: Goal: Urinary functional status for postoperative course will improve Outcome: Progressing

## 2023-04-26 NOTE — Evaluation (Signed)
Occupational Therapy Evaluation Patient Details Name: Tonya Mueller MRN: [id number] DOB: [birth year] Today's Date: 04/26/2023   History of Present Illness 57 y/o female s/p C6-7 discectomy/fusion 9/3.   Clinical Impression   Upon entering the room, pt seated in recliner chair and agreeable to OT intervention. OT reviewed no brace needed but cervical precautions to utilize for self care tasks. Pt verbalized understanding. She has 24/7 assist at home as needed. She is very independent at baseline in all aspects of life. Pt mobilizing in room without assistance without use of AD. Pt obtaining needed items from belongings bag and cues to utilize figure four position but needing assistance to thread clothing onto B LEs. Pt does very well and has no concerns about returning home. All education completed. OT to complete orders at this time.       If plan is discharge home, recommend the following:      Functional Status Assessment  Patient has not had a recent decline in their functional status  Equipment Recommendations  None recommended by OT       Precautions / Restrictions Precautions Precautions: Cervical Required Braces or Orthoses:  (no brace) Restrictions Weight Bearing Restrictions: No      Mobility Bed Mobility               General bed mobility comments: seated in recliner chair    Transfers Overall transfer level: Modified independent Equipment used: None                      Balance Overall balance assessment: Modified Independent                                         ADL either performed or assessed with clinical judgement   ADL Overall ADL's : Needs assistance/impaired                     Lower Body Dressing: Minimal assistance;Sit to/from stand Lower Body Dressing Details (indicate cue type and reason): to thread clothing onto B LEs with use of figure four position Toilet Transfer: Modified Independent            Functional mobility during ADLs: Modified independent       Vision Patient Visual Report: No change from baseline              Pertinent Vitals/Pain Pain Assessment Pain Assessment: 0-10 Pain Score: 2  Pain Location: surgical area Pain Descriptors / Indicators: Aching Pain Intervention(s): Monitored during session, Repositioned, Premedicated before session     Extremity/Trunk Assessment Upper Extremity Assessment Upper Extremity Assessment: Overall WFL for tasks assessed   Lower Extremity Assessment Lower Extremity Assessment: Overall WFL for tasks assessed       Communication Communication Communication: No apparent difficulties   Cognition Arousal: Alert Behavior During Therapy: WFL for tasks assessed/performed Overall Cognitive Status: Within Functional Limits for tasks assessed                                       General Comments  Pt with expected post-op pain/soreness and guarding but has functional mobility w/o safety concerns.            Home Living Family/patient expects to be discharged to:: Private residence Living Arrangements: Spouse/significant other;Children Available Help  at Discharge: Available 24 hours/day Type of Home: House Home Access: Stairs to enter [employer] of Steps: 3 Entrance Stairs-Rails: Can reach both Home Layout: One level               Home Equipment: None          Prior Functioning/Environment Prior Level of Function : Independent/Modified Independent;Working/employed             Mobility Comments: independent with all tasks, working 10 hour days ADLs Comments: Ind with self care and IADLs                 OT Goals(Current goals can be found in the care plan section) Acute Rehab OT Goals Patient Stated Goal: to go home OT Goal Formulation: With patient Time For Goal Achievement: 04/26/23 Potential to Achieve Goals: Fair  OT Frequency:         AM-PAC OT "6 Clicks"  Daily Activity     Outcome Measure Help from another person eating meals?: None Help from another person taking care of personal grooming?: None Help from another person toileting, which includes using toliet, bedpan, or urinal?: None Help from another person bathing (including washing, rinsing, drying)?: None Help from another person to put on and taking off regular upper body clothing?: None Help from another person to put on and taking off regular lower body clothing?: A Little 6 Click Score: 23   End of Session Equipment Utilized During Treatment: Rolling walker (2 wheels) Nurse Communication: Mobility status  Activity Tolerance: Patient tolerated treatment well Patient left: in chair                   Time: 1914-7829 OT Time Calculation (min): 15 min Charges:  OT General Charges $OT Visit: 1 Visit OT Evaluation $OT Eval Low Complexity: 1 Low OT Treatments $Self Care/Home Management : 8-22 mins Jackquline Denmark, MS, OTR/L , CBIS ascom [phone]  04/26/23, 12:00 PM

## 2023-04-26 NOTE — Evaluation (Signed)
Physical Therapy Evaluation Patient Details Name: Tonya Mueller MRN: [id number] DOB: Mar 19, 1966 Today's Date: 04/26/2023  History of Present Illness  57 y/o female s/p C6-7 discectomy/fusion 9/3.  Clinical Impression  Pt pleasant and willing to work with PT, ultimately she sdid well and did not need any assist with any functional mobility, ambulated ~200 ft w/o AD and negotiated up/down steps w/o issue.  She has some mild residual L UE weakness/hesitation as well as expected post-op pain but overall doing well POD1.  Continue with PT per surgeon recommendations/protocol.        If plan is discharge home, recommend the following: Assist for transportation;Assistance with cooking/housework   Can travel by private vehicle        Equipment Recommendations None recommended by PT  Recommendations for Other Services       Functional Status Assessment Patient has had a recent decline in their functional status and demonstrates the ability to make significant improvements in function in a reasonable and predictable amount of time.     Precautions / Restrictions Precautions Precautions: Cervical Required Braces or Orthoses:  (no brace needed) Restrictions Weight Bearing Restrictions: No      Mobility  Bed Mobility Overal bed mobility: Modified Independent             General bed mobility comments: expected guarded posture, but not needing assist    Transfers Overall transfer level: Modified independent Equipment used: None               General transfer comment: able to rise w/o assist, expected hesitation    Ambulation/Gait Ambulation/Gait assistance: Modified independent (Device/Increase time) Gait Distance (Feet): 200 Feet Assistive device: None         General Gait Details: again with expected guarded posturing, but able to ambulate with safe and consistent cadence, no LOBs, buckling or safety concerns.  Stairs Stairs: Yes Stairs assistance:  Supervision Stair Management: Two rails Number of Stairs: 4 General stair comments: Slow but safe management on the steps, light UE use with minimal cuing needed  Wheelchair Mobility     Tilt Bed    Modified Rankin (Stroke Patients Only)       Balance Overall balance assessment: Modified Independent                                           Pertinent Vitals/Pain Pain Assessment Pain Assessment: 0-10 Pain Score: 4  Pain Location: reports increased pain with coughing, swallowing and in general with increased movement/activity    Home Living Family/patient expects to be discharged to:: Private residence Living Arrangements: Spouse/significant other;Children Available Help at Discharge: Available 24 hours/day   Home Access: Stairs to enter Entrance Stairs-Rails: Can reach both Entrance Stairs-Number of Steps: 3   Home Layout: One level Home Equipment: None      Prior Function Prior Level of Function : Independent/Modified Independent             Mobility Comments: independent with all tasks, working 10 hour days       Extremity/Trunk Assessment   Upper Extremity Assessment Upper Extremity Assessment: Overall WFL for tasks assessed (L UE grossly 4-/5 reports improved numbness per pre surgery, R 4/5)    Lower Extremity Assessment Lower Extremity Assessment: Overall WFL for tasks assessed       Communication   Communication Communication: No apparent difficulties  Cognition Arousal: Alert  Behavior During Therapy: WFL for tasks assessed/performed Overall Cognitive Status: Within Functional Limits for tasks assessed                                          General Comments General comments (skin integrity, edema, etc.): Pt with expected post-op pain/soreness and guarding but has functional mobility w/o safety concerns.    Exercises     Assessment/Plan    PT Assessment    PT Problem List         PT Treatment  Interventions      PT Goals (Current goals can be found in the Care Plan section)  Acute Rehab PT Goals Patient Stated Goal: go home PT Goal Formulation: With patient Time For Goal Achievement: 05/09/23 Potential to Achieve Goals: Good    Frequency       Co-evaluation               AM-PAC PT "6 Clicks" Mobility  Outcome Measure Help needed turning from your back to your side while in a flat bed without using bedrails?: None Help needed moving from lying on your back to sitting on the side of a flat bed without using bedrails?: None Help needed moving to and from a bed to a chair (including a wheelchair)?: None Help needed standing up from a chair using your arms (e.g., wheelchair or bedside chair)?: None Help needed to walk in hospital room?: None Help needed climbing 3-5 steps with a railing? : None 6 Click Score: 24    End of Session Equipment Utilized During Treatment: Gait belt Activity Tolerance: Patient tolerated treatment well Patient left: in chair;with call bell/phone within reach Nurse Communication: Mobility status PT Visit Diagnosis: Difficulty in walking, not elsewhere classified (R26.2);Pain;Muscle weakness (generalized) (M62.81)    Time: 4401-0272 PT Time Calculation (min) (ACUTE ONLY): 19 min   Charges:   PT Evaluation $PT Eval Low Complexity: 1 Low   PT General Charges $$ ACUTE PT VISIT: 1 Visit         Malachi Pro, DPT 04/26/2023, 10:01 AM

## 2023-04-26 NOTE — Plan of Care (Signed)
  Problem: Education: Goal: Ability to verbalize activity precautions or restrictions will improve Outcome: Progressing Goal: Knowledge of the prescribed therapeutic regimen will improve Outcome: Progressing   Problem: Pain Management: Goal: Pain level will decrease Outcome: Progressing

## 2023-04-26 NOTE — Discharge Summary (Signed)
Discharge Summary  Patient ID: Tonya Mueller MRN: [id number] DOB/AGE: 01/22/1966 57 y.o.  Admit date: 04/25/2023 Discharge date: 04/26/2023  Admission Diagnoses: Cervical disc herniation with radiculopathy  Discharge Diagnoses:  Principal Problem:   Cervical disc disorder with radiculopathy of cervical region Active Problems:   Left arm weakness   S/P cervical spinal fusion   Discharged Condition: good  Hospital Course:  Tonya Mueller is a 57 year old female presenting with cervical disc herniation and radiculopathy status post C6-7 ACDF.  Her intraoperative course was uncomplicated.  She was admitted overnight for pain control and therapy evaluation.  She reported resolution of her radiating left arm pain on postop day 1 with some residual numbness in her hand and expected neck pain.  She was swallowing well on tolerating a regular diet.  She was seen by therapy and deemed appropriate for discharge home on postop day 1.  She was given prescriptions for oxycodone, senna, and Zanaflex.  Consults: None  Significant Diagnostic Studies: none  Treatments: surgery: As above.  Please see separately dictated operative report for further details.  Discharge Exam: Blood pressure 133/79, pulse 83, temperature 97.6 F (36.4 C), temperature source Temporal, resp. rate 16, height 5\' 1"  (1.549 m), weight 101.3 kg, last menstrual period 05/13/2013, SpO2 97%.  AA Ox3 CNI   Strength: Side Biceps Triceps Deltoid Interossei Grip Wrist Ext. Wrist Flex.  R 5 5 5 5 5 5 5   L 4+ 4- 4+ 4+ 4+ 4 5      incision clean dry and intact with Dermabond and Tegaderm in place.  Disposition: Discharge disposition: 01-Home or Self Care       Discharge Instructions     Incentive spirometry RT   Complete by: As directed       Allergies as of 04/26/2023       Reactions   Cortisone Other (See Comments)   Patient experienced severe stomach pains and bloating; 05/24/22: pt has had an injection recently  without issues        Medication List     STOP taking these medications    meloxicam 15 MG tablet Commonly known as: MOBIC       TAKE these medications    atorvastatin 20 MG tablet Commonly known as: LIPITOR Take 20 mg by mouth daily.   DULoxetine 60 MG capsule Commonly known as: CYMBALTA Take 60 mg by mouth daily.   EPINEPHrine 0.3 mg/0.3 mL Soaj injection Commonly known as: EPI-PEN Inject 0.3 mg into the muscle as needed.   fluticasone 50 MCG/ACT nasal spray Commonly known as: FLONASE Place into both nostrils daily.   gabapentin 100 MG capsule Commonly known as: NEURONTIN Take 100 mg by mouth at bedtime. 400 mg at bedtime   ipratropium 0.03 % nasal spray Commonly known as: ATROVENT Place 2 sprays into both nostrils every 12 (twelve) hours.   levocetirizine 5 MG tablet Commonly known as: XYZAL Take 5 mg by mouth every evening.   omeprazole 20 MG capsule Commonly known as: PRILOSEC Take 20 mg by mouth daily.   oxyCODONE 5 MG immediate release tablet Commonly known as: Oxy IR/ROXICODONE Take 1 tablet (5 mg total) by mouth every 3 (three) hours as needed for moderate pain ((score 4 to 6)).   Ozempic (1 MG/DOSE) 4 MG/3ML Sopn Generic drug: Semaglutide (1 MG/DOSE) Inject 1 mg into the skin once a week.   senna 8.6 MG Tabs tablet Commonly known as: SENOKOT Take 1 tablet (8.6 mg total) by mouth daily as needed for  mild constipation.   tiZANidine 4 MG tablet Commonly known as: Zanaflex Take 1 tablet (4 mg total) by mouth 3 (three) times daily.   triamcinolone cream 0.1 % Commonly known as: KENALOG Apply 1 Application topically 2 (two) times daily as needed.         Signed: Susanne Borders 04/26/2023, 10:31 AM

## 2023-05-10 ENCOUNTER — Encounter: Payer: Self-pay | Admitting: Neurosurgery

## 2023-05-10 ENCOUNTER — Ambulatory Visit (INDEPENDENT_AMBULATORY_CARE_PROVIDER_SITE_OTHER): Payer: BC Managed Care – PPO | Admitting: Neurosurgery

## 2023-05-10 VITALS — BP 118/72 | Ht 61.0 in | Wt 223.0 lb

## 2023-05-10 DIAGNOSIS — M5010 Cervical disc disorder with radiculopathy, unspecified cervical region: Secondary | ICD-10-CM

## 2023-05-10 DIAGNOSIS — R29898 Other symptoms and signs involving the musculoskeletal system: Secondary | ICD-10-CM

## 2023-05-10 DIAGNOSIS — Z981 Arthrodesis status: Secondary | ICD-10-CM

## 2023-05-10 DIAGNOSIS — Z09 Encounter for follow-up examination after completed treatment for conditions other than malignant neoplasm: Secondary | ICD-10-CM

## 2023-05-10 NOTE — Progress Notes (Signed)
   DOS: 04/25/2023  HISTORY OF PRESENT ILLNESS: 05/10/2023 [name] is status post C6-7 anterior cervical discectomy and fusion.  She is having expected postoperative pain in her neck and shoulders.  She has not noticed any worsening in her symptoms.  She does feel that her immediate discomfort after surgery has been slowly improving.  She is able to swallow and maintain her oral intake.  Not noticed a significant voice change.  PHYSICAL EXAMINATION:   Vitals:   05/10/23 1123  BP: 118/72   General: Patient is well developed, well nourished, calm, collected, and in no apparent distress.  NEUROLOGICAL:  General: In no acute distress.  Awake, alert, oriented to person, place, and time. Pupils equal round and reactive to light.   Strength: No major deficits noted in her left upper extremity.  Strength appears to be somewhat improved.  Incision c/d/i   ROS (Neurologic): Negative except as noted above  IMAGING: No interval imaging to review   ASSESSMENT/PLAN:  Tonya Mueller is having the expected postoperative recovery after a cervical decompression and fusion.  She is currently 2 weeks postoperative.  Not noticing any new deficits.  Not noticing any new worsening.  She does have postoperative neck pain in the posterior aspect of her neck which is expected, we discussed with her the posterior symptoms are due to the expansion of the facet joints.  She is currently tolerating oral intake.  We like to see her back in approximately 4 weeks for a 6-week postop visit.  At this point her left upper extremity has slightly improved strength, she continues to have her preoperative pain.  We let her know that she will likely see continued improvements over the next few weeks to months.  Lovenia Kim, MD/MSCR Department of Neurosurgery

## 2023-05-21 ENCOUNTER — Other Ambulatory Visit: Payer: Self-pay | Admitting: Neurosurgery

## 2023-06-06 ENCOUNTER — Other Ambulatory Visit: Payer: Self-pay | Admitting: Family Medicine

## 2023-06-06 DIAGNOSIS — M5010 Cervical disc disorder with radiculopathy, unspecified cervical region: Secondary | ICD-10-CM

## 2023-06-07 ENCOUNTER — Ambulatory Visit (INDEPENDENT_AMBULATORY_CARE_PROVIDER_SITE_OTHER): Payer: Self-pay | Admitting: Neurosurgery

## 2023-06-07 ENCOUNTER — Ambulatory Visit
Admission: RE | Admit: 2023-06-07 | Discharge: 2023-06-07 | Disposition: A | Discharge status: Home / Self Care | Payer: Self-pay | Source: Ambulatory Visit | Attending: Neurosurgery | Admitting: Neurosurgery

## 2023-06-07 ENCOUNTER — Encounter: Payer: Self-pay | Admitting: Neurosurgery

## 2023-06-07 ENCOUNTER — Ambulatory Visit
Admission: RE | Admit: 2023-06-07 | Discharge: 2023-06-07 | Disposition: A | Discharge status: Home / Self Care | Payer: Self-pay | Attending: Neurosurgery | Admitting: Neurosurgery

## 2023-06-07 VITALS — BP 122/80 | Temp 97.9°F | Ht 61.0 in | Wt 223.0 lb

## 2023-06-07 DIAGNOSIS — M5010 Cervical disc disorder with radiculopathy, unspecified cervical region: Secondary | ICD-10-CM | POA: Insufficient documentation

## 2023-06-07 DIAGNOSIS — Z981 Arthrodesis status: Secondary | ICD-10-CM

## 2023-06-07 DIAGNOSIS — R29898 Other symptoms and signs involving the musculoskeletal system: Secondary | ICD-10-CM

## 2023-06-07 DIAGNOSIS — Z09 Encounter for follow-up examination after completed treatment for conditions other than malignant neoplasm: Secondary | ICD-10-CM

## 2023-06-07 NOTE — Progress Notes (Unsigned)
   DOS: 04/25/2023  HISTORY OF PRESENT ILLNESS: 06/07/2023 [name] is status post C6-7 anterior cervical discectomy and fusion.  She has a resolution of her arm pain.  Continues to have postoperative neck and shoulder pain.  She has not noticed any new weakness.  She had some swallowing difficulty at first but has had continued improvement.  It is still not completely resolved however.  PHYSICAL EXAMINATION:   Vitals:   06/07/23 1116  BP: 122/80  Temp: 97.9 F (36.6 C)   General: Patient is well developed, well nourished, calm, collected, and in no apparent distress.  NEUROLOGICAL:  General: In no acute distress.  Awake, alert, oriented to person, place, and time. Pupils equal round and reactive to light.   Strength: No major deficits noted in her left upper extremity.  Strength appears to be somewhat improved.  Incision c/d/i   ROS (Neurologic): Negative except as noted above  IMAGING:   ASSESSMENT/PLAN:  Tonya Mueller is having the expected postoperative recovery after a cervical decompression and fusion.  She is currently 6 weeks postoperative.  She continues to have resolution of her radicular pain.  Has continued postoperative posterior neck pain.  She also has some bilateral shoulder pain as well.  We discussed with her that this is likely from the distraction of the vertebral bodies and placement of interbody grafts which often gives people posterior neck pain.  This should resolve with time.  She has no new deficits.  Her swallowing has been slowly improving but not completely resolved yet, we expect her to continue to improve.  Her x-rays were reviewed with her in clinic today.  No acute hardware failure.  Lovenia Kim, MD/MSCR Department of Neurosurgery

## 2023-06-09 ENCOUNTER — Encounter: Admission: RE | Payer: Self-pay | Source: Home / Self Care

## 2023-06-09 ENCOUNTER — Ambulatory Visit: Admission: RE | Admit: 2023-06-09 | Payer: BC Managed Care – PPO | Source: Home / Self Care

## 2023-06-09 SURGERY — COLONOSCOPY WITH PROPOFOL
Anesthesia: General

## 2023-07-14 ENCOUNTER — Other Ambulatory Visit: Payer: Self-pay

## 2023-07-14 DIAGNOSIS — M5010 Cervical disc disorder with radiculopathy, unspecified cervical region: Secondary | ICD-10-CM

## 2023-07-17 ENCOUNTER — Ambulatory Visit
Admission: RE | Admit: 2023-07-17 | Discharge: 2023-07-17 | Disposition: A | Discharge status: Home / Self Care | Payer: Medicaid Other | Attending: Neurosurgery | Admitting: Neurosurgery

## 2023-07-17 ENCOUNTER — Ambulatory Visit
Admission: RE | Admit: 2023-07-17 | Discharge: 2023-07-17 | Disposition: A | Discharge status: Home / Self Care | Payer: Medicaid Other | Source: Ambulatory Visit | Attending: Neurosurgery | Admitting: Neurosurgery

## 2023-07-17 ENCOUNTER — Encounter: Payer: BC Managed Care – PPO | Admitting: Orthopedic Surgery

## 2023-07-17 ENCOUNTER — Ambulatory Visit: Payer: Medicaid Other | Admitting: Neurosurgery

## 2023-07-17 ENCOUNTER — Encounter: Payer: Self-pay | Admitting: Family Medicine

## 2023-07-17 ENCOUNTER — Telehealth: Payer: Self-pay | Admitting: Neurosurgery

## 2023-07-17 ENCOUNTER — Encounter: Payer: Self-pay | Admitting: Neurosurgery

## 2023-07-17 VITALS — BP 128/78 | Ht 61.0 in | Wt 223.0 lb

## 2023-07-17 DIAGNOSIS — M5010 Cervical disc disorder with radiculopathy, unspecified cervical region: Secondary | ICD-10-CM

## 2023-07-17 DIAGNOSIS — Z09 Encounter for follow-up examination after completed treatment for conditions other than malignant neoplasm: Secondary | ICD-10-CM

## 2023-07-17 DIAGNOSIS — M542 Cervicalgia: Secondary | ICD-10-CM

## 2023-07-17 DIAGNOSIS — Z981 Arthrodesis status: Secondary | ICD-10-CM

## 2023-07-17 NOTE — Telephone Encounter (Signed)
Ok to keep her out until 09/18/23?

## 2023-07-17 NOTE — Progress Notes (Unsigned)
   DOS: 04/25/2023  HISTORY OF PRESENT ILLNESS: 07/17/2023 [name] is status post C6-7 anterior cervical discectomy and fusion.  She continues to get intermittent neck pain as well as pain in her arm.  This originally had some resolution however she feels like she has had a recurrence of the arm pain.  She was mostly having neck and shoulder pain at her last visit.  Her swallowing has improved and she is able to continue to maintain her weight.  PHYSICAL EXAMINATION:   Vitals:   07/17/23 1145  BP: 128/78   General: Patient is well developed, well nourished, calm, collected, and in no apparent distress.  NEUROLOGICAL:  General: In no acute distress.  Awake, alert, oriented to person, place, and time. Pupils equal round and reactive to light.   Strength: Improvement in her motor exam, continues to have some giveway phenomenon that is not reproducible.  Incision c/d/i   ROS (Neurologic): Negative except as noted above  IMAGING:   ASSESSMENT/PLAN:  Tonya Mueller is having some recurrence of her left upper extremity pain.  She is currently 3 months postop.  At this point we would expect her to have an improvement.  Given her ongoing pain without any new neurologic deficits we will plan on getting a repeat MRI as well as an EMG to see if there are any clear targets.  Her physical exam shows continued baseline strength without any worsening deficits, in fact she has shown some improvement from her preoperative testing.  Would like to follow-up with her after an EMG as well as a nerve conduction study and a new MRI to evaluate her decompression, nerve testing, and current symptoms.  Potentially will have a injection option for her or if further decompression can sometimes be needed after an anterior cervical discectomy and fusion.  Her x-rays were reviewed with no evidence of acute changes.    Lovenia Kim, MD/MSCR Department of Neurosurgery

## 2023-07-17 NOTE — Telephone Encounter (Signed)
Patient notified note is available on her Mychart.

## 2023-07-17 NOTE — Telephone Encounter (Signed)
Patient and her son Tonya Mueller are on the phone. Dr.Smith did not discuss her return to work capacity. She is scheduled for her follow-up appt in 2 months. Do you want to keep her out of work until her next appt 09/18/23? If so, can you type up a note please so I can send to [employer]. She feels that she is unable to return to work right now because she is required to do a lot of lifting.

## 2023-08-09 ENCOUNTER — Other Ambulatory Visit: Payer: Self-pay

## 2023-08-09 ENCOUNTER — Encounter: Payer: Self-pay | Admitting: Neurology

## 2023-08-09 DIAGNOSIS — R202 Paresthesia of skin: Secondary | ICD-10-CM

## 2023-08-22 ENCOUNTER — Ambulatory Visit
Admission: RE | Admit: 2023-08-22 | Discharge: 2023-08-22 | Disposition: A | Discharge status: Home / Self Care | Payer: Medicaid Other | Source: Ambulatory Visit | Attending: Neurosurgery | Admitting: Neurosurgery

## 2023-08-22 DIAGNOSIS — M542 Cervicalgia: Secondary | ICD-10-CM | POA: Insufficient documentation

## 2023-08-28 ENCOUNTER — Other Ambulatory Visit: Payer: Self-pay | Admitting: Gerontology

## 2023-08-28 DIAGNOSIS — Z1231 Encounter for screening mammogram for malignant neoplasm of breast: Secondary | ICD-10-CM

## 2023-09-08 ENCOUNTER — Encounter: Payer: Self-pay | Admitting: Neurology

## 2023-09-08 ENCOUNTER — Encounter: Payer: Medicaid Other | Admitting: Neurology

## 2023-09-14 ENCOUNTER — Ambulatory Visit: Payer: Medicaid Other | Admitting: Neurology

## 2023-09-14 DIAGNOSIS — R202 Paresthesia of skin: Secondary | ICD-10-CM | POA: Diagnosis not present

## 2023-09-14 DIAGNOSIS — M5412 Radiculopathy, cervical region: Secondary | ICD-10-CM

## 2023-09-14 NOTE — Procedures (Signed)
[facility]  [address] [place], [address]  [place], [place] [address] Tel: [phone] Fax: [phone] Test Date:  09/14/2023  Patient: Tonya Mueller DOB: 16-May-1966 Physician: Nita Sickle, DO  Sex: Female Height: 5\' 1"  Ref Phys: Ernestine Mcmurray, MD  ID#: [id number]   Technician:    History: This is a 58 year old female with history of ACDF at C6-7 referred for evaluation of bilateral arm pain.  NCV & EMG Findings: Extensive electrodiagnostic testing of the right upper extremity and additional studies of the left shows: Bilateral median, ulnar, and mixed palmar sensory responses are within normal limits. Bilateral median and ulnar motor responses are within normal limits. 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  These findings are not present in the right upper extremity.  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 There is no evidence of carpal tunnel syndrome or ulnar neuropathy affecting the upper extremities.   ___________________________ Nita Sickle, DO    Nerve Conduction Studies   Stim Site NR Peak (ms) Norm Peak (ms) O-P Amp (V) Norm O-P Amp  Left Median Anti Sensory (2nd Digit)  32 C  Wrist    2.6 <3.6 36.1 >15  Right Median Anti Sensory (2nd Digit)  32 C  Wrist    2.9 <3.6 30.8 >15  Left Ulnar Anti Sensory (5th Digit)  32 C  Wrist    2.4 <3.1 29.4 >10  Right Ulnar Anti Sensory (5th Digit)  32 C  Wrist    2.3 <3.1 28.1 >10     Stim Site NR Onset (ms) Norm Onset (ms) O-P Amp (mV) Norm O-P Amp Site1 Site2 Delta-0 (ms) Dist (cm) Vel (m/s) Norm Vel (m/s)  Left Median Motor (Abd Poll Brev)  32 C  Wrist    2.5 <4.0 12.4 >6 Elbow Wrist 4.6 29.0 63 >50  Elbow    7.1  12.0         Right Median Motor (Abd Poll Brev)  32 C  Wrist    2.8 <4.0 14.0 >6 Elbow Wrist 4.8 28.0 58 >50  Elbow    7.6  13.8         Left Ulnar Motor (Abd Dig Minimi)  32  C  Wrist    2.3 <3.1 9.5 >7 B Elbow Wrist 3.6 23.0 64 >50  B Elbow    5.9  9.2  A Elbow B Elbow 1.3 9.0 69 >50  A Elbow    7.2  8.7         Right Ulnar Motor (Abd Dig Minimi)  32 C  Wrist    2.0 <3.1 12.5 >7 B Elbow Wrist 3.6 23.0 64 >50  B Elbow    5.6  12.5  A Elbow B Elbow 1.5 9.0 60 >50  A Elbow    7.1  12.0            Stim Site NR Peak (ms) Norm Peak (ms) P-T Amp (V) Site1 Site2 Delta-P (ms) Norm Delta (ms)  Left Median/Ulnar Palm Comparison (Wrist - 8cm)  32 C  Median Palm    1.3 <2.2 72.1 Median Palm Ulnar Palm 0.0   Ulnar Palm    1.3 <2.2 20.1      Right Median/Ulnar Palm Comparison (Wrist - 8cm)  32 C  Median Palm    1.6 <2.2 53.5 Median Palm Ulnar Palm 0.3   Ulnar Palm    1.3 <2.2 15.3  Electromyography   Side Muscle Ins.Act Fibs Fasc Recrt Amp Dur Poly Activation Comment  Right 1stDorInt Nml Nml Nml Nml Nml Nml Nml Nml N/A  Right Abd Poll Brev Nml Nml Nml Nml Nml Nml Nml Nml N/A  Right PronatorTeres Nml Nml Nml Nml Nml Nml Nml Nml N/A  Right Biceps Nml Nml Nml Nml Nml Nml Nml Nml N/A  Right Triceps Nml Nml Nml Nml Nml Nml Nml Nml N/A  Right Deltoid Nml Nml Nml Nml Nml Nml Nml Nml N/A  Left 1stDorInt Nml Nml Nml Nml Nml Nml Nml Nml N/A  Left PronatorTeres Nml Nml Nml *1- *1+ *1+ *1+ Nml N/A  Left Biceps Nml Nml Nml Nml Nml Nml Nml Nml N/A  Left Triceps Nml Nml Nml *1- *1+ *1+ *1+ Nml N/A  Left Deltoid Nml Nml Nml Nml Nml Nml Nml Nml N/A      Waveforms:

## 2023-09-15 ENCOUNTER — Other Ambulatory Visit: Payer: Self-pay

## 2023-09-15 DIAGNOSIS — R29898 Other symptoms and signs involving the musculoskeletal system: Secondary | ICD-10-CM

## 2023-09-18 ENCOUNTER — Ambulatory Visit (INDEPENDENT_AMBULATORY_CARE_PROVIDER_SITE_OTHER): Payer: Medicaid Other | Admitting: Neurosurgery

## 2023-09-18 ENCOUNTER — Ambulatory Visit
Admission: RE | Admit: 2023-09-18 | Discharge: 2023-09-18 | Disposition: A | Discharge status: Home / Self Care | Payer: Medicaid Other | Source: Ambulatory Visit | Attending: Neurosurgery | Admitting: Neurosurgery

## 2023-09-18 ENCOUNTER — Ambulatory Visit
Admission: RE | Admit: 2023-09-18 | Discharge: 2023-09-18 | Disposition: A | Discharge status: Home / Self Care | Payer: Medicaid Other | Attending: Neurosurgery | Admitting: Neurosurgery

## 2023-09-18 VITALS — BP 130/82 | Ht 61.0 in | Wt 223.0 lb

## 2023-09-18 DIAGNOSIS — R29898 Other symptoms and signs involving the musculoskeletal system: Secondary | ICD-10-CM | POA: Insufficient documentation

## 2023-09-18 DIAGNOSIS — M50123 Cervical disc disorder at C6-C7 level with radiculopathy: Secondary | ICD-10-CM | POA: Diagnosis not present

## 2023-09-18 DIAGNOSIS — Z981 Arthrodesis status: Secondary | ICD-10-CM | POA: Diagnosis not present

## 2023-09-18 DIAGNOSIS — M5010 Cervical disc disorder with radiculopathy, unspecified cervical region: Secondary | ICD-10-CM

## 2023-09-18 DIAGNOSIS — M542 Cervicalgia: Secondary | ICD-10-CM

## 2023-09-18 NOTE — Patient Instructions (Signed)
LOCAL PHYSICAL THERAPY  [facility]  [address]  [place], [place] [address]  [phone]  [facility]  [address] [place], [place] [address]  [phone]  [facility] (2 locations)  [address]  [place], [place] [address]  [phone]          or  [address]  [place], [place] [address]  [phone]  [facility]  [address]  [place], [place] [address]  [phone]  **dry needling**  [facility]  [address]  [place], [place] [address]  [phone]  Fax: [phone]  ** Aquatic therapy[address] [place], [place] [address] [phone] **Aquatic therapy**  [facility]  [facility]  [address]  [place], [place] [address]  [phone]  [facility]  [address]  [place], [place] [address]  [phone]  [facility]  [address]   Janora Norlander  [place], [place] [address]  [phone]  [facility]  [address]  [place], [place] [address]  [phone]  **dry needling**   PELVIC FLOOR/SI JOINT  [facility]  Mariane Masters, PT  [email or web address]   [facility]  [address]  [place], [place] [address]  [phone]   [facility]  [address], [place] [address]  [phone]   [facility], [place]  [facility]  Raymond Gurney, PT  [address]  [place], [place] [address] [phone]   Samara Deist  [facility]  [address]  [phone]   [facility]  [address]  [phone]   [facility]  Annamaria Boots [facility]  [address]  [place], [place] [address]  [phone]   **No Aetna or medicaid**  [facility]  [phone] [address] [place] [address]  [phone]  [facility]  [address]  [place], [place] [address]  [phone]  Dannial Monarch, DC  [address], [place] [address]  [phone]  [facility]  [address], [place] [address]  [phone]  [facility]  [address]  [place], [place] [address]  [phone]  [facility]  (3 locations)  [address]  [place], [place] [address]  [phone]  **dry needling**           or  [address] [place], [place] [address]  [phone]  **Additionally has Gloris [place], OT**           or  [address]  [place], [place] [address]  [phone]  **Pediatric therapy**  [facility]  [address]  [phone]  **dry needlingVerdie Drown [facility]  [address]  [place], [place] [address]  [phone]  [facility]   (Inside [address] Fitness)  [address]  [place], [place] [address]  [phone]  **dry needling**  **MEDICAID or UNINSURED** [facility] dept. Of Physical Therapy [place], [place] [address] [phone]  [facility]  [address] [place], [place] [address]  [phone]   [facility]  [address]  [place], [place] [address]  [phone]   Doreatha Martin  [facility]  [address] [place], [place] [address]  [phone]   [facility]  [address]  [place], [place] [address]  [phone]   [facility]  [address] [place], [place] [address]  [phone]  [facility]  [address]  [place], [place] [address]  [phone]  AQUATIC  [facility]  [employer] [place]  [facility]  [place]  *Residents only*   [facility] [employer]  *[facility]  Exercise class  [facility]  Exercise class  [facility]  [address]  [place], [place]   [phone]  [facility]  [address]  [place], [place] [address]  [phone]   [place], [place]  [facility]  [address]  [phone]   [facility]  [facility]  [address]  [phone]           or  [address]  [phone]   [facility] Support [facility]   Provides education and support and practical information for coping with arthritis for arthritis sufferers and their families.   When: 12:15 - 1:30 p.m. the second Monday of each month, March through December  Info: Call Rehabilitation Services at [phone]

## 2023-09-18 NOTE — Progress Notes (Unsigned)
   DOS: 04/25/2023  HISTORY OF PRESENT ILLNESS: 07/17/2023 Ms. Olubunmi Rothenberger is status post C6-7 anterior cervical discectomy and fusion.  She continues to get intermittent neck pain as well as pain in her arm.  This originally had some resolution however she feels like she has had a recurrence of the arm pain.  She was mostly having neck and shoulder pain at her last visit.  Her swallowing has improved and she is able to continue to maintain her weight.  PHYSICAL EXAMINATION:   Vitals:   09/18/23 1202  BP: 130/82   General: Patient is well developed, well nourished, calm, collected, and in no apparent distress.  NEUROLOGICAL:  General: In no acute distress.  Awake, alert, oriented to person, place, and time. Pupils equal round and reactive to light.   Strength: Improvement in her motor exam, continues to have some giveway phenomenon that is not reproducible.  Incision c/d/i   ROS (Neurologic): Negative except as noted above  IMAGING:   ASSESSMENT/PLAN:  Iliza Blankenbeckler is having some recurrence of her left upper extremity pain.  She is currently 3 months postop.  At this point we would expect her to have an improvement.  Given her ongoing pain without any new neurologic deficits we will plan on getting a repeat MRI as well as an EMG to see if there are any clear targets.  Her physical exam shows continued baseline strength without any worsening deficits, in fact she has shown some improvement from her preoperative testing.  Would like to follow-up with her after an EMG as well as a nerve conduction study and a new MRI to evaluate her decompression, nerve testing, and current symptoms.  Potentially will have a injection option for her or if further decompression can sometimes be needed after an anterior cervical discectomy and fusion.  Her x-rays were reviewed with no evidence of acute changes.    Lovenia Kim, MD/MSCR Department of Neurosurgery    Nerve pain better now mostly  neck  Mariah Milling - inejction?

## 2023-09-19 ENCOUNTER — Telehealth: Payer: Self-pay | Admitting: Neurosurgery

## 2023-09-19 NOTE — Telephone Encounter (Signed)
Patient walked into the office this morning asking for a doctor's note to be faxed to [employer] on her behalf. Dr.Smith told her yesterday at her appt that she would be out of work an additional 2 months while she continues with additional testing. There is no documentation in the office visit from yesterday. Can you provide a note so I can fax to [employer].

## 2023-09-21 NOTE — Telephone Encounter (Signed)
The patient is calling back. She needs a work status note to send to [employer] her disability. Also did you place a referral for Dr.Morales for another injection?

## 2023-09-22 NOTE — Telephone Encounter (Signed)
Dr Katrinka Blazing will need to place referrals. However, I completed the work note for her to be out for 2 months.

## 2023-09-22 NOTE — Telephone Encounter (Signed)
Patient calling back. Have you completed her work note? Are you putting in a new referral to Dr.Morales for another injection?

## 2023-09-27 ENCOUNTER — Other Ambulatory Visit: Payer: Self-pay | Admitting: Neurosurgery

## 2023-09-27 DIAGNOSIS — Z981 Arthrodesis status: Secondary | ICD-10-CM

## 2023-09-27 DIAGNOSIS — M5010 Cervical disc disorder with radiculopathy, unspecified cervical region: Secondary | ICD-10-CM

## 2023-09-27 DIAGNOSIS — M542 Cervicalgia: Secondary | ICD-10-CM

## 2023-09-27 NOTE — Telephone Encounter (Signed)
I will call and notify the patient.

## 2023-09-27 NOTE — Telephone Encounter (Signed)
 Patient came by the office today, please place referral for injection with Dr.Morales.

## 2023-09-27 NOTE — Telephone Encounter (Signed)
 Notified patient. She is aware that they will call her to schedule.

## 2023-10-25 ENCOUNTER — Encounter: Payer: Self-pay | Admitting: Physical Medicine & Rehabilitation

## 2023-10-25 ENCOUNTER — Other Ambulatory Visit: Payer: Self-pay | Admitting: Physical Medicine & Rehabilitation

## 2023-10-25 DIAGNOSIS — G8929 Other chronic pain: Secondary | ICD-10-CM

## 2023-10-26 ENCOUNTER — Encounter: Payer: Self-pay | Admitting: Physical Medicine & Rehabilitation

## 2023-10-27 ENCOUNTER — Other Ambulatory Visit

## 2023-11-02 ENCOUNTER — Other Ambulatory Visit: Payer: Self-pay | Admitting: Neurology

## 2023-11-02 DIAGNOSIS — S060X0D Concussion without loss of consciousness, subsequent encounter: Secondary | ICD-10-CM

## 2023-11-07 ENCOUNTER — Ambulatory Visit
Admission: RE | Admit: 2023-11-07 | Discharge: 2023-11-07 | Disposition: A | Discharge status: Home / Self Care | Source: Ambulatory Visit | Attending: Neurology | Admitting: Neurology

## 2023-11-07 DIAGNOSIS — S060X0D Concussion without loss of consciousness, subsequent encounter: Secondary | ICD-10-CM | POA: Diagnosis present

## 2023-11-08 ENCOUNTER — Other Ambulatory Visit: Payer: Self-pay | Admitting: Physical Medicine & Rehabilitation

## 2023-11-08 DIAGNOSIS — M546 Pain in thoracic spine: Secondary | ICD-10-CM

## 2023-11-09 ENCOUNTER — Ambulatory Visit: Attending: Neurosurgery

## 2023-11-09 ENCOUNTER — Ambulatory Visit: Admitting: Physical Therapy

## 2023-11-09 DIAGNOSIS — M5010 Cervical disc disorder with radiculopathy, unspecified cervical region: Secondary | ICD-10-CM | POA: Insufficient documentation

## 2023-11-09 DIAGNOSIS — M542 Cervicalgia: Secondary | ICD-10-CM | POA: Insufficient documentation

## 2023-11-09 DIAGNOSIS — G8929 Other chronic pain: Secondary | ICD-10-CM | POA: Diagnosis present

## 2023-11-09 DIAGNOSIS — M546 Pain in thoracic spine: Secondary | ICD-10-CM | POA: Diagnosis present

## 2023-11-09 DIAGNOSIS — M6281 Muscle weakness (generalized): Secondary | ICD-10-CM | POA: Insufficient documentation

## 2023-11-09 DIAGNOSIS — R29898 Other symptoms and signs involving the musculoskeletal system: Secondary | ICD-10-CM | POA: Diagnosis not present

## 2023-11-09 DIAGNOSIS — Z981 Arthrodesis status: Secondary | ICD-10-CM | POA: Diagnosis not present
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
## 2023-11-10 ENCOUNTER — Ambulatory Visit
Admission: RE | Admit: 2023-11-10 | Discharge: 2023-11-10 | Disposition: A | Discharge status: Home / Self Care | Source: Ambulatory Visit | Attending: Physical Medicine & Rehabilitation | Admitting: Physical Medicine & Rehabilitation

## 2023-11-10 DIAGNOSIS — M546 Pain in thoracic spine: Secondary | ICD-10-CM

## 2023-11-14 ENCOUNTER — Ambulatory Visit: Admitting: Occupational Therapy

## 2023-11-14 DIAGNOSIS — M542 Cervicalgia: Secondary | ICD-10-CM

## 2023-11-14 NOTE — Therapy (Signed)
 [facility] [address], [place], [address] Phone: [phone]   Fax:  [phone]  Occupational Therapy Screen  Patient Details  Name: Tonya Mueller MRN: [id number] Date of Birth: [birth year] No data recorded  Encounter Date: 11/14/2023   OT End of Session - 11/14/23 1843     Visit Number 0             Past Medical History:  Diagnosis Date   Allergic rhinitis    Angio-edema    Cervical radiculopathy    Chronic sinusitis    COVID-19    Elevated liver enzymes    Fatty liver disease, nonalcoholic    GERD (gastroesophageal reflux disease)    Peroneal tendonitis, left    Plantar fasciitis    Situational depression    Trigger finger of left  hand    Type 2 diabetes mellitus (HCC)    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     Past Surgical History:  Procedure Laterality Date   ANTERIOR CERVICAL DECOMP/DISCECTOMY FUSION N/A 04/25/2023   Procedure: C6-7 ANTERIOR CERVICAL DISCECTOMY AND FUSION (GLOBUS FORGE);  Surgeon: Lovenia Kim, MD;  Location: [facility];  Service: Neurosurgery;  Laterality: N/A;   BREAST EXCISIONAL BIOPSY Left 2004-2005   benign   CESAREAN SECTION  2006   CHOLECYSTECTOMY     ETHMOIDECTOMY Left 05/24/2022   Procedure: ETHMOIDECTOMY- TOTAL WITH FRONTAL SINUS EXPLORATION;  Surgeon: Geanie Logan, MD;  Location: [facility];  Service: ENT;  Laterality: Left;  Diabetic   GALLBLADDER SURGERY     IMAGE GUIDED SINUS SURGERY Left 05/24/2022   Procedure: IMAGE GUIDED SINUS SURGERY;  Surgeon: Geanie Logan, MD;  Location: [facility];  Service: ENT;  Laterality: Left;  PLACED DISK ON OR CHARGE NURSE DESK 8-23 KP   MAXILLARY ANTROSTOMY Left 05/24/2022   Procedure: MAXILLARY ANTROSTOMY WITH TISSUE REMOVAL;  Surgeon: Geanie Logan, MD;  Location: [facility];  Service: ENT;  Laterality: Left;    There were no vitals filed for this visit.  Pt arrive - upon discussion pt has duplicate  orders for cervicalgia - from 2 different MD's - pt already had PT eval and under the care of PT  No needs for OT intervention Pt in agreement     Visit Diagnosis: Cervicalgia    Problem List Patient Active Problem List   Diagnosis Date Noted   S/P cervical spinal fusion 04/25/2023   Cervical disc disorder with radiculopathy of cervical region 04/05/2023   Left arm weakness 04/05/2023    Oletta Cohn, OTR/L,CLT 11/14/2023, 6:44 PM  [facility] [address], [place], [address] Phone: [phone]   Fax:  [phone]  Name: MERRIN MCVICKER MRN: [id number] Date of Birth: November 05, 1965

## 2023-11-15 ENCOUNTER — Telehealth: Payer: Self-pay | Admitting: Neurosurgery

## 2023-11-15 NOTE — Telephone Encounter (Signed)
 Patient came by the office. Dr.Smith wrote her out of work on 09/18/2023 for 2 months. That will run out tomorrow. She is asking for an additional note to keep her out of work. Her last PT appt is on 12/21/2023. Dr.Morales ordered a MRI and CT. She is waiting on the results. We need to fax that letter to [employer] [phone] Case# [id number]

## 2023-11-20 ENCOUNTER — Ambulatory Visit: Admitting: Physical Therapy

## 2023-11-22 ENCOUNTER — Ambulatory Visit: Attending: Neurology | Admitting: Physical Therapy

## 2023-11-22 ENCOUNTER — Encounter: Payer: Self-pay | Admitting: Physical Therapy

## 2023-11-22 ENCOUNTER — Telehealth: Payer: Self-pay | Admitting: Physical Therapy

## 2023-11-22 VITALS — BP 141/91 | HR 98

## 2023-11-22 DIAGNOSIS — M6281 Muscle weakness (generalized): Secondary | ICD-10-CM | POA: Diagnosis present

## 2023-11-22 DIAGNOSIS — M546 Pain in thoracic spine: Secondary | ICD-10-CM | POA: Diagnosis present

## 2023-11-22 DIAGNOSIS — M542 Cervicalgia: Secondary | ICD-10-CM | POA: Insufficient documentation

## 2023-11-22 DIAGNOSIS — G8929 Other chronic pain: Secondary | ICD-10-CM | POA: Diagnosis present

## 2023-11-22 NOTE — Therapy (Signed)
 OUTPATIENT PHYSICAL THERAPY TREATMENT   Patient Name: Tonya Mueller MRN: [id number] DOB:07-22-1966, 58 y.o., female Today's Date: 11/22/2023  END OF SESSION:  PT End of Session - 11/22/23 1935     Visit Number 2    Number of Visits 12    Date for PT Re-Evaluation 01/04/24    Authorization Type [place] [employer] [place] [employer] reporting period from 11/09/2023    Authorization Time Period CC [phone] 3/25-5/15 for 12 PT visits    Authorization - Visit Number 1    Authorization - Number of Visits 12    Progress Note Due on Visit 10    PT Start Time 1819    PT Stop Time 1905    PT Time Calculation (min) 46 min    Activity Tolerance Patient tolerated treatment well;Patient limited by pain    Behavior During Therapy WFL for tasks assessed/performed              Past Medical History:  Diagnosis Date   Allergic rhinitis    Angio-edema    Cervical radiculopathy    Chronic sinusitis    COVID-19    Elevated liver enzymes    Fatty liver disease, nonalcoholic    GERD (gastroesophageal reflux disease)    Peroneal tendonitis, left    Plantar fasciitis    Situational depression    Trigger finger of left  hand    Type 2 diabetes mellitus (HCC)    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    Past Surgical History:  Procedure Laterality Date   ANTERIOR CERVICAL DECOMP/DISCECTOMY FUSION N/A 04/25/2023   Procedure: C6-7 ANTERIOR CERVICAL DISCECTOMY AND FUSION (GLOBUS FORGE);  Surgeon: Lovenia Kim, MD;  Location: [facility];  Service: Neurosurgery;  Laterality: N/A;   BREAST EXCISIONAL BIOPSY Left 2004-2005   benign   CESAREAN SECTION  2006   CHOLECYSTECTOMY     ETHMOIDECTOMY Left 05/24/2022   Procedure: ETHMOIDECTOMY- TOTAL WITH FRONTAL SINUS EXPLORATION;  Surgeon: Geanie Logan, MD;  Location: [facility];  Service: ENT;  Laterality: Left;  Diabetic   GALLBLADDER SURGERY     IMAGE GUIDED SINUS SURGERY Left 05/24/2022   Procedure: IMAGE GUIDED SINUS SURGERY;  Surgeon:  Geanie Logan, MD;  Location: [facility];  Service: ENT;  Laterality: Left;  PLACED DISK ON OR CHARGE NURSE DESK 8-23 KP   MAXILLARY ANTROSTOMY Left 05/24/2022   Procedure: MAXILLARY ANTROSTOMY WITH TISSUE REMOVAL;  Surgeon: Geanie Logan, MD;  Location: [facility];  Service: ENT;  Laterality: Left;   Patient Active Problem List   Diagnosis Date Noted   S/P cervical spinal fusion 04/25/2023   Cervical disc disorder with radiculopathy of cervical region 04/05/2023   Left arm weakness 04/05/2023    PCP: Laren Everts, NP  REFERRING PROVIDER: Ernestine Mcmurray, MD (neurosurgery)   REFERRING DIAG: Post surgical neck pain   THERAPY DIAG:  Cervicalgia  Chronic thoracic spine pain  Muscle weakness (generalized)  Rationale for Evaluation and Treatment: rehabilitation  ONSET DATE: April 24, 2024  SUBJECTIVE:  PERTINENT HISTORY:  Tonya Mueller is a 58yoF s/p C6-7 ACDF referred to OPPT from neurosurgical due to ongoing pain following surgery, inability to resume baseline functional use of BUE for return to work duties. ACDF in September 2024, FU with doctor in November noted radicular pain, has since improved but now pt has neck stiffness, Right posterolateral neck pain, and periscapular pain. EMG showed a chronic but not active radiculopathy Left. MRI showed some improvement in decompression. Pt referred to PT and pain management due to ongoing pain issues and need for conservative management. Dr. Katrinka Blazing has not ruled out facet referral pain as a factor. Patient has been out of work since June 2024.   SUBJECTIVE STATEMENT: Patient states when she turns to her right side at nigh she has a lot of pain on the right. When she does her HEP it makes the right side of her head hurt and  her right ear hurt so she has use heat and ice. She states nothing is new with her health since last PT session.  Marland Kitchen  PAIN:  NPRS: no rating provided but pain at right side of neck, left UE paresthesia (intermittent), and pain radiating to R ear (intermittent).     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 No intravenous contrast was administered.   COMPARISON:  03/09/2023 MRI cervical spine   FINDINGS: Alignment: Straightening and mild reversal of the normal cervical lordosis. No significant listhesis.   Vertebrae: Status post interval ACDF C6-C7. Susceptibility artifact from the hardware limits evaluation of these levels. Within this limitation, no acute fracture, evidence of discitis, or suspicious osseous lesion.   Cord: Normal signal and morphology.   Posterior Fossa, vertebral arteries, paraspinal tissues: Negative.   Disc levels:   C2-C3: No significant disc bulge. No spinal canal stenosis or neuroforaminal narrowing.   C3-C4: Minimal disc bulge. Uncovertebral hypertrophy. Mild spinal canal stenosis, unchanged. No neural foraminal narrowing.   C4-C5: Minimal disc bulge. Uncovertebral hypertrophy. Mild spinal canal stenosis, unchanged. No neural foraminal narrowing.   C5-C6: Minimal disc bulge. Left uncovertebral hypertrophy. No spinal canal stenosis  or neural foraminal narrowing.   C6-C7: Status post interval ACDF. Left uncovertebral hypertrophy. Ligamentum flavum hypertrophy. No residual spinal canal stenosis. Mild left neural foraminal narrowing, improved from the prior exam.   C7-T1: No significant disc bulge. No spinal canal stenosis or neuroforaminal narrowing.   IMPRESSION: 1. Status post interval ACDF C6-C7, with no residual spinal canal stenosis and mild left neural foraminal narrowing, improved from the prior exam. 2. C3-C4 and C4-C5 mild spinal canal stenosis, unchanged.  TREATMENT  Therapeutic exercise: therapeutic exercises that incorporate ONE parameter at one or more areas of the body to centralize symptoms, develop strength and endurance, range of motion, and flexibility required for successful completion of functional activities.  Vitals check for systems review (see above).   Seated cervical retraction with towel roll behind back 1x10 with trial of various positions of neck flexion (neutral to slightly extended does not cause left radicular symptoms).  1x20 in comfortable position.  Worse in left upper arm after Added to HEP  Standing cervical thoracic extension/BUE flexion and serratus anterior activation, lat stretch, with foam roller up wall.  1x20 after learning how to do exercise Initially felt it in the R cervical spine and upper thoracic spine Increased L hand tingling  Neuromuscular Re-education: a technique or exercise performed with the goal of improving the level of communication between the body and the brain, such as for balance, motor control, muscle activation patterns, coordination, desensitization, quality of muscle contraction, proprioception, and/or kinesthetic sense needed for successful and safe completion of functional activities.   Seated scapular retraction with towel roll at  lumbar spine:  1x15 Cuing to decrease scapular elevation and improve scapular retraction.  Discontinued from HEP  Standing wall walks with isometric shoulder ER to improve lower scapular muscle strength/coordination for improved reaching and pushing tasks and to decrease overactivation of upper traps.  1x10 with YTB after learning to perform exercise Fatiguing at lower thoracic spine Increased L hand tingling Added to HEP  Hooklying cervical spine retraction with chin nod 1x10 good form and minimal pain  Hooklying cervical spine rotation AROM Trial with pillow produced R ear pain with R > L rotation No better with head-float device Discontinued from HEP  Standing scapular retraction with band pulled across front of body to improve scapular position 1x15 with double YTB 1x5 with GTB Added to HEP  Manual therapy: to reduce pain and tissue tension, improve range of motion, neuromodulation, in order to promote improved ability to complete functional activities. HOOKLYING STM to posterior cervical spine musculature and SCM, R > L Patient very tender and tight over all right sided muscles, with palpable muscle twitching with slight relaxation with sustained pressure.  Gentle cervical spine distraction had no effect  Flexion rotation test (see above)  Pt required multimodal cuing for proper technique and to facilitate improved neuromuscular control, strength, range of motion, and functional ability resulting in improved performance and form.   PATIENT EDUCATION:  Education details: Exercise purpose/form. Self management techniques. Person educated: Patient Education method: Explanation, Demonstration, Tactile cues, Verbal cues, and Handouts Education comprehension: verbalized understanding, returned demonstration, and needs further education  HOME EXERCISE PROGRAM: Access Code: [email or web address] URL: [email or web address] Date: 11/22/2023 Prepared by: Norton Blizzard  Exercises - Supine Cervical Retraction with Towel  - 3 x daily - 1 sets - 10 reps - 3sec hold - Seated Cervical Retraction  - 1-2 x daily - 3 sets - 10 reps - Forearm Walks on Wall with Resistance Band  - 1 x daily - 2-3 sets - 10 reps - Staggered Stance Scapular Retraction and Depression with Resistance  - 1 x daily - 2-3 sets - 10 reps - 2-5 seconds hold  ASSESSMENT:  CLINICAL IMPRESSION: Patient returns to PT nearly 2 weeks after initial eval and appears to have been doing HEP, although she was having trouble with pain radiating to R ear with supine head rotations. Today's session continued advancing postural strengthening and motor control exercises and assessed patient's soft tissue in  the cervical spine musculature. Patient was very tender and tight over all right sided muscles, with palpable muscle twitching with slight relaxation with sustained pressure. Gentle cervical spine distraction had no effect. She had reproducible right sided ear pain with flexion rotation test (more intense and limited rotating R than to the left) and with end range R>L rotation. She had some discomfort in her mid thoracic spine during some exercises and use of L UE as well as STM at the left side of the neck lead to tolerable paresthesia in the left UE. Patient does have trouble coordinating serratus anterior and lower periscapular muscles to depress and posteriorly tilt the scapulae during UE movements and would benefit from further work in this area. She reported increased pain near the right ear but decreased pain over the right UT and lower cervical spine by end of session. Her family is down to 1 vehicle while the other is being repaired (timeline unsure) which may affect her ability to attend PT regularly.  Patient would benefit from continued management of limiting condition by skilled physical therapist to address remaining impairments and functional limitations to work towards stated goals and return to  PLOF or maximal functional independence.   From initial PT evaluation on 11/09/2023:  58yoF presenting with ongoing neck pain s/p ADCF 6 months prior, is unable to tolerate head movements required for ADL/IADL,driving and has been unable to resume moderate to heavy lifting for return to work. Exam revealing of restricted ROM in cervical spine. Patient will benefit from skilled physical therapy intervention to reduce deficits and impairments identified in evaluation, in order to reduce pain, improve quality of life, and maximize activity tolerance for ADL, IADL, and leisure/fitness. Physical therapy will help pt achieve long and short term goals of care.     OBJECTIVE IMPAIRMENTS: decreased activity tolerance, decreased ROM, and pain.   ACTIVITY LIMITATIONS: carrying, lifting, bending, sitting, bathing, dressing, and reach over head  PARTICIPATION LIMITATIONS: meal prep, cleaning, laundry, interpersonal relationship, driving, shopping, community activity, occupation, and yard work  PERSONAL FACTORS: Age, Fitness, Past/current experiences, and Time since onset of injury/illness/exacerbation are also affecting patient's functional outcome.   REHAB POTENTIAL: Excellent  CLINICAL DECISION MAKING: Evolving/moderate complexity  EVALUATION COMPLEXITY: Moderate   GOALS: Goals reviewed with patient? No  SHORT TERM GOALS: Target date: 12/10/23  Improved cervical rotation ROM by >12 degrees bilat Baseline: ~50 degrees bilat Goal status: In progress  2.  Tolerance of RDL lift ~20lb x20 reps without increased pain >2 points NPRS.  Baseline:  Goal status: In progress  3.  Regular performance of HEP for cervical ROM improvement and BUE loading.  Baseline:  Goal status: In progress  LONG TERM GOALS: Target date: 01/04/24  Pt to demonstrate cervical rotation ROM>70 degrees bilat and combined lumbar+thoracic+cervical rotation >100 degrees bilat Baseline:  Goal status: In progress  2.  Pt to  demonstrate ability to lift 40lb from table height and carry x127ft in preparation for return to work related duties.  Baseline:  Goal status: In progress  3.  Pt to report successful performance of all home-care tasks (activity modifications as needed) without pain exacerbation >1/10.   Baseline:  Goal status: In progress  4.  Pt to report readiness for return to work and to improve NDI by >30%.  Baseline: NDI: 50% on 11/09/23 Goal status: In progress  PLAN:  PT FREQUENCY: 1-2x/week  PT DURATION: 8 weeks  PLANNED INTERVENTIONS: 97110-Therapeutic exercises, 97530- Therapeutic activity, [id number]- Neuromuscular re-education, 97535- Self Care,  40981- Manual therapy, G0283- Electrical stimulation (unattended), [phone]- Electrical stimulation (manual), Patient/Family education, Dry Needling, Spinal mobilization, Scar mobilization, DME instructions, Cryotherapy, and Moist heat  PLAN FOR NEXT SESSION:  Update HEP as appropriate, manual therapy and stretching as tolerated to help decrease soft tissue tension, progressive postural/UE/functional motor control/strengthening/ROM/stretch as appropriate.   Luretha Murphy. Ilsa Iha, PT, DPT 11/22/23, 7:56 PM  [facility] [address] [place], [place] [address] P: [phone] I F: [phone]

## 2023-11-22 NOTE — Telephone Encounter (Signed)
 Called patient when she did not show up for her PT appointment scheduled at 1:45pm. She answered and said she is in the parking lot of the clinic and was late because they only have one care right now and she was not able to get here after her daughter was picking up her grandchild. She was able to reschedule to 6:15pm this evening and stayed to speak to support staff about changing her schedule to be more able to get here given one car for unpredictable amount of time (other one is at the shop).   Tonya Mueller. Ilsa Iha, PT, DPT 11/22/23, 2:08 PM  [facility] [address] [place], [place] [address] P: [phone] I F: [phone]

## 2023-11-23 ENCOUNTER — Ambulatory Visit: Admitting: Physical Therapy

## 2023-11-27 NOTE — Therapy (Unsigned)
 OUTPATIENT PHYSICAL THERAPY TREATMENT   Patient Name: Tonya Mueller MRN: [id number] DOB:02-May-1966, 58 y.o., female Today's Date: 11/28/2023  END OF SESSION:  PT End of Session - 11/28/23 1356     Visit Number 3    Number of Visits 12    Date for PT Re-Evaluation 01/04/24    Authorization Type [place] MEDICAID [place] COMPLETE HEALTH reporting period from 11/09/2023    Authorization Time Period CC [id number] 3/25-5/15 for 12 PT visits    Authorization - Visit Number 2    Authorization - Number of Visits 12    Progress Note Due on Visit 10    PT Start Time 1309    PT Stop Time 1351    PT Time Calculation (min) 42 min    Activity Tolerance Patient tolerated treatment well;Patient limited by pain    Behavior During Therapy WFL for tasks assessed/performed               Past Medical History:  Diagnosis Date   Allergic rhinitis    Angio-edema    Cervical radiculopathy    Chronic sinusitis    COVID-19    Elevated liver enzymes    Fatty liver disease, nonalcoholic    GERD (gastroesophageal reflux disease)    Peroneal tendonitis, left    Plantar fasciitis    Situational depression    Trigger finger of left  hand    Type 2 diabetes mellitus (HCC)    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    Past Surgical History:  Procedure Laterality Date   ANTERIOR CERVICAL DECOMP/DISCECTOMY FUSION N/A 04/25/2023   Procedure: C6-7 ANTERIOR CERVICAL DISCECTOMY AND FUSION (GLOBUS FORGE);  Surgeon: Lovenia Kim, MD;  Location: [facility];  Service: Neurosurgery;  Laterality: N/A;   BREAST EXCISIONAL BIOPSY Left 2004-2005   benign   CESAREAN SECTION  2006   CHOLECYSTECTOMY     ETHMOIDECTOMY Left 05/24/2022   Procedure: ETHMOIDECTOMY- TOTAL WITH FRONTAL SINUS EXPLORATION;  Surgeon: Geanie Logan, MD;  Location: [facility];  Service: ENT;  Laterality: Left;  Diabetic   GALLBLADDER SURGERY     IMAGE GUIDED SINUS SURGERY Left 05/24/2022   Procedure: IMAGE GUIDED SINUS SURGERY;  Surgeon:  Geanie Logan, MD;  Location: [facility];  Service: ENT;  Laterality: Left;  PLACED DISK ON OR CHARGE NURSE DESK 8-23 KP   MAXILLARY ANTROSTOMY Left 05/24/2022   Procedure: MAXILLARY ANTROSTOMY WITH TISSUE REMOVAL;  Surgeon: Geanie Logan, MD;  Location: [facility];  Service: ENT;  Laterality: Left;   Patient Active Problem List   Diagnosis Date Noted   S/P cervical spinal fusion 04/25/2023   Cervical disc disorder with radiculopathy of cervical region 04/05/2023   Left arm weakness 04/05/2023    PCP: Laren Everts, NP  REFERRING PROVIDER: Ernestine Mcmurray, MD (neurosurgery)   REFERRING DIAG: Post surgical neck pain   THERAPY DIAG:  Cervicalgia  Chronic thoracic spine pain  Muscle weakness (generalized)  Rationale for Evaluation and Treatment: rehabilitation  ONSET DATE: April 24, 2024  SUBJECTIVE:  PERTINENT HISTORY:  Tonya Mueller is a 58yoF s/p C6-7 ACDF referred to OPPT from neurosurgical due to ongoing pain following surgery, inability to resume baseline functional use of BUE for return to work duties. ACDF in September 2024, FU with doctor in November noted radicular pain, has since improved but now pt has neck stiffness, Right posterolateral neck pain, and periscapular pain. EMG showed a chronic but not active radiculopathy Left. MRI showed some improvement in decompression. Pt referred to PT and pain management due to ongoing pain issues and need for conservative management. Dr. Katrinka Blazing has not ruled out facet referral pain as a factor. Patient has been out of work since June 2024.   SUBJECTIVE STATEMENT: Patient states she has been doing her HEP but her left arm is worse. She states her entire L UE including all the fingers are numb all the time now, whereas  before it was just digits 1-3. She states she thinks the neck exercise made it worse and she has more pain in the lateral left arm. She states she also started having more pain at the left low back down to the left buttocks.  Marland Kitchen  PAIN:  NPRS: 4/10 pain at right side of neck, left UE paresthesia (constant), and pain radiating to R ear (intermittent with head turn).     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  Seated scapular retraction with towel roll at lumbar spine:  1x2 Increased L UE pain/paresthesia Confirmed discontinuation from HEP  Standing scapular retraction with band pulled across front of body to improve scapular position 1x5 with YTB Discontinued due to increased L UE pain/paresthesia Discontinued from HEP  . Standing B shoulder ER with scapular retraction with TB 1x20 with YTB Increased L arm pain with last 3 reps 2x17 with YTB Better tolerated Added to HEP  Attempted supine scapular protraction but unable to hold 6#DB with elbow extended due to weakness. Discontinued  MMT L elbow (see above)  Prone on elbows scapular protraction 1x10 2x15 Increased tingling in B hands but no increased pain Added to HEP  Manual therapy: to reduce pain and tissue tension, improve range of motion, neuromodulation, in order to  promote improved ability to complete functional activities. HOOKLYING STM to posterior cervical spine musculature and SCM, R > L Focus on suboccipitals and right perispinal muscles with sustained pressure  which radiated in concordant pattern to R ear and head.   Attempted suboccipital contract-relax MET but caused pain down L UE so discontinued  Improved R sided neck pain with rotation following manual.   Pt required multimodal cuing for proper technique and to facilitate improved neuromuscular control, strength, range of motion, and functional ability resulting in improved performance and form.   PATIENT EDUCATION:  Education details: Exercise purpose/form. Self management techniques. Person educated: Patient Education method: Explanation, Demonstration, Tactile cues, Verbal cues, and Handouts Education comprehension: verbalized understanding, returned demonstration, and needs further education  HOME EXERCISE PROGRAM: Access Code: [email or web address] URL: [email or web address] Date: 11/28/2023 Prepared by: Norton Blizzard  Exercises - Supine Cervical Retraction with Towel  - 3 x daily - 1 sets - 10 reps - 3sec hold - Forearm Walks on Wall with Resistance Band  - 1 x daily - 2-3 sets - 10 reps - Shoulder External Rotation and Scapular Retraction with Resistance  - 1 x daily - 3 sets - 15 reps - Prone Scapular Protraction Retraction AROM on Forearms  - 1 x daily - 3 sets - 15 reps  ASSESSMENT:  CLINICAL IMPRESSION: Patient with continued L radicular symptoms that stopped patient from doing cervical retraction or flexion exercises. Also with neurologic strength deficit in L C7 myotome (elbow extension) that prevented her from doing supine scapular protraction exercise, but she was able to perform protraction in prone on elbows to help strengthen serratus anterior. She felt significant improvement in right sided neck pain after manual therapy, where she had many active trigger points  with softening with sustained pressure.Patient would benefit from continued management of limiting condition by skilled physical therapist to address remaining impairments and functional limitations to work towards stated goals and return to PLOF or maximal functional independence.    From initial PT evaluation on 11/09/2023:  58yoF presenting with ongoing neck pain s/p ADCF 6 months prior, is unable to tolerate head movements required for ADL/IADL,driving and has been unable to resume moderate to heavy lifting for return to work. Exam revealing of restricted ROM in cervical spine. Patient will benefit from skilled physical therapy intervention to reduce deficits and impairments identified in evaluation, in order to reduce pain, improve quality of life, and maximize activity tolerance for ADL, IADL, and leisure/fitness. Physical therapy will help pt achieve long and short term goals of care.     OBJECTIVE IMPAIRMENTS: decreased activity tolerance, decreased ROM, and pain.   ACTIVITY LIMITATIONS: carrying, lifting, bending, sitting, bathing, dressing, and reach over head  PARTICIPATION LIMITATIONS: meal prep, cleaning, laundry, interpersonal relationship, driving, shopping, community activity, occupation, and yard work  PERSONAL FACTORS: Age, Fitness, Past/current experiences, and Time since onset of injury/illness/exacerbation are also affecting patient's functional outcome.   REHAB POTENTIAL: Excellent  CLINICAL DECISION MAKING: Evolving/moderate complexity  EVALUATION COMPLEXITY: Moderate   GOALS: Goals reviewed with patient? No  SHORT TERM GOALS: Target date: 12/10/23  Improved cervical rotation ROM by >12 degrees bilat Baseline: ~50 degrees bilat Goal status: In progress  2.  Tolerance of RDL lift ~20lb x20 reps without increased pain >2 points NPRS.  Baseline:  Goal status: In progress  3.  Regular performance of HEP for cervical ROM improvement and BUE loading.  Baseline:   Goal status: In progress  LONG TERM GOALS: Target date: 01/04/24  Pt to demonstrate cervical rotation ROM>70 degrees bilat and combined lumbar+thoracic+cervical rotation >100 degrees bilat Baseline:  Goal status: In progress  2.  Pt to demonstrate ability to lift 40lb from  table height and carry x164ft in preparation for return to work related duties.  Baseline:  Goal status: In progress  3.  Pt to report successful performance of all home-care tasks (activity modifications as needed) without pain exacerbation >1/10.   Baseline:  Goal status: In progress  4.  Pt to report readiness for return to work and to improve NDI by >30%.  Baseline: NDI: 50% on 11/09/23 Goal status: In progress  PLAN:  PT FREQUENCY: 1-2x/week  PT DURATION: 8 weeks  PLANNED INTERVENTIONS: 97110-Therapeutic exercises, 97530- Therapeutic activity, [id number]- Neuromuscular re-education, 97535- Self Care, 14782- Manual therapy, G0283- Electrical stimulation (unattended), [phone]- Electrical stimulation (manual), Patient/Family education, Dry Needling, Spinal mobilization, Scar mobilization, DME instructions, Cryotherapy, and Moist heat  PLAN FOR NEXT SESSION:  Update HEP as appropriate, manual therapy and stretching as tolerated to help decrease soft tissue tension, progressive postural/UE/functional motor control/strengthening/ROM/stretch as appropriate.   Luretha Murphy. Ilsa Iha, PT, DPT 11/28/23, 2:11 PM  [facility] [address] [place], [place] [address] P: [phone] I F: [phone]

## 2023-11-28 ENCOUNTER — Ambulatory Visit: Admitting: Physical Therapy

## 2023-11-28 ENCOUNTER — Encounter: Payer: Self-pay | Admitting: Physical Therapy

## 2023-11-28 DIAGNOSIS — M542 Cervicalgia: Secondary | ICD-10-CM

## 2023-11-28 DIAGNOSIS — G8929 Other chronic pain: Secondary | ICD-10-CM

## 2023-11-28 DIAGNOSIS — M6281 Muscle weakness (generalized): Secondary | ICD-10-CM

## 2023-11-30 ENCOUNTER — Ambulatory Visit: Admitting: Physical Therapy

## 2023-11-30 ENCOUNTER — Encounter: Payer: Self-pay | Admitting: Physical Therapy

## 2023-11-30 DIAGNOSIS — M542 Cervicalgia: Secondary | ICD-10-CM | POA: Diagnosis not present

## 2023-11-30 DIAGNOSIS — G8929 Other chronic pain: Secondary | ICD-10-CM

## 2023-11-30 DIAGNOSIS — M6281 Muscle weakness (generalized): Secondary | ICD-10-CM

## 2023-11-30 NOTE — Therapy (Signed)
 OUTPATIENT PHYSICAL THERAPY TREATMENT   Patient Name: Tonya Mueller MRN: [id number] DOB:May 09, 1966, 58 y.o., female Today's Date: 11/30/2023  END OF SESSION:  PT End of Session - 11/30/23 2017     Visit Number 4    Number of Visits 12    Date for PT Re-Evaluation 01/04/24    Authorization Type [place] MEDICAID [place] COMPLETE HEALTH reporting period from 11/09/2023    Authorization Time Period CC [phone] 3/25-5/15 for 12 PT visits    Authorization - Visit Number 3    Authorization - Number of Visits 12    Progress Note Due on Visit 10    PT Start Time 1753    PT Stop Time 1816    PT Time Calculation (min) 23 min    Activity Tolerance Patient tolerated treatment well;Patient limited by pain    Behavior During Therapy WFL for tasks assessed/performed                Past Medical History:  Diagnosis Date   Allergic rhinitis    Angio-edema    Cervical radiculopathy    Chronic sinusitis    COVID-19    Elevated liver enzymes    Fatty liver disease, nonalcoholic    GERD (gastroesophageal reflux disease)    Peroneal tendonitis, left    Plantar fasciitis    Situational depression    Trigger finger of left  hand    Type 2 diabetes mellitus (HCC)    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    Past Surgical History:  Procedure Laterality Date   ANTERIOR CERVICAL DECOMP/DISCECTOMY FUSION N/A 04/25/2023   Procedure: C6-7 ANTERIOR CERVICAL DISCECTOMY AND FUSION (GLOBUS FORGE);  Surgeon: Lovenia Kim, MD;  Location: [facility];  Service: Neurosurgery;  Laterality: N/A;   BREAST EXCISIONAL BIOPSY Left 2004-2005   benign   CESAREAN SECTION  2006   CHOLECYSTECTOMY     ETHMOIDECTOMY Left 05/24/2022   Procedure: ETHMOIDECTOMY- TOTAL WITH FRONTAL SINUS EXPLORATION;  Surgeon: Geanie Logan, MD;  Location: [facility];  Service: ENT;  Laterality: Left;  Diabetic   GALLBLADDER SURGERY     IMAGE GUIDED SINUS SURGERY Left 05/24/2022   Procedure: IMAGE GUIDED SINUS SURGERY;   Surgeon: Geanie Logan, MD;  Location: [facility];  Service: ENT;  Laterality: Left;  PLACED DISK ON OR CHARGE NURSE DESK 8-23 KP   MAXILLARY ANTROSTOMY Left 05/24/2022   Procedure: MAXILLARY ANTROSTOMY WITH TISSUE REMOVAL;  Surgeon: Geanie Logan, MD;  Location: [facility];  Service: ENT;  Laterality: Left;   Patient Active Problem List   Diagnosis Date Noted   S/P cervical spinal fusion 04/25/2023   Cervical disc disorder with radiculopathy of cervical region 04/05/2023   Left arm weakness 04/05/2023    PCP: Laren Everts, NP  REFERRING PROVIDER: Ernestine Mcmurray, MD (neurosurgery)   REFERRING DIAG: Post surgical neck pain   THERAPY DIAG:  Cervicalgia  Chronic thoracic spine pain  Muscle weakness (generalized)  Rationale for Evaluation and Treatment: rehabilitation  ONSET DATE: April 24, 2024  SUBJECTIVE:  PERTINENT HISTORY:  Tonya Mueller is a 58yoF s/p C6-7 ACDF referred to OPPT from neurosurgical due to ongoing pain following surgery, inability to resume baseline functional use of BUE for return to work duties. ACDF in September 2024, FU with doctor in November noted radicular pain, has since improved but now pt has neck stiffness, Right posterolateral neck pain, and periscapular pain. EMG showed a chronic but not active radiculopathy Left. MRI showed some improvement in decompression. Pt referred to PT and pain management due to ongoing pain issues and need for conservative management. Dr. Katrinka Blazing has not ruled out facet referral pain as a factor. Patient has been out of work since June 2024.   SUBJECTIVE STATEMENT: Patient states she felt better for 2 hours after last PT session. Then her pain returned. She got paine in the left upper arm when doing the exercises.  She was severely late due to traffic in the  rain today. She states when she does her HEP it increases pain. She would like to repeat manual therapy intervention and continue her HEP at home.  Marland Kitchen  PAIN:  NPRS: 4-5/10 pain at right side of neck when she turns to the right, left UE paresthesia (constant), and pain radiating to R ear (intermittent with head turn 1/10).     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 HOOKLYING STM to posterior cervical spine musculature and SCM, R > L Focus on R suboccipitals, paraspinal muscles, and upper trap with sustained pressure which radiated in concordant pattern to R ear and head.  Improved head AROM with report of pain resolution following manual therapy.   Therapeutic exercise: therapeutic exercises that incorporate ONE parameter at one or more areas of the body to centralize symptoms, develop strength and endurance, range of motion, and flexibility required for successful completion of functional activities.  Cervical spine AROM check following manual therapy: Improved significantly with report of pain resolution  Educated on importance of moving neck through available AROM frequently throughout the day to help maintain or encourage decreased muscle tension. Educated on importance of trying to stay relaxed when moving neck and not forcing it to move through intense pain that causes guarding.   Pt required multimodal cuing for proper technique and to facilitate improved  neuromuscular control, strength, range of motion, and functional ability resulting in improved performance and form.   PATIENT EDUCATION:  Education details: Exercise purpose/form. Self management techniques. Person educated: Patient Education method: Explanation, Demonstration, Tactile cues, Verbal cues, and Handouts  Education comprehension: verbalized understanding, returned demonstration, and needs further education  HOME EXERCISE PROGRAM: Access Code: [email or web address] URL: [email or web address] Date: 11/28/2023 Prepared by: Norton Blizzard  Exercises - Supine Cervical Retraction with Towel  - 3 x daily - 1 sets - 10 reps - 3sec hold - Forearm Walks on Wall with Resistance Band  - 1 x daily - 2-3 sets - 10 reps - Shoulder External Rotation and Scapular Retraction with Resistance  - 1 x daily - 3 sets - 15 reps - Prone Scapular Protraction Retraction AROM on Forearms  - 1 x daily - 3 sets - 15 reps  ASSESSMENT:  CLINICAL IMPRESSION: Patient with continued L radicular symptoms that stopped patient from doing cervical retraction or flexion exercises. Also with neurologic strength deficit in L C7 myotome (elbow extension) that prevented her from doing supine scapular protraction exercise, but she was able to perform protraction in prone on elbows to help strengthen serratus anterior. She felt significant improvement in right sided neck pain after manual therapy, where she had many active trigger points with softening with sustained pressure.Patient would benefit from continued management of limiting condition by skilled physical therapist to address remaining impairments and functional limitations to work towards stated goals and return to PLOF or maximal functional independence.   From initial PT evaluation on 11/09/2023:  58yoF presenting with ongoing neck pain s/p ADCF 6 months prior, is unable to tolerate head movements required for ADL/IADL,driving and has been unable to resume  moderate to heavy lifting for return to work. Exam revealing of restricted ROM in cervical spine. Patient will benefit from skilled physical therapy intervention to reduce deficits and impairments identified in evaluation, in order to reduce pain, improve quality of life, and maximize activity tolerance for ADL, IADL, and leisure/fitness. Physical therapy will help pt achieve long and short term goals of care.     OBJECTIVE IMPAIRMENTS: decreased activity tolerance, decreased ROM, and pain.   ACTIVITY LIMITATIONS: carrying, lifting, bending, sitting, bathing, dressing, and reach over head  PARTICIPATION LIMITATIONS: meal prep, cleaning, laundry, interpersonal relationship, driving, shopping, community activity, occupation, and yard work  PERSONAL FACTORS: Age, Fitness, Past/current experiences, and Time since onset of injury/illness/exacerbation are also affecting patient's functional outcome.   REHAB POTENTIAL: Excellent  CLINICAL DECISION MAKING: Evolving/moderate complexity  EVALUATION COMPLEXITY: Moderate   GOALS: Goals reviewed with patient? No  SHORT TERM GOALS: Target date: 12/10/23  Improved cervical rotation ROM by >12 degrees bilat Baseline: ~50 degrees bilat Goal status: In progress  2.  Tolerance of RDL lift ~20lb x20 reps without increased pain >2 points NPRS.  Baseline:  Goal status: In progress  3.  Regular performance of HEP for cervical ROM improvement and BUE loading.  Baseline:  Goal status: In progress  LONG TERM GOALS: Target date: 01/04/24  Pt to demonstrate cervical rotation ROM>70 degrees bilat and combined lumbar+thoracic+cervical rotation >100 degrees bilat Baseline:  Goal status: In progress  2.  Pt to demonstrate ability to lift 40lb from table height and carry x170ft in preparation for return to work related duties.  Baseline:  Goal status: In progress  3.  Pt to report successful performance of all home-care tasks (activity modifications as  needed) without pain exacerbation >1/10.   Baseline:  Goal status: In progress  4.  Pt to report readiness for return to work and to improve NDI by >30%.  Baseline: NDI: 50% on 11/09/23 Goal status: In progress  PLAN:  PT FREQUENCY: 1-2x/week  PT DURATION: 8 weeks  PLANNED  INTERVENTIONS: 97110-Therapeutic exercises, 97530- Therapeutic activity, [id number]- Neuromuscular re-education, [phone]- Self Care, 60454- Manual therapy, G0283- Electrical stimulation (unattended), [phone]- Electrical stimulation (manual), Patient/Family education, Dry Needling, Spinal mobilization, Scar mobilization, DME instructions, Cryotherapy, and Moist heat  PLAN FOR NEXT SESSION:  Update HEP as appropriate, manual therapy and stretching as tolerated to help decrease soft tissue tension, progressive postural/UE/functional motor control/strengthening/ROM/stretch as appropriate.   Luretha Murphy. Ilsa Iha, PT, DPT 11/30/23, 8:23 PM  [facility] [address] [place], [place] [address] P: [phone] I F: [phone]

## 2023-12-05 ENCOUNTER — Encounter: Payer: Self-pay | Admitting: Physical Therapy

## 2023-12-05 ENCOUNTER — Ambulatory Visit: Admitting: Physical Therapy

## 2023-12-05 DIAGNOSIS — M542 Cervicalgia: Secondary | ICD-10-CM

## 2023-12-05 DIAGNOSIS — M6281 Muscle weakness (generalized): Secondary | ICD-10-CM

## 2023-12-05 DIAGNOSIS — G8929 Other chronic pain: Secondary | ICD-10-CM

## 2023-12-05 NOTE — Therapy (Signed)
 OUTPATIENT PHYSICAL THERAPY TREATMENT   Patient Name: Tonya Mueller MRN: [id number] DOB:[birth year], 58 y.o., female Today's Date: 12/05/2023  END OF SESSION:  PT End of Session - 12/05/23 1330     Visit Number 5    Number of Visits 12    Date for PT Re-Evaluation 01/04/24    Authorization Type [place] MEDICAID [place] COMPLETE HEALTH reporting period from 11/09/2023    Authorization Time Period CC [id number]#[id number] 3/25-5/15 for 12 PT visits    Authorization - Visit Number 4    Authorization - Number of Visits 12    Progress Note Due on Visit 10    PT Start Time 1303    PT Stop Time 1349    PT Time Calculation (min) 46 min    Activity Tolerance Patient tolerated treatment well;Patient limited by pain    Behavior During Therapy WFL for tasks assessed/performed                 Past Medical History:  Diagnosis Date   Allergic rhinitis    Angio-edema    Cervical radiculopathy    Chronic sinusitis    COVID-19    Elevated liver enzymes    Fatty liver disease, nonalcoholic    GERD (gastroesophageal reflux disease)    Peroneal tendonitis, left    Plantar fasciitis    Situational depression    Trigger finger of left  hand    Type 2 diabetes mellitus (HCC)    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    Past Surgical History:  Procedure Laterality Date   ANTERIOR CERVICAL DECOMP/DISCECTOMY FUSION N/A 04/25/2023   Procedure: C6-7 ANTERIOR CERVICAL DISCECTOMY AND FUSION (GLOBUS FORGE);  Surgeon: Carroll Clamp, MD;  Location: [facility]S;  Service: Neurosurgery;  Laterality: N/A;   BREAST EXCISIONAL BIOPSY Left 2004-2005   benign   CESAREAN SECTION  2006   CHOLECYSTECTOMY     ETHMOIDECTOMY Left 05/24/2022   Procedure: ETHMOIDECTOMY- TOTAL WITH FRONTAL SINUS EXPLORATION;  Surgeon: Von Grumbling, MD;  Location: [facility];  Service: ENT;  Laterality: Left;  Diabetic   GALLBLADDER SURGERY     IMAGE GUIDED SINUS SURGERY Left 05/24/2022   Procedure: IMAGE GUIDED SINUS SURGERY;   Surgeon: Von Grumbling, MD;  Location: [facility];  Service: ENT;  Laterality: Left;  PLACED DISK ON OR CHARGE NURSE DESK 8-23 KP   MAXILLARY ANTROSTOMY Left 05/24/2022   Procedure: MAXILLARY ANTROSTOMY WITH TISSUE REMOVAL;  Surgeon: Von Grumbling, MD;  Location: [facility];  Service: ENT;  Laterality: Left;   Patient Active Problem List   Diagnosis Date Noted   S/P cervical spinal fusion 04/25/2023   Cervical disc disorder with radiculopathy of cervical region 04/05/2023   Left arm weakness 04/05/2023    PCP: Naaman Au, NP  REFERRING PROVIDER: Henderson Lock, MD (neurosurgery)   REFERRING DIAG: Post surgical neck pain   THERAPY DIAG:  Cervicalgia  Chronic thoracic spine pain  Muscle weakness (generalized)  Rationale for Evaluation and Treatment: rehabilitation  ONSET DATE: April 24, 2024  SUBJECTIVE:  PERTINENT HISTORY:  Saranya Harlin is a 58yoF s/p C6-7 ACDF referred to OPPT from neurosurgical due to ongoing pain following surgery, inability to resume baseline functional use of BUE for return to work duties. ACDF in September 2024, FU with doctor in November noted radicular pain, has since improved but now pt has neck stiffness, Right posterolateral neck pain, and periscapular pain. EMG showed a chronic but not active radiculopathy Left. MRI showed some improvement in decompression. Pt referred to PT and pain management due to ongoing pain issues and need for conservative management. Dr. Katrinka Blazing has not ruled out facet referral pain as a factor. Patient has been out of work since June 2024.   Exercise history: was active before. Has an elliptical machine at home that she used to use regularly.   SUBJECTIVE STATEMENT: Patient states states she felt better after  last PT session but she has started to have pain on the left trunk from the scapula down to the top of the glute. She feels it especially when she does the seated B shoulder ER with scapular retraction. She feels the pain when she is doing scapular protraction on prone press up.  Marland Kitchen  PAIN:  NPRS: 3/10 pain at right side of neck when she turns to the right, left UE paresthesia (constant), and pain radiating to R ear (intermittent with head turn).     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, intermittent push/pull loaded carts  PATIENT GOALS: Be able to tolerate return to work level activity    OBJECTIVE:   TREATMENT                                                                                                                     Neuromuscular Re-education: a technique or exercise performed with the goal of improving the level of communication between the body and the brain, such as for balance, motor control, muscle activation patterns, coordination, desensitization, quality of muscle contraction, proprioception, and/or kinesthetic sense needed for successful and safe completion of functional activities.   Seated rows on OMEGA machine with chest supported, sitting on dynadisc to increase seat height.  2x15 at 15# 1x20 at 15# Cuing for correct scapular position Shaking at end range Reports she needs to use R UE more than left Improved form with cuing and practice  Standing scapular protraction with on elbows on wall 3x20  Cuing for correct scapular position Updated HEP with education and handout (added this exercise and removed prone version)  Therapeutic exercise: therapeutic exercises that incorporate ONE parameter at one or more areas of the body to centralize symptoms, develop strength and endurance, range of motion, and flexibility required for successful completion of functional activities.  NuStep  using bilateral upper and lower extremities. For improved extremity mobility, muscular endurance, and activity tolerance; and to induce the analgesic effect of aerobic exercise, stimulate improved joint nutrition, and prepare body structures and systems for following interventions. Also to  reinforce understanding of appropriate exercise intensity to help meet physical activity guidelines for health.  Seat/handle setting: 6/8 6:24  minutes Level: 4 Target SPM: > 100 Average SPM: 97 RPE: 5-7/10 Educated on role of aerobic exercise for health and recovery and encouraged patient to try using elliptical for 5 min at home (she has not used since onset of neck pain over a year ago).   Manual therapy: to reduce pain and tissue tension, improve range of motion, neuromodulation, in order to promote improved ability to complete functional activities. HOOKLYING STM to posterior cervical spine musculature and SCM, R > L Focus on R suboccipitals, paraspinal muscles, and upper trap with sustained pressure which radiated in concordant pattern to R ear and head. Also had increased paresthesia down L UE when performing STM at R mid-cervical paraspinal muscles.   Improved head AROM with report of pain resolution following manual therapy.   Pt required multimodal cuing for proper technique and to facilitate improved neuromuscular control, strength, range of motion, and functional ability resulting in improved performance and form.   PATIENT EDUCATION:  Education details: Exercise purpose/form. Self management techniques. Person educated: Patient Education method: Explanation, Demonstration, Tactile cues, Verbal cues, and Handouts Education comprehension: verbalized understanding, returned demonstration, and needs further education  HOME EXERCISE PROGRAM: Access Code: [email or web address] URL: [email or web address] Date: 12/05/2023 Prepared by: Alleen Isle  Exercises - Supine Cervical Retraction with  Towel  - 3 x daily - 1 sets - 10 reps - 3sec hold - Forearm Walks on Wall with Resistance Band  - 1 x daily - 2-3 sets - 10 reps - Shoulder External Rotation and Scapular Retraction with Resistance  - 1 x daily - 3 sets - 15 reps - Scapular Protraction at Wall  - 1 x daily - 2-3 sets - 20 reps  ASSESSMENT:  CLINICAL IMPRESSION: Patient continued with shoulder girdle motor control and strengthening to improve activity tolerance and support long term recovery. She continued to have increased L UE paresthesia with execise and her left UE contributed less to pulling exercises than right. She needed a lot of cuing for correct scapular position. She again responded well to manual therapy with very tender trigger points along the right side of the neck with improved pain and motion following. HEP was updated to avoid prone on elbows position, which may have been aggravating her low back. Patient would benefit from continued management of limiting condition by skilled physical therapist to address remaining impairments and functional limitations to work towards stated goals and return to PLOF or maximal functional independence.   From initial PT evaluation on 11/09/2023:  58yoF presenting with ongoing neck pain s/p ADCF 6 months prior, is unable to tolerate head movements required for ADL/IADL,driving and has been unable to resume moderate to heavy lifting for return to work. Exam revealing of restricted ROM in cervical spine. Patient will benefit from skilled physical therapy intervention to reduce deficits and impairments identified in evaluation, in order to reduce pain, improve quality of life, and maximize activity tolerance for ADL, IADL, and leisure/fitness. Physical therapy will help pt achieve long and short term goals of care.     OBJECTIVE IMPAIRMENTS: decreased activity tolerance, decreased ROM, and pain.   ACTIVITY LIMITATIONS: carrying, lifting, bending, sitting, bathing, dressing, and reach  over head  PARTICIPATION LIMITATIONS: meal prep, cleaning, laundry, interpersonal relationship, driving, shopping, community activity, occupation, and yard work  PERSONAL FACTORS: Age, Fitness, Past/current experiences, and Time since onset of injury/illness/exacerbation are also affecting patient's functional  outcome.   REHAB POTENTIAL: Excellent  CLINICAL DECISION MAKING: Evolving/moderate complexity  EVALUATION COMPLEXITY: Moderate   GOALS: Goals reviewed with patient? No  SHORT TERM GOALS: Target date: 12/10/23  Improved cervical rotation ROM by >12 degrees bilat Baseline: ~50 degrees bilat Goal status: In progress  2.  Tolerance of RDL lift ~20lb x20 reps without increased pain >2 points NPRS.  Baseline:  Goal status: In progress  3.  Regular performance of HEP for cervical ROM improvement and BUE loading.  Baseline:  Goal status: In progress  LONG TERM GOALS: Target date: 01/04/24  Pt to demonstrate cervical rotation ROM>70 degrees bilat and combined lumbar+thoracic+cervical rotation >100 degrees bilat Baseline:  Goal status: In progress  2.  Pt to demonstrate ability to lift 40lb from table height and carry x138ft in preparation for return to work related duties.  Baseline:  Goal status: In progress  3.  Pt to report successful performance of all home-care tasks (activity modifications as needed) without pain exacerbation >1/10.   Baseline:  Goal status: In progress  4.  Pt to report readiness for return to work and to improve NDI by >30%.  Baseline: NDI: 50% on 11/09/23 Goal status: In progress  PLAN:  PT FREQUENCY: 1-2x/week  PT DURATION: 8 weeks  PLANNED INTERVENTIONS: 97110-Therapeutic exercises, 97530- Therapeutic activity, [id number]- Neuromuscular re-education, 97535- Self Care, 11914- Manual therapy, G0283- Electrical stimulation (unattended), [phone]- Electrical stimulation (manual), Patient/Family education, Dry Needling, Spinal mobilization, Scar  mobilization, DME instructions, Cryotherapy, and Moist heat  PLAN FOR NEXT SESSION:  Update HEP as appropriate, manual therapy and stretching as tolerated to help decrease soft tissue tension, progressive postural/UE/functional motor control/strengthening/ROM/stretch as appropriate.   Carilyn Charles. Artemio Larry, PT, DPT 12/05/23, 1:58 PM  [facility] [address] [place], [place] [address] P: [phone] I F: [phone]

## 2023-12-07 ENCOUNTER — Encounter: Payer: Self-pay | Admitting: Physical Therapy

## 2023-12-07 ENCOUNTER — Ambulatory Visit: Admitting: Physical Therapy

## 2023-12-07 DIAGNOSIS — M6281 Muscle weakness (generalized): Secondary | ICD-10-CM

## 2023-12-07 DIAGNOSIS — G8929 Other chronic pain: Secondary | ICD-10-CM

## 2023-12-07 DIAGNOSIS — M542 Cervicalgia: Secondary | ICD-10-CM

## 2023-12-07 NOTE — Therapy (Signed)
 OUTPATIENT PHYSICAL THERAPY TREATMENT   Patient Name: Tonya Mueller MRN: [id number] DOB:1965-09-03, 58 y.o., female Today's Date: 12/07/2023  END OF SESSION:  PT End of Session - 12/07/23 0954     Visit Number 6    Number of Visits 12    Date for PT Re-Evaluation 01/04/24    Authorization Type [place] MEDICAID [place] COMPLETE HEALTH reporting period from 11/09/2023    Authorization Time Period CC [id number]#[id number] 3/25-5/15 for 12 PT visits    Authorization - Visit Number 5    Authorization - Number of Visits 12    Progress Note Due on Visit 10    PT Start Time 0951    PT Stop Time 1029    PT Time Calculation (min) 38 min    Activity Tolerance Patient tolerated treatment well;Patient limited by pain    Behavior During Therapy [facility] for tasks assessed/performed                  Past Medical History:  Diagnosis Date   Allergic rhinitis    Angio-edema    Cervical radiculopathy    Chronic sinusitis    COVID-19    Elevated liver enzymes    Fatty liver disease, nonalcoholic    GERD (gastroesophageal reflux disease)    Peroneal tendonitis, left    Plantar fasciitis    Situational depression    Trigger finger of left  hand    Type 2 diabetes mellitus (HCC)    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    Past Surgical History:  Procedure Laterality Date   ANTERIOR CERVICAL DECOMP/DISCECTOMY FUSION N/A 04/25/2023   Procedure: C6-7 ANTERIOR CERVICAL DISCECTOMY AND FUSION (GLOBUS FORGE);  Surgeon: Carroll Clamp, MD;  Location: [facility];  Service: Neurosurgery;  Laterality: N/A;   BREAST EXCISIONAL BIOPSY Left 2004-2005   benign   CESAREAN SECTION  2006   CHOLECYSTECTOMY     ETHMOIDECTOMY Left 05/24/2022   Procedure: ETHMOIDECTOMY- TOTAL WITH FRONTAL SINUS EXPLORATION;  Surgeon: Von Grumbling, MD;  Location: [facility];  Service: ENT;  Laterality: Left;  Diabetic   GALLBLADDER SURGERY     IMAGE GUIDED SINUS SURGERY Left 05/24/2022   Procedure: IMAGE GUIDED SINUS SURGERY;   Surgeon: Von Grumbling, MD;  Location: [facility];  Service: ENT;  Laterality: Left;  PLACED DISK ON OR CHARGE NURSE DESK 8-23 KP   MAXILLARY ANTROSTOMY Left 05/24/2022   Procedure: MAXILLARY ANTROSTOMY WITH TISSUE REMOVAL;  Surgeon: Von Grumbling, MD;  Location: [facility];  Service: ENT;  Laterality: Left;   Patient Active Problem List   Diagnosis Date Noted   S/P cervical spinal fusion 04/25/2023   Cervical disc disorder with radiculopathy of cervical region 04/05/2023   Left arm weakness 04/05/2023    PCP: Naaman Au, NP  REFERRING PROVIDER: Henderson Lock, MD (neurosurgery)   REFERRING DIAG: Post surgical neck pain   THERAPY DIAG:  Cervicalgia  Chronic thoracic spine pain  Muscle weakness (generalized)  Rationale for Evaluation and Treatment: rehabilitation  ONSET DATE: April 24, 2024  SUBJECTIVE:  PERTINENT HISTORY:  Tonya Mueller is a 58yoF s/p C6-7 ACDF referred to OPPT from neurosurgical due to ongoing pain following surgery, inability to resume baseline functional use of BUE for return to work duties. ACDF in September 2024, FU with doctor in November noted radicular pain, has since improved but now pt has neck stiffness, Right posterolateral neck pain, and periscapular pain. EMG showed a chronic but not active radiculopathy Left. MRI showed some improvement in decompression. Pt referred to PT and pain management due to ongoing pain issues and need for conservative management. Dr. Felipe Horton has not ruled out facet referral pain as a factor. Patient has been out of work since June 2024.   Exercise history: was active before. Has an elliptical machine at home that she used to use regularly.   SUBJECTIVE STATEMENT: Patient states she is feeling a  little  better today. It took 3-4 hours for the left UE tingling to go back to baseline after last PT session. She has been doing her HEP 3 times a day and has no questions about it. The pain in her lower back is a little bit better. She has not tried her elliptical machine at home yet.  Aaron Aas  PAIN:  NPRS: 3/10 pain at right side of neck and sharp to ear when she turns to the right, left UE paresthesia (constant) with pain at lateral arm when she does something.     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, intermittent push/pull loaded carts  PATIENT GOALS: Be able to tolerate return to work level activity    OBJECTIVE:   TREATMENT                                                                                                                     Neuromuscular Re-education: a technique or exercise performed with the goal of improving the level of communication between the body and the brain, such as for balance, motor control, muscle activation patterns, coordination, desensitization, quality of muscle contraction, proprioception, and/or kinesthetic sense needed for successful and safe completion of functional activities.   Superset:  Seated rows on OMEGA machine with chest supported, sitting on dynadisc to increase seat height.  2x15 at 15# 1x20 at 15# Cuing for correct scapular position Shaking at end range Reports she needs to use R UE more than left Improved form with cuing and practice  Seated chest press with scapular protraction on OMEGA machine, sitting on dynadisc to increase seat height 3x15 at 5# Cuing for correct scapular position and to avoid shrugging MMT L elbow extension from 4-/5 to 3-/5 before and after first. Neurologic with uneven muscular contraction.  Reports feeling shaking and weakness by rep 10   Standing scapular protraction with on elbows on wall 3x20  Cuing for correct scapular  position Updated HEP with education and handout (added this exercise and removed prone version)  Therapeutic exercise: therapeutic exercises that incorporate ONE parameter at one or  more areas of the body to centralize symptoms, develop strength and endurance, range of motion, and flexibility required for successful completion of functional activities.  NuStep using bilateral upper and lower extremities. For improved extremity mobility, muscular endurance, and activity tolerance; and to induce the analgesic effect of aerobic exercise, stimulate improved joint nutrition, and prepare body structures and systems for following interventions. Also to reinforce understanding of appropriate exercise intensity to help meet physical activity guidelines for health.  Seat/handle setting: 6/8 5  minutes Level: 4-5 Target SPM: > 100 Average SPM: 99 RPE: 4-6/10  Manual therapy: to reduce pain and tissue tension, improve range of motion, neuromodulation, in order to promote improved ability to complete functional activities. HOOKLYING STM to posterior cervical spine musculature and SCM, R > L Focus on R suboccipitals, paraspinal muscles, and upper trap with sustained pressure which radiated in concordant pattern to R ear and head. Also had increased paresthesia down L UE when performing STM at R mid-cervical paraspinal muscles.  Education about dry needling.   Pt required multimodal cuing for proper technique and to facilitate improved neuromuscular control, strength, range of motion, and functional ability resulting in improved performance and form.   PATIENT EDUCATION:  Education details: Exercise purpose/form. Self management techniques. Person educated: Patient Education method: Explanation, Demonstration, Tactile cues, Verbal cues, and Handouts Education comprehension: verbalized understanding, returned demonstration, and needs further education  HOME EXERCISE PROGRAM: Access Code: [email or web address] URL:  [email or web address] Date: 12/05/2023 Prepared by: Norton Blizzard  Exercises - Supine Cervical Retraction with Towel  - 3 x daily - 1 sets - 10 reps - 3sec hold - Forearm Walks on Wall with Resistance Band  - 1 x daily - 2-3 sets - 10 reps - Shoulder External Rotation and Scapular Retraction with Resistance  - 1 x daily - 3 sets - 15 reps - Scapular Protraction at Wall  - 1 x daily - 2-3 sets - 20 reps  ASSESSMENT:  CLINICAL IMPRESSION: Patient with improved pain and function since last PT session. Continued with similar interventions with progressions as tolerated. She was able to tolerate chest press with scapular protraction but with quick neurologic fatigue at L elbow extension. Patient would benefit from continued management of limiting condition by skilled physical therapist to address remaining impairments and functional limitations to work towards stated goals and return to PLOF or maximal functional independence.    From initial PT evaluation on 11/09/2023:  58yoF presenting with ongoing neck pain s/p ADCF 6 months prior, is unable to tolerate head movements required for ADL/IADL,driving and has been unable to resume moderate to heavy lifting for return to work. Exam revealing of restricted ROM in cervical spine. Patient will benefit from skilled physical therapy intervention to reduce deficits and impairments identified in evaluation, in order to reduce pain, improve quality of life, and maximize activity tolerance for ADL, IADL, and leisure/fitness. Physical therapy will help pt achieve long and short term goals of care.     OBJECTIVE IMPAIRMENTS: decreased activity tolerance, decreased ROM, and pain.   ACTIVITY LIMITATIONS: carrying, lifting, bending, sitting, bathing, dressing, and reach over head  PARTICIPATION LIMITATIONS: meal prep, cleaning, laundry, interpersonal relationship, driving, shopping, community activity, occupation, and yard work  PERSONAL FACTORS:  Age, Fitness, Past/current experiences, and Time since onset of injury/illness/exacerbation are also affecting patient's functional outcome.   REHAB POTENTIAL: Excellent  CLINICAL DECISION MAKING: Evolving/moderate complexity  EVALUATION COMPLEXITY: Moderate   GOALS: Goals reviewed with patient? No  SHORT TERM GOALS: Target date: 12/10/23  Improved cervical rotation ROM by >12 degrees bilat Baseline: ~50 degrees bilat Goal status: In progress  2.  Tolerance of RDL lift ~20lb x20 reps without increased pain >2 points NPRS.  Baseline:  Goal status: In progress  3.  Regular performance of HEP for cervical ROM improvement and BUE loading.  Baseline:  Goal status: In progress  LONG TERM GOALS: Target date: 01/04/24  Pt to demonstrate cervical rotation ROM>70 degrees bilat and combined lumbar+thoracic+cervical rotation >100 degrees bilat Baseline:  Goal status: In progress  2.  Pt to demonstrate ability to lift 40lb from table height and carry x131ft in preparation for return to work related duties.  Baseline:  Goal status: In progress  3.  Pt to report successful performance of all home-care tasks (activity modifications as needed) without pain exacerbation >1/10.   Baseline:  Goal status: In progress  4.  Pt to report readiness for return to work and to improve NDI by >30%.  Baseline: NDI: 50% on 11/09/23 Goal status: In progress  PLAN:  PT FREQUENCY: 1-2x/week  PT DURATION: 8 weeks  PLANNED INTERVENTIONS: 97110-Therapeutic exercises, 97530- Therapeutic activity, [id number]- Neuromuscular re-education, 97535- Self Care, 56213- Manual therapy, G0283- Electrical stimulation (unattended), [phone]- Electrical stimulation (manual), Patient/Family education, Dry Needling, Spinal mobilization, Scar mobilization, DME instructions, Cryotherapy, and Moist heat  PLAN FOR NEXT SESSION:  Update HEP as appropriate, manual therapy and stretching as tolerated to help decrease soft tissue  tension, progressive postural/UE/functional motor control/strengthening/ROM/stretch as appropriate.   Carilyn Charles. Artemio Larry, PT, DPT 12/07/23, 10:31 AM  [facility] [address] [place], [place] [address] P: [phone] I F: [phone]

## 2023-12-12 ENCOUNTER — Ambulatory Visit: Admitting: Physical Therapy

## 2023-12-12 ENCOUNTER — Telehealth: Payer: Self-pay | Admitting: Physical Therapy

## 2023-12-12 NOTE — Telephone Encounter (Signed)
 Attempted to contact patient after she missed her PT appointment scheduled at 2:30pm today. Outgoing voicemail message in spanish and no opportunity to leave message (patient does speak English and declined interpreter in the past).   This is patient's second no-show since starting episode of care. Per cancellation policy, requested support staff remove her from the schedule. The cancellation policy has been reviewed with her in the past and it has been emphasized that she needs to call if she is unable to make her appointments.   Carilyn Charles. Artemio Larry, PT, DPT 12/12/23, 3:03 PM  [facility] [address] [place], [place] [address] P: [phone] I F: [phone]

## 2023-12-14 ENCOUNTER — Ambulatory Visit: Admitting: Physical Therapy

## 2023-12-18 ENCOUNTER — Encounter: Payer: Self-pay | Admitting: Physical Therapy

## 2023-12-18 ENCOUNTER — Ambulatory Visit: Admitting: Physical Therapy

## 2023-12-18 DIAGNOSIS — M6281 Muscle weakness (generalized): Secondary | ICD-10-CM

## 2023-12-18 DIAGNOSIS — G8929 Other chronic pain: Secondary | ICD-10-CM

## 2023-12-18 DIAGNOSIS — M542 Cervicalgia: Secondary | ICD-10-CM

## 2023-12-18 NOTE — Therapy (Signed)
 [facility] [address], [place], [address] Phone: [phone]   Fax:  [phone]  Patient Details  Name: Tonya Mueller MRN: [id number] Date of Birth: 03-Sep-1965   Encounter Date: 12/18/2023   Patient arrived to appointment > 20 min late and wanted to speak to physical therapist. Her appointments scheduled out had been removed due to excessive no-show appointments and she is currently allowed to schedule one visit at a time. Patient explained that she was late to appointments because she only has one car available and is not able to get here on time. She expected PT could still treat her when late by 20 minutes. PT explained that we set aside approx 40 minutes for her and that she must be on time and not 20 min late to provide the appropriate care and utilize the time we had set aside for her; that the clinic does not get paid for the time she is not here. Patient stated other clinics could treat her late without a problem. She also said she was bored of the bands and her neck felt the same anyway. Patient was provided with a copy of her PT order/referral at her request so she could take it to another clinic. PT was understanding and said it makes sense that this is not a good fit for her if she is unable to get here and we are unable to accommodate her scheduling needs. She decided to schedule another visit on 12/20/23 at 9am and said "we'll see" when asked for clarification of her intentions for continuing PT at this location. Patient was not seen for PT this date, due to excessive lateness without a phone call (constituting another no-show).   Carilyn Charles. Artemio Larry, PT, DPT 12/18/23, 4:58 PM   [facility] [address] [place], [address] Phone: [phone]   Fax:  [phone]

## 2023-12-19 ENCOUNTER — Ambulatory Visit: Admitting: Physical Therapy

## 2023-12-20 ENCOUNTER — Ambulatory Visit: Admitting: Physical Therapy

## 2023-12-20 ENCOUNTER — Encounter: Admitting: Physical Therapy

## 2023-12-20 ENCOUNTER — Encounter: Payer: Self-pay | Admitting: Physical Therapy

## 2023-12-20 DIAGNOSIS — M6281 Muscle weakness (generalized): Secondary | ICD-10-CM

## 2023-12-20 DIAGNOSIS — G8929 Other chronic pain: Secondary | ICD-10-CM

## 2023-12-20 DIAGNOSIS — M542 Cervicalgia: Secondary | ICD-10-CM
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
## 2023-12-20 NOTE — Patient Instructions (Addendum)
 Trigger Point Dry Needling  What is Trigger Point Dry Needling (DN)? DN is a physical therapy technique used to treat muscle pain and dysfunction. Specifically, DN helps deactivate muscle trigger points (muscle knots).  A thin filiform needle is used to penetrate the skin and stimulate the underlying trigger point. The goal is for a local twitch response (LTR) to occur and for the trigger point to relax. No medication of any kind is injected during the procedure.   What Does Trigger Point Dry Needling Feel Like?  The procedure feels different for each individual patient. Some patients report that they do not actually feel the needle enter the skin and overall the process is not painful. Very mild bleeding may occur. However, many patients feel a deep cramping in the muscle in which the needle was inserted. This is the local twitch response.   How Will I feel after the treatment? Soreness is normal, and the onset of soreness may not occur for a few hours. Typically this soreness does not last longer than two days.  Bruising is uncommon, however; ice can be used to decrease any possible bruising.  In rare cases feeling tired or nauseous after the treatment is normal. In addition, your symptoms may get worse before they get better, this period will typically not last longer than 24 hours.   What Can I do After My Treatment? Increase your hydration by drinking more water for the next 24 hours.  You may place ice or heat on the areas treated that have become sore, however, do not use heat on inflamed or bruised areas. Heat often brings more relief post needling. You can continue your regular activities, but vigorous activity is not recommended initially after the treatment for 24 hours. DN is best combined with other physical therapy such as strengthening, stretching, and other therapies.   What are the complications? While your therapist has had extensive training in minimizing the risks of trigger  point dry needling, it is important to understand the risks of any procedure.  Risks include bleeding, pain, fatigue, hematoma, infection, vertigo, nausea or nerve involvement. Monitor for any changes to your skin or sensation. Contact your therapist or MD with concerns.  A rare but serious complication is a pneumothorax over or near your middle and upper chest and back If you have dry needling in this area, monitor for the following symptoms: Shortness of breath on exertion and/or Difficulty taking a deep breath and/or Chest Pain and/or A dry cough If any of the above symptoms develop, please go to the nearest emergency room or call 911. Tell them you had dry needling over your thorax and report any symptoms you are having. Please follow-up with your treating therapist after you complete the medical evaluation.  Puncin seca en puntos gatillo/de tensin Trigger Point Dry Needling  Qu es la puncin seca en puntos gatillo/de tensin (DN)? La DN es una tcnica de fisioterapia [place] para tratar Chief Technology Officer y las disfunciones musculares. Especficamente, la DN ayuda a desactivar los puntos gatillo musculares (nudos musculares). Se utiliza una aguja filiforme fina para penetrar en la piel y Risk manager el punto gatillo que se encuentra debajo. El objetivo es que se produzca una respuesta de Armed forces logistics/support/administrative officer (LTR) y que el punto gatillo se relaje. Durante el procedimiento no se inyecta ningn tipo de medicacin.    Qu se siente al aplicar la puncin seca en los puntos gatillo/de tensin? El procedimiento es diferente para cada paciente. Algunos pacientes dicen que en realidad  no sienten la entrada de la [employer] piel y que, en general, el proceso no es doloroso. Puede haber un sangrado muy leve. Sin embargo, muchos pacientes sienten un profundo calambre en el msculo en el que se ha introducido la [place]. Se trata de la respuesta de contraccin local.  Cmo me sentir despus del  tratamiento? El dolor es normal y puede que no aparezca hasta pasadas algunas horas. Generalmente, este dolor no dura ms [employer].   Los moretones no son frecuentes; sin embargo, se puede Chemical engineer hielo para reducirlos.   En raros casos es normal sentir cansancio o nuseas despus del tratamiento. Adems, sus sntomas pueden empeorar antes de mejorar, este perodo no durar por lo general ms de 24 horas.  Qu puedo hacer despus del tratamiento? Aumente su hidratacin tomando ms agua durante las prximas 24 horas. Puede ponerse hielo o calor en las zonas tratadas que hayan quedado doloridas; sin embargo, no use calor en zonas inflamadas o con moretones. El calor con frecuencia alivia ms despus de la insercin de agujas. Puede continuar con sus actividades normales, pero no se recomienda hacer actividades vigorosas inicialmente despus del tratamiento por 24 horas. La DN funciona mejor cuando se [place] con otras terapias fsicas como el fortalecimiento, los estiramientos y [place] terapias.  Cules son las complicaciones? Aunque su terapeuta cuenta con una capacitacin amplia para minimizar los riesgos de la puncin seca en los puntos gatillo/de tensin, es importante conocer los riesgos de cualquier procedimiento. Los riesgos incluyen sangrado, Engineer, mining, fatiga, hematoma, infeccin, vrtigo, nuseas o afeccin de los nervios. Obsrvese para cualquier cambio en la piel o en la sensibilidad. Hable con su terapeuta o mdico si tiene inquietudes. Una complicacin poco frecuente pero grave es un neumotrax sobre o cerca de las zonas media y superior de su pecho y espalda. Si le hacen puncin seca en esta rea, obsrvese para los siguientes sntomas: Dificultad para respirar con el esfuerzo y/o Dificultad para respirar profundamente y/o Engineer, mining en el pecho y/o [place] tos seca Si aparece alguno de los sntomas anteriores, por favor vaya a la sala de urgencias ms cercana o llame al 911. Explqueles que  le hicieron puncin seca sobre el trax e infrmeles de cualquier sntoma que tenga. Haga un seguimiento con el(la) terapeuta que le atendi despus de completar la evaluacin mdica.

## 2023-12-21 ENCOUNTER — Encounter: Admitting: Physical Therapy

## 2023-12-21 ENCOUNTER — Encounter: Payer: Self-pay | Admitting: Gastroenterology

## 2023-12-21 ENCOUNTER — Telehealth: Payer: Self-pay | Admitting: Neurosurgery

## 2023-12-21 NOTE — Telephone Encounter (Signed)
 I have a fax from her disability [facility], they are asking for her return to work date and her next appt with our office. The last doctor's note has her last day out to be 12/21/2023. Her last PT visit is on 12/26/2023. Can she return to work tomorrow? Do you want to see her after her PT visit to re-evaluate her work status?

## 2023-12-22 NOTE — Telephone Encounter (Signed)
 Note completed so she can return to work with no restrictions.

## 2023-12-22 NOTE — Telephone Encounter (Signed)
 Work note faxed to [employer]

## 2023-12-25 ENCOUNTER — Encounter: Admitting: Physical Therapy

## 2023-12-26 ENCOUNTER — Encounter: Admitting: Physical Therapy

## 2023-12-26 ENCOUNTER — Encounter: Payer: Self-pay | Admitting: Physical Therapy

## 2023-12-26 ENCOUNTER — Ambulatory Visit: Attending: Neurosurgery | Admitting: Physical Therapy

## 2023-12-26 DIAGNOSIS — M546 Pain in thoracic spine: Secondary | ICD-10-CM | POA: Insufficient documentation

## 2023-12-26 DIAGNOSIS — M6281 Muscle weakness (generalized): Secondary | ICD-10-CM | POA: Insufficient documentation

## 2023-12-26 DIAGNOSIS — M542 Cervicalgia: Secondary | ICD-10-CM | POA: Insufficient documentation

## 2023-12-26 DIAGNOSIS — G8929 Other chronic pain: Secondary | ICD-10-CM | POA: Diagnosis present

## 2023-12-26 NOTE — Therapy (Signed)
 OUTPATIENT PHYSICAL THERAPY TREATMENT   Patient Name: Tonya Mueller MRN: [id number] DOB:September 09, 1965, 58 y.o., female Today's Date: 12/26/2023  END OF SESSION:  PT End of Session - 12/26/23 1057     Visit Number 8    Number of Visits 12    Date for PT Re-Evaluation 01/04/24    Authorization Type [place] MEDICAID [place] COMPLETE HEALTH reporting period from 11/09/2023    Authorization Time Period CC BJSE#[id number] 3/25-5/15 for 12 PT visits    Authorization - Visit Number 7    Authorization - Number of Visits 12    Progress Note Due on Visit 10    PT Start Time 0906    PT Stop Time 0950    PT Time Calculation (min) 44 min    Activity Tolerance Patient tolerated treatment well;Patient limited by pain    Behavior During Therapy [facility] for tasks assessed/performed                    Past Medical History:  Diagnosis Date   Allergic rhinitis    Angio-edema    Cervical radiculopathy    Chronic sinusitis    COVID-19    Elevated liver enzymes    Fatty liver disease, nonalcoholic    GERD (gastroesophageal reflux disease)    Peroneal tendonitis, left    Plantar fasciitis    Situational depression    Trigger finger of left  hand    Type 2 diabetes mellitus (HCC)    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    Past Surgical History:  Procedure Laterality Date   ANTERIOR CERVICAL DECOMP/DISCECTOMY FUSION N/A 04/25/2023   Procedure: C6-7 ANTERIOR CERVICAL DISCECTOMY AND FUSION (GLOBUS FORGE);  Surgeon: Carroll Clamp, MD;  Location: [facility];  Service: Neurosurgery;  Laterality: N/A;   BREAST EXCISIONAL BIOPSY Left 2004-2005   benign   BREAST SURGERY     CESAREAN SECTION  2006   CHOLECYSTECTOMY     ETHMOIDECTOMY Left 05/24/2022   Procedure: ETHMOIDECTOMY- TOTAL WITH FRONTAL SINUS EXPLORATION;  Surgeon: Von Grumbling, MD;  Location: [facility];  Service: ENT;  Laterality: Left;  Diabetic   GALLBLADDER SURGERY     IMAGE GUIDED SINUS SURGERY Left 05/24/2022   Procedure: IMAGE  GUIDED SINUS SURGERY;  Surgeon: Von Grumbling, MD;  Location: [facility];  Service: ENT;  Laterality: Left;  PLACED DISK ON OR CHARGE NURSE DESK 8-23 KP   MAXILLARY ANTROSTOMY Left 05/24/2022   Procedure: MAXILLARY ANTROSTOMY WITH TISSUE REMOVAL;  Surgeon: Von Grumbling, MD;  Location: [facility];  Service: ENT;  Laterality: Left;   Patient Active Problem List   Diagnosis Date Noted   S/P cervical spinal fusion 04/25/2023   Cervical disc disorder with radiculopathy of cervical region 04/05/2023   Left arm weakness 04/05/2023    PCP: Naaman Au, NP  REFERRING PROVIDER: Henderson Lock, MD (neurosurgery)   REFERRING DIAG: Post surgical neck pain   THERAPY DIAG:  Cervicalgia  Chronic thoracic spine pain  Muscle weakness (generalized)  Rationale for Evaluation and Treatment: rehabilitation  ONSET DATE: April 24, 2024  SUBJECTIVE:  PERTINENT HISTORY:  Tonya Mueller is a 58yoF s/p C6-7 ACDF referred to OPPT from neurosurgical due to ongoing pain following surgery, inability to resume baseline functional use of BUE for return to work duties. ACDF in September 2024, FU with doctor in November noted radicular pain, has since improved but now pt has neck stiffness, Right posterolateral neck pain, and periscapular pain. EMG showed a chronic but not active radiculopathy Left. MRI showed some improvement in decompression. Pt referred to PT and pain management due to ongoing pain issues and need for conservative management. Dr. Felipe Horton has not ruled out facet referral pain as a factor. Patient has been out of work since June 2024.   Exercise history: was active before. Has an elliptical machine at home that she used to use regularly.   SUBJECTIVE STATEMENT: Patient states the day  she left last PT session her neck was really sore, later she felt better. When she sleeps with her head turned to the right or left she has pain at the right side of her neck. She also has started to feel her eyes jumping all day. This started 2 days ago. It happens about 10 times per day. The jumping is just below her eyes. It lasts for a few seconds. She puts pressure on the bottom of her eyes and sides of the head and it stops. It is unclear to her what it is associated with. She still has pain by her right ear and right side of her head. The pain does not seem to be associated with the jumping around her eyes. She said Dr. Felipe Horton said he was done with her. That her care now will be with Dr. Aleen Ammons and physical therapy. She states dr. Felipe Horton wanted her to have a functional capacity evaluation and it was part of her PT order. She continues to drive with the seat belt under her arm, because it is too uncomfortable to put the strap across her chest (feels like she is choking). Aaron Aas  PAIN:  NPRS: 0/10 pain at rest and up to 3/10 at right side of neck and sharp to ear when she turns to the right, left UE paresthesia (constant) with pain at lateral arm when she does something. She also feels like her throat has a lump at the right side (constant).     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  Education on C7 radiculopathy with extensive  discussion of  dermatomes, myotomes, progression so far, nerve healing times, prognosis, process for obtaining functional capacity evaluation (FCE), return to work and function, functional status, expectations.  Patient provided with information and paperwork needed to get FCE requested by provider.   Seated cervical spine retraction 1x10 (peripheralizing to left UE, peripheralized to left upper arm, discontinued)  Seated cervical spine AROM to check baselines and response to other interventions Several times throughout session.  Baseline: right and left (R > L) rotation increased right sided neck pain and ear pain  Seated  repeated cervical right sidebending  3x10 L UE paresthesia/pain decreased to baseline (constant tingling but not painful in arm) Decreased R base of neck pain but increased right ear "itchiness" and right head pain worse with more sets and remaining worse.   Seated cervical spine rotation Discontinued after a few reps due to rapidly increasing R ear or left UE pain/paresthesia  Seated repeated upper cervical spine flexion nod 2x10 Decreased R ear/head pain, did not further irritated L UE paresthesia if careful to nod and not retract lower cervical spine. Possibly improved L UE paresthesia (but not past constant baseline).  Added to HEP verbally (handout reference added below for reference)   Pt required multimodal cuing for proper technique and to facilitate improved neuromuscular control, strength, range of motion, and functional ability resulting in improved performance and form.    PATIENT EDUCATION:  Education details: Exercise purpose/form. Self management techniques. Person educated: Patient Education method: Explanation, Demonstration, Tactile cues, Verbal cues, and Handouts Education comprehension: verbalized understanding, returned demonstration, and needs further education  HOME EXERCISE PROGRAM: Access Code: [email or web address] URL:  [email or web address] Date: 12/05/2023 Prepared by: Alleen Isle  Exercises - Supine Cervical Retraction with Towel  - 3 x daily - 1 sets - 10 reps - 3sec hold - Forearm Walks on Wall with Resistance Band  - 1 x daily - 2-3 sets - 10 reps - Shoulder External Rotation and Scapular Retraction with Resistance  - 1 x daily - 3 sets - 15 reps - Scapular Protraction at Wall  - 1 x daily - 2-3 sets - 20 reps  HOME EXERCISE PROGRAM [[email or web address]]  View at [email or web address] code [id number] Suboccipital Stretch - (repeated upper cervical flexion nod with OP)  ASSESSMENT:  CLINICAL IMPRESSION: Patient here today with a lot of questions about the purpose of surgery and why she continues to have limitations in her left UE strength. Patient responded well to education about healing times, MRI results, surgery purpose, anatomy and function of neck and nerves to the UE. It became apparent that Dr. Felipe Horton had requested a Functional Capacity Evaluation (FCE) as well as PT treatment and that patient is interested in having that done. She was provided information on how to contact [facility] to get that done, since that seems to be the only local place that is performing them now. However she is likely going to have financial barriers to getting this done if it is not covered by insurance. Patient's right sided neck, ear, and head pain as well as her left UE paresthesia is easily affected by repeated movements at the neck. Retraction at the lower cervical spine seems to make her L UE pain and paresthesia significantly worse, but upper cervical spine flexion seems to make right neck and head pain better. She tolerated repeated upper cervical spine flexion with overpressure well as long as she was careful  not to retract her head and to use her right UE on top of her head for overpressure.  Plan to assess response to performing this every 2-3 hours over several days and proceed from there next visit. Patient would  benefit from continued management of limiting condition by skilled physical therapist to address remaining impairments and functional limitations to work towards stated goals and return to PLOF or maximal functional independence.    From initial PT evaluation on 11/09/2023:  58yoF presenting with ongoing neck pain s/p ADCF 6 months prior, is unable to tolerate head movements required for ADL/IADL,driving and has been unable to resume moderate to heavy lifting for return to work. Exam revealing of restricted ROM in cervical spine. Patient will benefit from skilled physical therapy intervention to reduce deficits and impairments identified in evaluation, in order to reduce pain, improve quality of life, and maximize activity tolerance for ADL, IADL, and leisure/fitness. Physical therapy will help pt achieve long and short term goals of care.     OBJECTIVE IMPAIRMENTS: decreased activity tolerance, decreased ROM, and pain.   ACTIVITY LIMITATIONS: carrying, lifting, bending, sitting, bathing, dressing, and reach over head  PARTICIPATION LIMITATIONS: meal prep, cleaning, laundry, interpersonal relationship, driving, shopping, community activity, occupation, and yard work  PERSONAL FACTORS: Age, Fitness, Past/current experiences, and Time since onset of injury/illness/exacerbation are also affecting patient's functional outcome.   REHAB POTENTIAL: Excellent  CLINICAL DECISION MAKING: Evolving/moderate complexity  EVALUATION COMPLEXITY: Moderate   GOALS: Goals reviewed with patient? No  SHORT TERM GOALS: Target date: 12/10/23  Improved cervical rotation ROM by >12 degrees bilat Baseline: ~50 degrees bilat Goal status: In progress  2.  Tolerance of RDL lift ~20lb x20 reps without increased pain >2 points NPRS.  Baseline:  Goal status: In progress  3.  Regular performance of HEP for cervical ROM improvement and BUE loading.  Baseline:  Goal status: In progress  LONG TERM GOALS: Target  date: 01/04/24  Pt to demonstrate cervical rotation ROM>70 degrees bilat and combined lumbar+thoracic+cervical rotation >100 degrees bilat Baseline:  Goal status: In progress  2.  Pt to demonstrate ability to lift 40lb from table height and carry x156ft in preparation for return to work related duties.  Baseline:  Goal status: In progress  3.  Pt to report successful performance of all home-care tasks (activity modifications as needed) without pain exacerbation >1/10.   Baseline:  Goal status: In progress  4.  Pt to report readiness for return to work and to improve NDI by >30%.  Baseline: NDI: 50% on 11/09/23 Goal status: In progress  PLAN:  PT FREQUENCY: 1-2x/week  PT DURATION: 8 weeks  PLANNED INTERVENTIONS: 97110-Therapeutic exercises, 97530- Therapeutic activity, [id number]- Neuromuscular re-education, 97535- Self Care, 60454- Manual therapy, [id number]- Electrical stimulation (unattended), [phone]- Electrical stimulation (manual), Patient/Family education, Dry Needling, Spinal mobilization, Scar mobilization, DME instructions, Cryotherapy, and Moist heat  PLAN FOR NEXT SESSION:  Update HEP as appropriate, manual therapy and stretching as tolerated to help decrease soft tissue tension, progressive postural/UE/functional motor control/strengthening/ROM/stretch as appropriate.   Carilyn Charles. Artemio Larry, PT, DPT 12/26/23, 5:31 PM  [facility] [address] [place], [place] [address] P: [phone] I F: [phone]

## 2023-12-27 ENCOUNTER — Encounter: Admitting: Physical Therapy

## 2023-12-28 ENCOUNTER — Ambulatory Visit: Admitting: Anesthesiology

## 2023-12-28 ENCOUNTER — Encounter
Admission: RE | Disposition: A | Discharge status: Home / Self Care | Payer: Self-pay | Source: Home / Self Care | Attending: Gastroenterology

## 2023-12-28 ENCOUNTER — Ambulatory Visit
Admission: RE | Admit: 2023-12-28 | Discharge: 2023-12-28 | Disposition: A | Discharge status: Home / Self Care | Attending: Gastroenterology | Admitting: Gastroenterology

## 2023-12-28 ENCOUNTER — Encounter: Payer: Self-pay | Admitting: Gastroenterology

## 2023-12-28 DIAGNOSIS — Z7985 Long-term (current) use of injectable non-insulin antidiabetic drugs: Secondary | ICD-10-CM | POA: Diagnosis not present

## 2023-12-28 DIAGNOSIS — Z83719 Family history of colon polyps, unspecified: Secondary | ICD-10-CM | POA: Diagnosis not present

## 2023-12-28 DIAGNOSIS — Z981 Arthrodesis status: Secondary | ICD-10-CM | POA: Insufficient documentation

## 2023-12-28 DIAGNOSIS — E119 Type 2 diabetes mellitus without complications: Secondary | ICD-10-CM | POA: Insufficient documentation

## 2023-12-28 DIAGNOSIS — E66813 Obesity, class 3: Secondary | ICD-10-CM | POA: Insufficient documentation

## 2023-12-28 DIAGNOSIS — K219 Gastro-esophageal reflux disease without esophagitis: Secondary | ICD-10-CM | POA: Diagnosis not present

## 2023-12-28 DIAGNOSIS — Z9049 Acquired absence of other specified parts of digestive tract: Secondary | ICD-10-CM | POA: Insufficient documentation

## 2023-12-28 DIAGNOSIS — Z98891 History of uterine scar from previous surgery: Secondary | ICD-10-CM | POA: Insufficient documentation

## 2023-12-28 DIAGNOSIS — F32A Depression, unspecified: Secondary | ICD-10-CM | POA: Insufficient documentation

## 2023-12-28 DIAGNOSIS — Z7984 Long term (current) use of oral hypoglycemic drugs: Secondary | ICD-10-CM | POA: Diagnosis not present

## 2023-12-28 DIAGNOSIS — Z6841 Body Mass Index (BMI) 40.0 and over, adult: Secondary | ICD-10-CM | POA: Diagnosis not present

## 2023-12-28 DIAGNOSIS — Z1211 Encounter for screening for malignant neoplasm of colon: Secondary | ICD-10-CM | POA: Insufficient documentation

## 2023-12-28 DIAGNOSIS — K6289 Other specified diseases of anus and rectum: Secondary | ICD-10-CM | POA: Insufficient documentation

## 2023-12-28 DIAGNOSIS — K5900 Constipation, unspecified: Secondary | ICD-10-CM | POA: Insufficient documentation

## 2023-12-28 DIAGNOSIS — K635 Polyp of colon: Secondary | ICD-10-CM | POA: Insufficient documentation

## 2023-12-28 HISTORY — PX: POLYPECTOMY: SHX149

## 2023-12-28 HISTORY — PX: COLONOSCOPY: SHX5424

## 2023-12-28 LAB — GLUCOSE, CAPILLARY: Glucose-Capillary: 122 mg/dL — ABNORMAL HIGH (ref 70–99)

## 2023-12-28 SURGERY — COLONOSCOPY
Anesthesia: General

## 2023-12-28 MED ORDER — PROPOFOL 1000 MG/100ML IV EMUL
INTRAVENOUS | Status: AC
  Filled 2023-12-28: qty 300

## 2023-12-28 MED ORDER — MIDAZOLAM HCL 2 MG/2ML IJ SOLN
INTRAMUSCULAR | Status: AC
  Filled 2023-12-28: qty 2

## 2023-12-28 MED ORDER — PROPOFOL 500 MG/50ML IV EMUL
INTRAVENOUS | Status: DC | PRN
  Administered 2023-12-28: 165 ug/kg/min via INTRAVENOUS

## 2023-12-28 MED ORDER — GLYCOPYRROLATE 0.2 MG/ML IJ SOLN
INTRAMUSCULAR | Status: DC | PRN
  Administered 2023-12-28: .2 mg via INTRAVENOUS

## 2023-12-28 MED ORDER — PHENYLEPHRINE 80 MCG/ML (10ML) SYRINGE FOR IV PUSH (FOR BLOOD PRESSURE SUPPORT)
PREFILLED_SYRINGE | INTRAVENOUS | Status: DC | PRN
  Administered 2023-12-28: 160 ug via INTRAVENOUS

## 2023-12-28 MED ORDER — PROPOFOL 1000 MG/100ML IV EMUL
INTRAVENOUS | Status: AC
  Filled 2023-12-28: qty 500

## 2023-12-28 MED ORDER — PROPOFOL 10 MG/ML IV BOLUS
INTRAVENOUS | Status: DC | PRN
  Administered 2023-12-28: 70 mg via INTRAVENOUS
  Administered 2023-12-28: 30 mg via INTRAVENOUS

## 2023-12-28 MED ORDER — MIDAZOLAM HCL 2 MG/2ML IJ SOLN
INTRAMUSCULAR | Status: DC | PRN
  Administered 2023-12-28: 2 mg via INTRAVENOUS

## 2023-12-28 MED ORDER — SODIUM CHLORIDE 0.9 % IV SOLN: INTRAVENOUS | Status: DC

## 2023-12-28 MED ORDER — LIDOCAINE HCL (CARDIAC) PF 100 MG/5ML IV SOSY
PREFILLED_SYRINGE | INTRAVENOUS | Status: DC | PRN
  Administered 2023-12-28: 100 mg via INTRAVENOUS

## 2023-12-28 NOTE — Anesthesia Preprocedure Evaluation (Signed)
 Anesthesia Evaluation  Patient identified by MRN, date of birth, ID band Patient awake    Reviewed: Allergy & Precautions, H&P , NPO status , Patient's Chart, lab work & pertinent test results, reviewed documented beta blocker date and time   Airway Mallampati: II   Neck ROM: full    Dental  (+) Poor Dentition   Pulmonary neg pulmonary ROS   Pulmonary exam normal        Cardiovascular Exercise Tolerance: Good negative cardio ROS Normal cardiovascular exam Rhythm:regular Rate:Normal     Neuro/Psych  PSYCHIATRIC DISORDERS  Depression     Neuromuscular disease    GI/Hepatic Neg liver ROS,GERD  ,,  Endo/Other  diabetes, Well Controlled, Type 2, Oral Hypoglycemic Agents  Class 3 obesity  Renal/GU negative Renal ROS  negative genitourinary   Musculoskeletal   Abdominal   Peds  Hematology negative hematology ROS (+)   Anesthesia Other Findings Past Medical History: No date: Allergic rhinitis No date: Angio-edema No date: Cervical radiculopathy No date: Chronic sinusitis No date: COVID-19 No date: Elevated liver enzymes No date: Fatty liver disease, nonalcoholic No date: GERD (gastroesophageal reflux disease) No date: Peroneal tendonitis, left No date: Plantar fasciitis No date: Situational depression No date: Trigger finger of left  hand No date: Type 2 diabetes mellitus (HCC) No date: 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 Past Surgical History: 04/25/2023: ANTERIOR CERVICAL DECOMP/DISCECTOMY FUSION; N/A     Comment:  Procedure: C6-7 ANTERIOR CERVICAL DISCECTOMY AND FUSION               (GLOBUS FORGE);  Surgeon: Carroll Clamp, MD;                Location: [facility] OR[facility];  Service: Neurosurgery;  Laterality:               N/A; 2004-2005: BREAST EXCISIONAL BIOPSY; Left     Comment:  benign No date: BREAST SURGERY 2006: CESAREAN SECTION No date: CHOLECYSTECTOMY 05/24/2022: ETHMOIDECTOMY; Left     Comment:  Procedure:  ETHMOIDECTOMY- TOTAL WITH FRONTAL SINUS               EXPLORATION;  Surgeon: Von Grumbling, MD;  Location:               [facility];  Service: ENT;  Laterality: Left;                Diabetic No date: GALLBLADDER SURGERY 05/24/2022: IMAGE GUIDED SINUS SURGERY; Left     Comment:  Procedure: IMAGE GUIDED SINUS SURGERY;  Surgeon:               Von Grumbling, MD;  Location: [facility];                Service: ENT;  Laterality: Left;  PLACED DISK ON OR               CHARGE NURSE DESK 8-23 KP 05/24/2022: MAXILLARY ANTROSTOMY; Left     Comment:  Procedure: MAXILLARY ANTROSTOMY WITH TISSUE REMOVAL;                Surgeon: Von Grumbling, MD;  Location: [facility];  Service: ENT;  Laterality: Left;   Reproductive/Obstetrics negative OB ROS                             Anesthesia Physical Anesthesia Plan  ASA:  3  Anesthesia Plan: General   Post-op Pain Management:    Induction:   PONV Risk Score and Plan:   Airway Management Planned:   Additional Equipment:   Intra-op Plan:   Post-operative Plan:   Informed Consent: I have reviewed the patients History and Physical, chart, labs and discussed the procedure including the risks, benefits and alternatives for the proposed anesthesia with the patient or authorized representative who has indicated his/her understanding and acceptance.     Dental Advisory Given  Plan Discussed with: CRNA  Anesthesia Plan Comments:        Anesthesia Quick Evaluation

## 2023-12-28 NOTE — Anesthesia Postprocedure Evaluation (Signed)
 Anesthesia Post Note  Patient: Tonya Mueller  Procedure(s) Performed: COLONOSCOPY POLYPECTOMY, INTESTINE  Patient location during evaluation: PACU Anesthesia Type: General Level of consciousness: awake and alert Pain management: pain level controlled Vital Signs Assessment: post-procedure vital signs reviewed and stable Respiratory status: spontaneous breathing, nonlabored ventilation, respiratory function stable and patient connected to nasal cannula oxygen Cardiovascular status: blood pressure returned to baseline and stable Postop Assessment: no apparent nausea or vomiting Anesthetic complications: no   No notable events documented.   Last Vitals:  Vitals:   12/28/23 0827 12/28/23 0837  BP:    Pulse:  99  Resp:    Temp:    SpO2: 99% 100%    Last Pain:  Vitals:   12/28/23 0837  TempSrc:   PainSc: 0-No pain                 Zula Hitch

## 2023-12-28 NOTE — Anesthesia Procedure Notes (Signed)
 Procedure Name: General with mask airway Date/Time: 12/28/2023 7:47 AM  Performed by: Niki Barter, CRNAPre-anesthesia Checklist: Patient identified, Emergency Drugs available, Suction available and Patient being monitored Patient Re-evaluated:Patient Re-evaluated prior to induction Oxygen Delivery Method: Simple face mask Induction Type: IV induction Placement Confirmation: positive ETCO2 and breath sounds checked- equal and bilateral Dental Injury: Teeth and Oropharynx as per pre-operative assessment

## 2023-12-28 NOTE — Op Note (Signed)
 [facility] Patient Name: Tonya Mueller Procedure Date: 12/28/2023 7:30 AM MRN: [id number] Account #: [id number] Date of Birth: 03-Jan-1966 Admit Type: Outpatient Age: 58 Room: [facility] ENDO ROOM 1 Gender: Female Note Status: Finalized Instrument Name: Colonoscope [id number] Procedure:             Colonoscopy Indications:           Screening for colorectal malignant neoplasm Providers:             Bridgett Camps, DO Referring MD:          Naaman Au Medicines:             Monitored Anesthesia Care Complications:         No immediate complications. Estimated blood loss:                         Minimal. Procedure:             Pre-Anesthesia Assessment:                        - Prior to the procedure, a History and Physical was                         performed, and patient medications and allergies were                         reviewed. The patient is competent. The risks and                         benefits of the procedure and the sedation options and                         risks were discussed with the patient. All questions                         were answered and informed consent was obtained.                         Patient identification and proposed procedure were                         verified by the physician, the nurse, the anesthetist                         and the technician in the endoscopy suite. Mental                         Status Examination: alert and oriented. Airway                         Examination: normal oropharyngeal airway and neck                         mobility. Respiratory Examination: clear to                         auscultation. CV Examination: RRR, no murmurs, no S3  or S4. Prophylactic Antibiotics: The patient does not                         require prophylactic antibiotics. Prior                         Anticoagulants: The patient has taken no anticoagulant                          or antiplatelet agents. ASA Grade Assessment: III - A                         patient with severe systemic disease. After reviewing                         the risks and benefits, the patient was deemed in                         satisfactory condition to undergo the procedure. The                         anesthesia plan was to use monitored anesthesia care                         (MAC). Immediately prior to administration of                         medications, the patient was re-assessed for adequacy                         to receive sedatives. The heart rate, respiratory                         rate, oxygen saturations, blood pressure, adequacy of                         pulmonary ventilation, and response to care were                         monitored throughout the procedure. The physical                         status of the patient was re-assessed after the                         procedure.                        After obtaining informed consent, the colonoscope was                         passed under direct vision. Throughout the procedure,                         the patient's blood pressure, pulse, and oxygen                         saturations were monitored continuously. The  Colonoscope was introduced through the anus and                         advanced to the the terminal ileum, with                         identification of the appendiceal orifice and IC                         valve. The colonoscopy was performed without                         difficulty. The patient tolerated the procedure well.                         The quality of the bowel preparation was evaluated                         using the BBPS [facility] Bowel Preparation Scale) with                         scores of: Right Colon = 3, Transverse Colon = 3 and                         Left Colon = 3 (entire mucosa seen well with no                         residual staining, small  fragments of stool or opaque                         liquid). The total BBPS score equals 9. The terminal                         ileum, ileocecal valve, appendiceal orifice, and                         rectum were photographed. Findings:      The perianal and digital rectal examinations were normal. Pertinent       negatives include normal sphincter tone.      The terminal ileum appeared normal. Estimated blood loss: none.      A 1 to 2 mm polyp was found in the recto-sigmoid colon. The polyp was       sessile. The polyp was removed with a jumbo cold forceps. Resection and       retrieval were complete. Estimated blood loss was minimal.      Anal papilla(e) were hypertrophied. Estimated blood loss: none.      The exam was otherwise without abnormality on direct and retroflexion       views. Impression:            - The examined portion of the ileum was normal.                        - One 1 to 2 mm polyp at the recto-sigmoid colon,                         removed with a jumbo cold forceps. Resected and  retrieved.                        - Anal papilla(e) were hypertrophied.                        - The examination was otherwise normal on direct and                         retroflexion views. Recommendation:        - Patient has a contact number available for                         emergencies. The signs and symptoms of potential                         delayed complications were discussed with the patient.                         Return to normal activities tomorrow. Written                         discharge instructions were provided to the patient.                        - Discharge patient to home.                        - Resume previous diet.                        - Continue present medications.                        - Await pathology results.                        - Repeat colonoscopy for surveillance based on                         pathology  results.                        - Return to referring physician as previously                         scheduled.                        - The findings and recommendations were discussed with                         the patient. Procedure Code(s):     --- Professional ---                        [id number], Colonoscopy, flexible; with biopsy, single or                         multiple Diagnosis Code(s):     --- Professional ---  Z12.11, Encounter for screening for malignant neoplasm                         of colon                        D12.7, Benign neoplasm of rectosigmoid junction                        K62.89, Other specified diseases of anus and rectum CPT copyright 2022 [employer] Association. All rights reserved. The codes documented in this report are preliminary and upon coder review may  be revised to meet current compliance requirements. Attending Participation:      I personally performed the entire procedure. Polo Brisk, DO Quintin Buckle DO, DO 12/28/2023 8:12:05 AM This report has been signed electronically. Number of Addenda: 0 Note Initiated On: 12/28/2023 7:30 AM Scope Withdrawal Time: 0 hours 9 minutes 45 seconds  Total Procedure Duration: 0 hours 17 minutes 53 seconds  Estimated Blood Loss:  Estimated blood loss was minimal.      [facility]

## 2023-12-28 NOTE — H&P (Signed)
 Pre-Procedure H&P   Patient ID: Tonya Mueller is a 58 y.o. female.  Gastroenterology Provider: Quintin Buckle, DO  Referring Provider: Rodena Citron, NP PCP: Efraim Grange, NP  Date: 12/28/2023  HPI [name] is a 58 y.o. female who presents today for Colonoscopy for Colorectal cancer screening; family history of colon polyps .  Patient undergoing screening colonoscopy.  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  She did have gastritis biopsies were negative otherwise.  Deals with occasional constipation.  No melena or hematochezia  Ozempic was last used 2 Fridays ago.  She status post cholecystectomy and C-section.  Underwent C-spine fusion in September 2024  Creatinine 0.6 hemoglobin 13.8 MCV 86 platelets 193,000   Past Medical History:  Diagnosis Date   Allergic rhinitis    Angio-edema    Cervical radiculopathy    Chronic sinusitis    COVID-19    Elevated liver enzymes    Fatty liver disease, nonalcoholic    GERD (gastroesophageal reflux disease)    Peroneal tendonitis, left    Plantar fasciitis    Situational depression    Trigger finger of left  hand    Type 2 diabetes mellitus (HCC)    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     Past Surgical History:  Procedure Laterality Date   ANTERIOR CERVICAL DECOMP/DISCECTOMY FUSION N/A 04/25/2023   Procedure: C6-7 ANTERIOR CERVICAL DISCECTOMY AND FUSION (GLOBUS FORGE);  Surgeon: Carroll Clamp, MD;  Location: [facility];  Service: Neurosurgery;  Laterality: N/A;   BREAST EXCISIONAL BIOPSY Left 2004-2005   benign   BREAST SURGERY     CESAREAN SECTION  2006   CHOLECYSTECTOMY     ETHMOIDECTOMY Left 05/24/2022   Procedure: ETHMOIDECTOMY- TOTAL WITH FRONTAL SINUS EXPLORATION;  Surgeon: Von Grumbling, MD;  Location: [facility];  Service: ENT;  Laterality: Left;  Diabetic   GALLBLADDER SURGERY     IMAGE GUIDED SINUS SURGERY Left  05/24/2022   Procedure: IMAGE GUIDED SINUS SURGERY;  Surgeon: Von Grumbling, MD;  Location: [facility];  Service: ENT;  Laterality: Left;  PLACED DISK ON OR CHARGE NURSE DESK 8-23 KP   MAXILLARY ANTROSTOMY Left 05/24/2022   Procedure: MAXILLARY ANTROSTOMY WITH TISSUE REMOVAL;  Surgeon: Von Grumbling, MD;  Location: [facility];  Service: ENT;  Laterality: Left;    Family History Father and mother with colon polyps No h/o GI disease or malignancy  Review of Systems  Constitutional:  Negative for activity change, appetite change, chills, diaphoresis, fatigue, fever and unexpected weight change.  HENT:  Negative for trouble swallowing and voice change.   Respiratory:  Negative for shortness of breath and wheezing.   Cardiovascular:  Negative for chest pain, palpitations and leg swelling.  Gastrointestinal:  Negative for abdominal distention, abdominal pain, anal bleeding, blood in stool, constipation, diarrhea, nausea, rectal pain and vomiting.  Musculoskeletal:  Negative for arthralgias and myalgias.  Skin:  Negative for color change and pallor.  Neurological:  Negative for dizziness, syncope and weakness.  Psychiatric/Behavioral:  Negative for confusion.   All other systems reviewed and are negative.    Medications No current facility-administered medications on file prior to encounter.   Current Outpatient Medications on File Prior to Encounter  Medication Sig Dispense Refill   atorvastatin  (LIPITOR) 20 MG tablet Take 20 mg by mouth daily.     DULoxetine  (CYMBALTA ) 60 MG capsule Take 60 mg by mouth daily.  gabapentin  (NEURONTIN ) 100 MG capsule Take 100 mg by mouth at bedtime. 400 mg at bedtime     omeprazole (PRILOSEC) 20 MG capsule Take 20 mg by mouth daily.     EPINEPHrine  0.3 mg/0.3 mL IJ SOAJ injection Inject 0.3 mg into the muscle as needed.     fluticasone (FLONASE) 50 MCG/ACT nasal spray Place into both nostrils daily.     ipratropium (ATROVENT) 0.03 %  nasal spray Place 2 sprays into both nostrils every 12 (twelve) hours.     levocetirizine (XYZAL) 5 MG tablet Take 5 mg by mouth every evening.     OZEMPIC, 1 MG/DOSE, 4 MG/3ML SOPN Inject 1 mg into the skin once a week.     senna (SENOKOT) 8.6 MG TABS tablet Take 1 tablet (8.6 mg total) by mouth daily as needed for mild constipation. 30 tablet 0   tiZANidine  (ZANAFLEX ) 4 MG tablet TAKE 1 TABLET BY MOUTH 3 TIMES DAILY. 90 tablet 0   triamcinolone  cream (KENALOG ) 0.1 % Apply 1 Application topically 2 (two) times daily as needed.      Pertinent medications related to GI and procedure were reviewed by me with the patient prior to the procedure   Current Facility-Administered Medications:    0.9 %  sodium chloride  infusion, , Intravenous, Continuous, Quintin Buckle, DO, Last Rate: 20 mL/hr at 12/28/23 0724, New Bag at 12/28/23 0724  sodium chloride  20 mL/hr at 12/28/23 6440       Allergies  Allergen Reactions   Cortisone Other (See Comments) and Swelling    Patient experienced severe stomach pains and bloating; 05/24/22: pt has had an injection recently without issues  Liver failure  Liver failure - subacute & reversible; diagnosed after she had severe bloating & stomach pain after numerous cortisone injections over a 6 month timeframe. Has had isolated cortisone injections which were better spaced out without these difficulties & has tolerated oral steroids without difficulty in short courses as well. This reaction only occurred after the multiple injections within that 6 months.   Liver failure - subacute & reversible; diagnosed after she had severe bloating & stomach pain after numerous cortisone injections over a 6 month timeframe. Has had isolated cortisone injections which were better spaced out without these difficulties & has tolerated oral steroids without difficulty in short courses as well. This reaction only occurred after the multiple injections within that 6 months.    Allergies were reviewed by me prior to the procedure  Objective   Body mass index is 42.14 kg/m. Vitals:   12/28/23 0708  BP: 115/79  Pulse: 81  Resp: 20  Temp: (!) 96.5 F (35.8 C)  TempSrc: Temporal  SpO2: 95%  Weight: 101.2 kg     Physical Exam Vitals and nursing note reviewed.  Constitutional:      General: She is not in acute distress.    Appearance: Normal appearance. She is obese. She is not ill-appearing, toxic-appearing or diaphoretic.  HENT:     Head: Normocephalic and atraumatic.     Nose: Nose normal.     Mouth/Throat:     Mouth: Mucous membranes are moist.     Pharynx: Oropharynx is clear.  Eyes:     General: No scleral icterus.    Extraocular Movements: Extraocular movements intact.     Comments: Bilateral pupil dilation  Cardiovascular:     Rate and Rhythm: Normal rate and regular rhythm.     Heart sounds: Normal heart sounds. No murmur heard.  No friction rub. No gallop.  Pulmonary:     Effort: Pulmonary effort is normal. No respiratory distress.     Breath sounds: Normal breath sounds. No wheezing, rhonchi or rales.  Abdominal:     General: Bowel sounds are normal. There is no distension.     Palpations: Abdomen is soft.     Tenderness: There is no abdominal tenderness. There is no guarding or rebound.  Musculoskeletal:     Cervical back: Neck supple.     Right lower leg: No edema.     Left lower leg: No edema.  Skin:    General: Skin is warm and dry.     Coloration: Skin is not jaundiced or pale.  Neurological:     General: No focal deficit present.     Mental Status: She is alert and oriented to person, place, and time. Mental status is at baseline.  Psychiatric:        Mood and Affect: Mood normal.        Behavior: Behavior normal.        Thought Content: Thought content normal.        Judgment: Judgment normal.      Assessment:  [name] is a 58 y.o. female  who presents today for Colonoscopy for Colorectal cancer  screening; family history of colon polyps .  Plan:  Colonoscopy with possible intervention today  Colonoscopy with possible biopsy, control of bleeding, polypectomy, and interventions as necessary has been discussed with the patient/patient representative. Informed consent was obtained from the patient/patient representative after explaining the indication, nature, and risks of the procedure including but not limited to death, bleeding, perforation, missed neoplasm/lesions, cardiorespiratory compromise, and reaction to medications. Opportunity for questions was given and appropriate answers were provided. Patient/patient representative has verbalized understanding is amenable to undergoing the procedure.   Quintin Buckle, DO  [facility]  Portions of the record may have been created with voice recognition software. Occasional wrong-word or 'sound-a-like' substitutions may have occurred due to the inherent limitations of voice recognition software.  Read the chart carefully and recognize, using context, where substitutions may have occurred.

## 2023-12-28 NOTE — Interval H&P Note (Signed)
 History and Physical Interval Note: Preprocedure H&P from 12/28/23  was reviewed and there was no interval change after seeing and examining the patient.  Written consent was obtained from the patient after discussion of risks, benefits, and alternatives. Patient has consented to proceed with Colonoscopy with possible intervention   12/28/2023 7:31 AM  Tonya Mueller  has presented today for surgery, with the diagnosis of Screen for colon cancer (Z12.11).  The various methods of treatment have been discussed with the patient and family. After consideration of risks, benefits and other options for treatment, the patient has consented to  Procedure(s) with comments: COLONOSCOPY (N/A) - DM as a surgical intervention.  The patient's history has been reviewed, patient examined, no change in status, stable for surgery.  I have reviewed the patient's chart and labs.  Questions were answered to the patient's satisfaction.     Quintin Buckle

## 2023-12-28 NOTE — Transfer of Care (Signed)
 Immediate Anesthesia Transfer of Care Note  Patient: Tonya Mueller  Procedure(s) Performed: COLONOSCOPY POLYPECTOMY, INTESTINE  Patient Location: Endoscopy Unit  Anesthesia Type:General  Level of Consciousness: drowsy and patient cooperative  Airway & Oxygen Therapy: Patient Spontanous Breathing and Patient connected to face mask oxygen  Post-op Assessment: Report given to RN and Post -op Vital signs reviewed and stable  Post vital signs: Reviewed and stable  Last Vitals:  Vitals Value Taken Time  BP 116/68 12/28/23 0818  Temp 36.1 C 12/28/23 0817  Pulse 90 12/28/23 0819  Resp 21 12/28/23 0819  SpO2 99 % 12/28/23 0819  Vitals shown include unfiled device data.  Last Pain:  Vitals:   12/28/23 0817  TempSrc: Temporal  PainSc: Asleep         Complications: No notable events documented.

## 2023-12-29 ENCOUNTER — Encounter: Payer: Self-pay | Admitting: Gastroenterology

## 2023-12-29 LAB — SURGICAL PATHOLOGY

## 2024-01-01 ENCOUNTER — Encounter: Payer: Self-pay | Admitting: Physical Therapy

## 2024-01-01 ENCOUNTER — Ambulatory Visit

## 2024-01-01 ENCOUNTER — Encounter: Admitting: Physical Therapy

## 2024-01-01 DIAGNOSIS — M542 Cervicalgia: Secondary | ICD-10-CM | POA: Diagnosis not present

## 2024-01-01 DIAGNOSIS — G8929 Other chronic pain: Secondary | ICD-10-CM

## 2024-01-01 DIAGNOSIS — M6281 Muscle weakness (generalized): Secondary | ICD-10-CM

## 2024-01-01 NOTE — Therapy (Signed)
 OUTPATIENT PHYSICAL THERAPY TREATMENT   Patient Name: Tonya Mueller MRN: [id number] DOB:09-28-1965, 58 y.o., female Today's Date: 01/01/2024  END OF SESSION:  PT End of Session - 01/01/24 0952     Visit Number 9    Number of Visits 12    Date for PT Re-Evaluation 01/04/24    Authorization Type [place]  MEDICAID [facility] COMPLETE HEALTH reporting period from 11/09/2023    Authorization Time Period CC IONG#[id number] 3/25-5/15 for 12 PT visits    Authorization - Number of Visits 12    Progress Note Due on Visit 10    PT Start Time 0952    PT Stop Time 1030    PT Time Calculation (min) 38 min    Activity Tolerance Patient tolerated treatment well;Patient limited by pain    Behavior During Therapy [facility] for tasks assessed/performed                    Past Medical History:  Diagnosis Date   Allergic rhinitis    Angio-edema    Cervical radiculopathy    Chronic sinusitis    COVID-19    Elevated liver enzymes    Fatty liver disease, nonalcoholic    GERD (gastroesophageal reflux disease)    Peroneal tendonitis, left    Plantar fasciitis    Situational depression    Trigger finger of left  hand    Type 2 diabetes mellitus (HCC)    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    Past Surgical History:  Procedure Laterality Date   ANTERIOR CERVICAL DECOMP/DISCECTOMY FUSION N/A 04/25/2023   Procedure: C6-7 ANTERIOR CERVICAL DISCECTOMY AND FUSION (GLOBUS FORGE);  Surgeon: Carroll Clamp, MD;  Location: [facility] ORS;  Service: Neurosurgery;  Laterality: N/A;   BREAST EXCISIONAL BIOPSY Left 2004-2005   benign   BREAST SURGERY     CESAREAN SECTION  2006   CHOLECYSTECTOMY     COLONOSCOPY N/A 12/28/2023   Procedure: COLONOSCOPY;  Surgeon: Quintin Buckle, DO;  Location: [facility];  Service: Gastroenterology;  Laterality: N/A;  DM   ETHMOIDECTOMY Left 05/24/2022   Procedure: ETHMOIDECTOMY- TOTAL WITH FRONTAL SINUS EXPLORATION;  Surgeon: Von Grumbling, MD;  Location: [facility];   Service: ENT;  Laterality: Left;  Diabetic   GALLBLADDER SURGERY     IMAGE GUIDED SINUS SURGERY Left 05/24/2022   Procedure: IMAGE GUIDED SINUS SURGERY;  Surgeon: Von Grumbling, MD;  Location: [facility];  Service: ENT;  Laterality: Left;  PLACED DISK ON OR CHARGE NURSE DESK 8-23 KP   MAXILLARY ANTROSTOMY Left 05/24/2022   Procedure: MAXILLARY ANTROSTOMY WITH TISSUE REMOVAL;  Surgeon: Von Grumbling, MD;  Location: [facility];  Service: ENT;  Laterality: Left;   POLYPECTOMY  12/28/2023   Procedure: POLYPECTOMY, INTESTINE;  Surgeon: Quintin Buckle, DO;  Location: [facility] ENDOSCOPY;  Service: Gastroenterology;;   Patient Active Problem List   Diagnosis Date Noted   S/P cervical spinal fusion 04/25/2023   Cervical disc disorder with radiculopathy of cervical region 04/05/2023   Left arm weakness 04/05/2023    PCP: Naaman Au, NP  REFERRING PROVIDER: Henderson Lock, MD (neurosurgery)   REFERRING DIAG: Post surgical neck pain   THERAPY DIAG:  Cervicalgia  Chronic thoracic spine pain  Muscle weakness (generalized)  Rationale for Evaluation and Treatment: rehabilitation  ONSET DATE: April 24, 2024  SUBJECTIVE:  PERTINENT HISTORY:  Tonya Mueller is a 58yoF s/p C6-7 ACDF referred to OPPT from neurosurgical due to ongoing pain following surgery, inability to resume baseline functional use of BUE for return to work duties. ACDF in September 2024, FU with doctor in November noted radicular pain, has since improved but now pt has neck stiffness, Right posterolateral neck pain, and periscapular pain. EMG showed a chronic but not active radiculopathy Left. MRI showed some improvement in decompression. Pt referred to PT and pain management due to ongoing pain issues and need  for conservative management. Dr. Felipe Horton has not ruled out facet referral pain as a factor. Patient has been out of work since June 2024.   Exercise history: was active before. Has an elliptical machine at home that she used to use regularly.   SUBJECTIVE STATEMENT: Pt reports B jaw pain since having her colonoscopy. Unable to give number for RUE. LUE N/T remains the same.  PAIN:  NPRS: 0/10 pain at rest and up to 3/10 at right side of neck and sharp to ear when she turns to the right, left UE paresthesia (constant) with pain at lateral arm when she does something. She also feels like her throat has a lump at the right side (constant).     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  Hook lying for spine supported ROM exercise   Cervical Rotation R/L: 2x10/side   Cervical flexion nodding: 2x10   Cervical R/L lateral flexion: x10/side   AAROM with dowel shoulder flexion/extension for gentle thoracic mobility: 5#, 2x8  Chest press with dowel and 5# AW for pec major and serratus anterior strengthening. 2x8   There.Act:   Standing:   Scap retraction with Blue TB: 3x15   Standing low row shoulder extension with GTB: 3x15. Reports median nerve distribution L hand numbness post exercise.     Pt required multimodal cuing for proper technique and to facilitate improved neuromuscular control, strength, range of motion, and  functional ability resulting in improved performance and form.    PATIENT EDUCATION:  Education details: Exercise purpose/form. Self management techniques. Person educated: Patient Education method: Explanation, Demonstration, Tactile cues, Verbal cues, and Handouts Education comprehension: verbalized understanding, returned demonstration, and needs further education  HOME EXERCISE PROGRAM: Access Code: [email or web address] URL: [email or web address] Date: 12/05/2023 Prepared by: Alleen Isle  Exercises - Supine Cervical Retraction with Towel  - 3 x daily - 1 sets - 10 reps - 3sec  hold - Forearm Walks on Wall with Resistance Band  - 1 x daily - 2-3 sets - 10 reps - Shoulder External Rotation and Scapular Retraction with Resistance  - 1 x daily - 3 sets - 15 reps - Scapular Protraction at Wall  - 1 x daily - 2-3 sets - 20 reps  HOME EXERCISE PROGRAM [[id number]]  View at [email or web address] code [email or web address] Suboccipital Stretch - (repeated upper cervical flexion nod with OP)  ASSESSMENT:  CLINICAL IMPRESSION: Continuing PT POC addressing cervical mobility and pain. Trialed spine supported positions to address better pain relief/management but pt reports feeling the same. Also trialing AAROM shoulder motion for thoracic mobility and LUE strengthening. Pt appears to have R SCM mediated pain with cervical ranges placing this muscle on stretch. Further emphasis placed on periscapular strengthening with notable LUE difficulties with heavy compensations needing regular VC's for modification/correct with good carryover. Reports of hand numbness post session. Pain does not seem very prominent with exercise tolerating session well overall without complaint of worsening symptoms. Patient would benefit from continued management of limiting condition by skilled physical therapist to address remaining impairments and functional limitations to work towards stated goals and return to PLOF or maximal  functional independence.      From initial PT evaluation on 11/09/2023:  58yoF presenting with ongoing neck pain s/p ADCF 6 months prior, is unable to tolerate head movements required for ADL/IADL,driving and has been unable to resume moderate to heavy lifting for return to work. Exam revealing of restricted ROM in cervical spine. Patient will benefit from skilled physical therapy intervention to reduce deficits and impairments identified in evaluation, in order to reduce pain, improve quality of life, and maximize activity tolerance for ADL, IADL, and leisure/fitness. Physical therapy will help pt achieve long and short term goals of care.     OBJECTIVE IMPAIRMENTS: decreased activity tolerance, decreased ROM, and pain.   ACTIVITY LIMITATIONS: carrying, lifting, bending, sitting, bathing, dressing, and reach over head  PARTICIPATION LIMITATIONS: meal prep, cleaning, laundry, interpersonal relationship, driving, shopping, community activity, occupation, and yard work  PERSONAL FACTORS: Age, Fitness, Past/current experiences, and Time since onset of injury/illness/exacerbation are also affecting patient's functional outcome.   REHAB POTENTIAL: Excellent  CLINICAL DECISION MAKING: Evolving/moderate complexity  EVALUATION COMPLEXITY: Moderate   GOALS: Goals reviewed with patient? No  SHORT TERM GOALS: Target date: 12/10/23  Improved cervical rotation ROM by >12 degrees bilat Baseline: ~50 degrees bilat Goal status: In progress  2.  Tolerance of RDL lift ~20lb x20 reps without increased pain >2 points NPRS.  Baseline:  Goal status: In progress  3.  Regular performance of HEP for cervical ROM improvement and BUE loading.  Baseline:  Goal status: In progress  LONG TERM GOALS: Target date: 01/04/24  Pt to demonstrate cervical rotation ROM>70 degrees bilat and combined lumbar+thoracic+cervical rotation >100 degrees bilat Baseline:  Goal status: In progress  2.  Pt to demonstrate  ability to lift 40lb from table height and carry x180ft in preparation for return to work related duties.  Baseline:  Goal status: In progress  3.  Pt to report successful performance of all home-care tasks (activity modifications as needed) without pain exacerbation >1/10.   Baseline:  Goal status: In progress  4.  Pt to report readiness for return to work and to improve NDI by >30%.  Baseline: NDI: 50% on 11/09/23 Goal status: In progress  PLAN:  PT FREQUENCY: 1-2x/week  PT DURATION: 8 weeks  PLANNED INTERVENTIONS: 97110-Therapeutic exercises, 97530- Therapeutic activity,  11914- Neuromuscular re-education, [phone]- Self Care, 62130- Manual therapy, G0283- Electrical stimulation (unattended), [phone]- Electrical stimulation (manual), Patient/Family education, Dry Needling, Spinal mobilization, Scar mobilization, DME instructions, Cryotherapy, and Moist heat  PLAN FOR NEXT SESSION:  Update HEP as appropriate, manual therapy and stretching as tolerated to help decrease soft tissue tension, progressive postural/UE/functional motor control/strengthening/ROM/stretch as appropriate.   Marc Senior. Fairly IV, PT, DPT Physical Therapist- [place]  [facility]  01/01/24, 11:18 AM

## 2024-01-02 ENCOUNTER — Encounter: Admitting: Physical Therapy

## 2024-01-03 ENCOUNTER — Encounter: Admitting: Physical Therapy

## 2024-01-03 ENCOUNTER — Ambulatory Visit

## 2024-01-03 ENCOUNTER — Encounter: Payer: Self-pay | Admitting: Physical Therapy

## 2024-01-03 DIAGNOSIS — M6281 Muscle weakness (generalized): Secondary | ICD-10-CM

## 2024-01-03 DIAGNOSIS — G8929 Other chronic pain: Secondary | ICD-10-CM

## 2024-01-03 DIAGNOSIS — M542 Cervicalgia: Secondary | ICD-10-CM | POA: Diagnosis not present

## 2024-01-03 NOTE — Therapy (Signed)
 OUTPATIENT PHYSICAL THERAPY TREATMENT/PROGRESS NOTE/RE-CERTIFICATION Dates of Reporting Period: 11/09/23 - 01/03/24   Patient Name: Tonya Mueller MRN: [id number] DOB:[birth year], 58 y.o., female Today's Date: 01/03/2024  END OF SESSION:  PT End of Session - 01/03/24 0949     Visit Number 10    Number of Visits 12    Date for PT Re-Evaluation 02/28/24    Authorization Type [place] MEDICAID [place] COMPLETE HEALTH reporting period from 11/09/2023    Authorization Time Period --    Authorization - Number of Visits 12    Progress Note Due on Visit 10    PT Start Time 0950    PT Stop Time 1030    PT Time Calculation (min) 40 min    Activity Tolerance Patient tolerated treatment well;Patient limited by pain    Behavior During Therapy WFL for tasks assessed/performed                    Past Medical History:  Diagnosis Date   Allergic rhinitis    Angio-edema    Cervical radiculopathy    Chronic sinusitis    COVID-19    Elevated liver enzymes    Fatty liver disease, nonalcoholic    GERD (gastroesophageal reflux disease)    Peroneal tendonitis, left    Plantar fasciitis    Situational depression    Trigger finger of left  hand    Type 2 diabetes mellitus (HCC)    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    Past Surgical History:  Procedure Laterality Date   ANTERIOR CERVICAL DECOMP/DISCECTOMY FUSION N/A 04/25/2023   Procedure: C6-7 ANTERIOR CERVICAL DISCECTOMY AND FUSION (GLOBUS FORGE);  Surgeon: Carroll Clamp, MD;  Location: [facility] ORS;  Service: Neurosurgery;  Laterality: N/A;   BREAST EXCISIONAL BIOPSY Left 2004-2005   benign   BREAST SURGERY     CESAREAN SECTION  2006   CHOLECYSTECTOMY     COLONOSCOPY N/A 12/28/2023   Procedure: COLONOSCOPY;  Surgeon: Quintin Buckle, DO;  Location: [facility];  Service: Gastroenterology;  Laterality: N/A;  DM   ETHMOIDECTOMY Left 05/24/2022   Procedure: ETHMOIDECTOMY- TOTAL WITH FRONTAL SINUS EXPLORATION;  Surgeon: Von Grumbling,  MD;  Location: [facility];  Service: ENT;  Laterality: Left;  Diabetic   GALLBLADDER SURGERY     IMAGE GUIDED SINUS SURGERY Left 05/24/2022   Procedure: IMAGE GUIDED SINUS SURGERY;  Surgeon: Von Grumbling, MD;  Location: [facility];  Service: ENT;  Laterality: Left;  PLACED DISK ON OR CHARGE NURSE DESK 8-23 KP   MAXILLARY ANTROSTOMY Left 05/24/2022   Procedure: MAXILLARY ANTROSTOMY WITH TISSUE REMOVAL;  Surgeon: Von Grumbling, MD;  Location: [facility];  Service: ENT;  Laterality: Left;   POLYPECTOMY  12/28/2023   Procedure: POLYPECTOMY, INTESTINE;  Surgeon: Quintin Buckle, DO;  Location: [facility] END[facility];  Service: Gastroenterology;;   Patient Active Problem List   Diagnosis Date Noted   S/P cervical spinal fusion 04/25/2023   Cervical disc disorder with radiculopathy of cervical region 04/05/2023   Left arm weakness 04/05/2023    PCP: Naaman Au, NP  REFERRING PROVIDER: Henderson Lock, MD (neurosurgery)   REFERRING DIAG: Post surgical neck pain   THERAPY DIAG:  Cervicalgia  Chronic thoracic spine pain  Muscle weakness (generalized)  Rationale for Evaluation and Treatment: rehabilitation  ONSET DATE: April 24, 2024  SUBJECTIVE:  PERTINENT HISTORY:  Jaime Vanwieren is a 58yoF s/p C6-7 ACDF referred to OPPT from neurosurgical due to ongoing pain following surgery, inability to resume baseline functional use of BUE for return to work duties. ACDF in September 2024, FU with doctor in November noted radicular pain, has since improved but now pt has neck stiffness, Right posterolateral neck pain, and periscapular pain. EMG showed a chronic but not active radiculopathy Left. MRI showed some improvement in decompression. Pt referred to PT and pain management  due to ongoing pain issues and need for conservative management. Dr. Felipe Horton has not ruled out facet referral pain as a factor. Patient has been out of work since June 2024.   Exercise history: was active before. Has an elliptical machine at home that she used to use regularly.   SUBJECTIVE STATEMENT: Pt reports B jaw pain since having her colonoscopy. Unable to give number for RUE. LUE N/T remains the same.  PAIN:  NPRS: 0/10 pain at rest and up to 3/10 at right side of neck and sharp to ear when she turns to the right, left UE paresthesia (constant) with pain at lateral arm when she does something. She also feels like her throat has a lump at the right side (constant).     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 See clinical impression and goals section for details.      PATIENT EDUCATION:  Education details: Exercise purpose/form. Self management techniques. Person educated: Patient Education method: Explanation, Demonstration, Tactile cues, Verbal cues, and Handouts Education comprehension: verbalized understanding, returned demonstration, and needs further education  HOME EXERCISE PROGRAM: Access Code: [email or web address] URL: [email or web address] Date: 12/05/2023 Prepared by: Alleen Isle  Exercises - Supine Cervical Retraction with Towel  - 3 x daily - 1 sets - 10 reps - 3sec hold - Forearm Walks on Wall with Resistance Band  - 1 x daily - 2-3 sets - 10 reps - Shoulder External Rotation and Scapular Retraction with Resistance  - 1 x daily - 3 sets - 15 reps - Scapular Protraction at  Wall  - 1 x daily - 2-3 sets - 20 reps  HOME EXERCISE PROGRAM [[id number]]  View at [email or web address] code [id number] Suboccipital Stretch - (repeated upper cervical flexion nod with OP)  ASSESSMENT:  CLINICAL IMPRESSION: Pt at end of POC thus time spent reviewing goals. Pt reports her pain is tolerable, she continues to report concern and problems with her L hand N/T and LUE weakness due to her heavy lifting tasks at work. Pt does display improvements in NDI  and cervical AROM but remains vastly limited with R cervical rotations compared to L rotation. Pt displays significant difficulty being able to hold a 40# box only able to ambulate ~39' with regular L hand grip slipping off the box needing to use anterior L thigh to assist holding box up ultimately needed PT assist to prevent dropping box. Upon grip strength testing pt is vastly limited in LUE strength for age matched and decade matched norms. Pt vastly limited in ability to complete current job duties thus recommending additional 8 weeks of PT 2x/week.    From initial PT evaluation on 11/09/2023:  58yoF presenting with ongoing neck pain s/p ADCF 6 months prior, is unable to tolerate head movements required for ADL/IADL,driving and has been unable to resume moderate to heavy lifting for return to work. Exam revealing of restricted ROM in cervical spine. Patient will benefit from skilled physical therapy intervention to reduce deficits and impairments identified in evaluation, in order to reduce pain, improve quality of life, and maximize activity tolerance for ADL, IADL, and leisure/fitness. Physical therapy will help pt achieve long and short term goals of care.     OBJECTIVE IMPAIRMENTS: decreased activity tolerance, decreased ROM, and pain.   ACTIVITY LIMITATIONS: carrying, lifting, bending, sitting, bathing, dressing, and reach over head  PARTICIPATION LIMITATIONS: meal prep, cleaning, laundry, interpersonal relationship, driving, shopping,  community activity, occupation, and yard work  PERSONAL FACTORS: Age, Fitness, Past/current experiences, and Time since onset of injury/illness/exacerbation are also affecting patient's functional outcome.   REHAB POTENTIAL: Excellent  CLINICAL DECISION MAKING: Evolving/moderate complexity  EVALUATION COMPLEXITY: Moderate   GOALS: Goals reviewed with patient? No  SHORT TERM GOALS: Target date: 01/31/24  Improved cervical rotation ROM by >12 degrees bilat Baseline: ~50 degrees bilat; 01/03/24: 50/65 R/L Goal status: Partially met  2.  Tolerance of RDL lift ~20lb x20 reps without increased pain >2 points NPRS.  Baseline: deferred due to time Goal status: In progress  3.  Regular performance of HEP for cervical ROM improvement and BUE loading.  Baseline: 01/03/24: 100% compliance  Goal status: MET  LONG TERM GOALS: Target date: 02/28/24  Pt to demonstrate cervical rotation ROM>70 degrees bilat and combined lumbar+thoracic+cervical rotation >100 degrees bilat Baseline: 01/03/24: 50/65 degrees cervical rotation. Goal status: In progress  2.  Pt to demonstrate ability to lift 40lb from table height and carry x114ft in preparation for return to work related duties.  Baseline: 01/03/24: 34' significant difficulty being able to hold a 40# box only able to ambulate ~39' with regular L hand grip slipping off the box needing to use anterior L thigh to assist holding box up ultimately needed PT assist to prevent dropping box Goal status: In progress  3.  Pt to report successful performance of all home-care tasks (activity modifications as needed) without pain exacerbation >1/10.   Baseline: 01/03/24: 3/10 NPS.  Goal status: In progress  4.  Pt to report readiness for return to work and to improve NDI by >30%.  Baseline: NDI: 50% on 11/09/23; 01/03/24: 38% Goal status: In progress  5. Pt will improve LUE grip strength to age and sex matched norms to assist in return to prior heavy lifting work  duties.   Baseline: RUE: 53.33 lbs; LUE: 21.6 lbs  Goal status: Initial  PLAN:  PT FREQUENCY: 1-2x/week  PT DURATION: 8 weeks  PLANNED INTERVENTIONS: 97110-Therapeutic exercises, 97530- Therapeutic activity, [id number]- Neuromuscular re-education, 97535- Self Care, 16109- Manual therapy, G0283- Electrical stimulation (unattended), [phone]- Electrical stimulation (manual), Patient/Family education,  Dry Needling, Spinal mobilization, Scar mobilization, DME instructions, Cryotherapy, and Moist heat  PLAN FOR NEXT SESSION:  Update HEP as appropriate, manual therapy and stretching as tolerated to help decrease soft tissue tension, progressive postural/UE/functional motor control/strengthening/ROM/stretch as appropriate.   Marc Senior. Fairly IV, PT, DPT Physical Therapist- [place]  [facility]  01/03/24, 12:40 PM

## 2024-01-24 ENCOUNTER — Ambulatory Visit: Attending: Neurosurgery | Admitting: Physical Therapy

## 2024-01-24 ENCOUNTER — Encounter: Payer: Self-pay | Admitting: Physical Therapy

## 2024-01-24 DIAGNOSIS — M6281 Muscle weakness (generalized): Secondary | ICD-10-CM | POA: Diagnosis present

## 2024-01-24 DIAGNOSIS — G8929 Other chronic pain: Secondary | ICD-10-CM | POA: Diagnosis present

## 2024-01-24 DIAGNOSIS — M546 Pain in thoracic spine: Secondary | ICD-10-CM | POA: Insufficient documentation

## 2024-01-24 DIAGNOSIS — M542 Cervicalgia: Secondary | ICD-10-CM

## 2024-01-24 NOTE — Therapy (Signed)
 OUTPATIENT PHYSICAL THERAPY TREATMENT   Patient Name: Tonya Mueller MRN: [id number] DOB:01-04-1966, 58 y.o., female Today's Date: 01/24/2024  END OF SESSION:  PT End of Session - 01/24/24 1956     Visit Number 11    Number of Visits 25    Date for PT Re-Evaluation 02/28/24    Authorization Type [place] MEDICAID [place] COMPLETE HEALTH reporting period from 01/03/2024    Authorization - Number of Visits --    Progress Note Due on Visit 20    PT Start Time 1737    PT Stop Time 1815    PT Time Calculation (min) 38 min    Activity Tolerance Patient limited by pain;Patient tolerated treatment well    Behavior During Therapy WFL for tasks assessed/performed             Past Medical History:  Diagnosis Date   Allergic rhinitis    Angio-edema    Cervical radiculopathy    Chronic sinusitis    COVID-19    Elevated liver enzymes    Fatty liver disease, nonalcoholic    GERD (gastroesophageal reflux disease)    Peroneal tendonitis, left    Plantar fasciitis    Situational depression    Trigger finger of left  hand    Type 2 diabetes mellitus (HCC)    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    Past Surgical History:  Procedure Laterality Date   ANTERIOR CERVICAL DECOMP/DISCECTOMY FUSION N/A 04/25/2023   Procedure: C6-7 ANTERIOR CERVICAL DISCECTOMY AND FUSION (GLOBUS FORGE);  Surgeon: Carroll Clamp, MD;  Location: [facility] ORS;  Service: Neurosurgery;  Laterality: N/A;   BREAST EXCISIONAL BIOPSY Left 2004-2005   benign   BREAST SURGERY     CESAREAN SECTION  2006   CHOLECYSTECTOMY     COLONOSCOPY N/A 12/28/2023   Procedure: COLONOSCOPY;  Surgeon: Quintin Buckle, DO;  Location: [facility] ENDOSCOPY;  Service: Gastroenterology;  Laterality: N/A;  DM   ETHMOIDECTOMY Left 05/24/2022   Procedure: ETHMOIDECTOMY- TOTAL WITH FRONTAL SINUS EXPLORATION;  Surgeon: Von Grumbling, MD;  Location: [facility];  Service: ENT;  Laterality: Left;  Diabetic   GALLBLADDER SURGERY     IMAGE GUIDED SINUS  SURGERY Left 05/24/2022   Procedure: IMAGE GUIDED SINUS SURGERY;  Surgeon: Von Grumbling, MD;  Location: [facility];  Service: ENT;  Laterality: Left;  PLACED DISK ON OR CHARGE NURSE DESK 8-23 KP   MAXILLARY ANTROSTOMY Left 05/24/2022   Procedure: MAXILLARY ANTROSTOMY WITH TISSUE REMOVAL;  Surgeon: Von Grumbling, MD;  Location: [facility];  Service: ENT;  Laterality: Left;   POLYPECTOMY  12/28/2023   Procedure: POLYPECTOMY, INTESTINE;  Surgeon: Quintin Buckle, DO;  Location: [facility] ENDOSCOPY;  Service: Gastroenterology;;   Patient Active Problem List   Diagnosis Date Noted   S/P cervical spinal fusion 04/25/2023   Cervical disc disorder with radiculopathy of cervical region 04/05/2023   Left arm weakness 04/05/2023    PCP: Naaman Au, NP  REFERRING PROVIDER: Henderson Lock, MD (neurosurgery)   REFERRING DIAG: Post surgical neck pain   THERAPY DIAG:  Cervicalgia  Chronic thoracic spine pain  Muscle weakness (generalized)  Rationale for Evaluation and Treatment: rehabilitation  ONSET DATE: April 24, 2024  SUBJECTIVE:  PERTINENT HISTORY:  Tonya Mueller is a 58yoF s/p C6-7 ACDF referred to OPPT from neurosurgical due to ongoing pain following surgery, inability to resume baseline functional use of BUE for return to work duties. ACDF in September 2024, FU with doctor in November noted radicular pain, has since improved but now pt has neck stiffness, Right posterolateral neck pain, and periscapular pain. EMG showed a chronic but not active radiculopathy Left. MRI showed some improvement in decompression. Pt referred to PT and pain management due to ongoing pain issues and need for conservative management. Dr. Felipe Horton has not ruled out facet referral pain as a factor.  Patient has been out of work since June 2024.   Exercise history: was active before. Has an elliptical machine at home that she used to use regularly.   Screen for contraindications for Blood Flow Restriction Training (BFRT):  Acidosis (no) Cancer (no) Extremities with a dialysis port (no) Excessive swelling in post-surgical limb (ex. Lymphedema) Infection within the extremity (no) Increased ICP (no) Impaired circulation, or previous revascularization of the limb (no) Open fracture or wound (no) Pregnancy (no) Any history or risk factors for DVT of PE, including hormone replacement therapy (no), no blood clots Severe HTN (no) Severe crush injury (no)  Positive Precautions for BFRT:  Diabetic Chronic L radiculopathy in target limb    SUBJECTIVE STATEMENT: Patients states her neck pain is better since she had a cortisone injection on 5/27, but her left arm is the same as before.   PAIN:  NPRS: 0/10 pain at rest and up to 1-2/10 at right side of neck and sharp to ear occasionally but better since the injection when she turns to the right, left UE paresthesia (constant) with pain at lateral arm when she does something. She also feels like her throat has a lump at the right side (constant).     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 SUPINE  Cervical spine distraction 5x45 seconds Increased tingling L UE  Therapeutic exercise: therapeutic exercises  that incorporate ONE parameter at one or more areas of the body to centralize symptoms, develop strength and endurance, range of motion, and flexibility required for successful completion of functional activities.  Standing B scaption/flexion 3x15 with 2#DB  Increased pain/paresthesia in left UE  Standing bicep curl (single arm) 3x30 each side with 2#DB Increased pain/paresthesia in left UE, calms but still lingers with rest  Education about BFRT  Therapeutic activities:  dynamic therapeutic activities incorporating MULTIPLE parameters or areas of the body designed to achieve improved functional performance.  KB suitcase carry 3x129 feet holding 10#KB in each side Difficulty with holding 10#KB on left with need to re-adjust so as not to drop it Increased L UE pain and paresthesia   Pt required multimodal cuing for proper technique and to facilitate improved neuromuscular control, strength, range of motion, and functional ability resulting in improved performance and form.     PATIENT EDUCATION:  Education details: Exercise purpose/form. Self management techniques. Person educated: Patient Education method: Explanation, Demonstration, Tactile cues, Verbal cues, and Handouts Education comprehension: verbalized understanding, returned demonstration, and needs further education  HOME EXERCISE PROGRAM: Access Code: [email or web address] URL: [email or web address] Date: 12/05/2023 Prepared by: Alleen Isle  Exercises - Supine Cervical Retraction with Towel  - 3 x daily - 1 sets - 10 reps - 3sec hold - Forearm Walks on Wall with Resistance Band  - 1 x daily - 2-3 sets - 10 reps - Shoulder External Rotation and Scapular Retraction with Resistance  - 1 x daily - 3 sets - 15 reps - Scapular Protraction at Wall  - 1 x daily - 2-3 sets - 20 reps  HOME EXERCISE PROGRAM [[id number]]  View at [email or web address] code [email or web address] Suboccipital Stretch - (repeated upper cervical flexion nod with  OP)  ASSESSMENT:  CLINICAL IMPRESSION: Continued with POC with focus on strengthening L UE. Pateint faitgues very quickly and has increased pain/paresthesia with L UE use even with light weights. Dropped 2#DB briefly during bicep curls. Pateint may benefit from trial of blood flow restriction training in the L UE due to decreased tolerance for load. She does not have any absolute contraindications to this but does have relative precautions of diabetes and chronic L UE radiculopathy. Patient expects to need gabapentin  tonight due to increased pain in L UE. Manual traction did not help L UE radiculopathy symptoms and actually increased tingling sensation.  Patient would benefit from continued management of limiting condition by skilled physical therapist to address remaining impairments and functional limitations to work towards stated goals and return to PLOF or maximal functional independence.   From initial PT evaluation on 11/09/2023:  58yoF presenting with ongoing neck pain s/p ADCF 6 months prior, is unable to tolerate head movements required for ADL/IADL,driving and has been unable to resume moderate to heavy lifting for return to work. Exam revealing of restricted ROM in cervical spine. Patient will benefit from skilled physical therapy intervention to reduce deficits and impairments identified in evaluation, in order to reduce pain, improve quality of life, and maximize activity tolerance for ADL, IADL, and leisure/fitness. Physical therapy will help pt achieve long and short term goals of care.     OBJECTIVE IMPAIRMENTS: decreased activity tolerance, decreased ROM, and pain.   ACTIVITY LIMITATIONS: carrying, lifting, bending, sitting, bathing, dressing, and reach over head  PARTICIPATION LIMITATIONS: meal prep, cleaning, laundry, interpersonal relationship, driving, shopping, community activity, occupation, and yard work  PERSONAL FACTORS: Age, Fitness, Past/current experiences, and Time  since onset of injury/illness/exacerbation are also affecting patient's functional outcome.   REHAB POTENTIAL: Excellent  CLINICAL DECISION MAKING: Evolving/moderate complexity  EVALUATION COMPLEXITY: Moderate   GOALS: Goals reviewed with patient? No  SHORT TERM GOALS: Target date: 01/31/24  Improved cervical rotation ROM by >12 degrees bilat Baseline: ~50 degrees bilat; 01/03/24: 50/65 R/L Goal status: Partially met  2.  Tolerance of RDL lift ~20lb x20 reps without increased pain >2 points NPRS.  Baseline: deferred due to  time Goal status: In progress  3.  Regular performance of HEP for cervical ROM improvement and BUE loading.  Baseline: 01/03/24: 100% compliance  Goal status: MET  LONG TERM GOALS: Target date: 02/28/24  Pt to demonstrate cervical rotation ROM>70 degrees bilat and combined lumbar+thoracic+cervical rotation >100 degrees bilat Baseline: 01/03/24: 50/65 degrees cervical rotation. Goal status: In progress  2.  Pt to demonstrate ability to lift 40lb from table height and carry x143ft in preparation for return to work related duties.  Baseline: 01/03/24: 17' significant difficulty being able to hold a 40# box only able to ambulate ~39' with regular L hand grip slipping off the box needing to use anterior L thigh to assist holding box up ultimately needed PT assist to prevent dropping box Goal status: In progress  3.  Pt to report successful performance of all home-care tasks (activity modifications as needed) without pain exacerbation >1/10.   Baseline: 01/03/24: 3/10 NPS.  Goal status: In progress  4.  Pt to report readiness for return to work and to improve NDI by >30%.  Baseline: NDI: 50% on 11/09/23; 01/03/24: 38% Goal status: In progress  5. Pt will improve LUE grip strength to age and sex matched norms to assist in return to prior heavy lifting work duties.   Baseline: RUE: 53.33 lbs; LUE: 21.6 lbs  Goal status: Initial  PLAN:  PT FREQUENCY: 1-2x/week  PT  DURATION: 8 weeks  PLANNED INTERVENTIONS: 97110-Therapeutic exercises, 97530- Therapeutic activity, [id number]- Neuromuscular re-education, 97535- Self Care, 16109- Manual therapy, [id number]- Electrical stimulation (unattended), [phone]- Electrical stimulation (manual), Patient/Family education, Dry Needling, Spinal mobilization, Scar mobilization, DME instructions, Cryotherapy, and Moist heat  PLAN FOR NEXT SESSION:  Update HEP as appropriate, manual therapy and stretching as needed, progressive postural/UE/functional motor control/strengthening/ROM/stretch as appropriate. Consider BFRT  Carilyn Charles. Artemio Larry, PT, DPT 01/24/24, 8:00 PM  [facility] [address] [place], [place] [address] P: [phone] I F: [phone]

## 2024-01-30 ENCOUNTER — Ambulatory Visit: Admitting: Physical Therapy

## 2024-01-30 ENCOUNTER — Encounter: Payer: Self-pay | Admitting: Physical Therapy

## 2024-01-30 VITALS — BP 141/84 | HR 96

## 2024-01-30 DIAGNOSIS — G8929 Other chronic pain: Secondary | ICD-10-CM

## 2024-01-30 DIAGNOSIS — M6281 Muscle weakness (generalized): Secondary | ICD-10-CM

## 2024-01-30 DIAGNOSIS — M542 Cervicalgia: Secondary | ICD-10-CM | POA: Diagnosis not present

## 2024-01-30 NOTE — Therapy (Signed)
 OUTPATIENT PHYSICAL THERAPY TREATMENT   Patient Name: Tonya Mueller MRN: [id number] DOB:November 25, 1965, 58 y.o., female Today's Date: 01/30/2024  END OF SESSION:  PT End of Session - 01/30/24 1312     Visit Number 12    Number of Visits 25    Date for PT Re-Evaluation 02/28/24    Authorization Type [place] [employer]ICA[employer] [place] [employer] reporting period from 01/03/2024    Authorization Time Period CC [id number] for 12 PT vsts from 6/2-7/10    Authorization - Visit Number 2    Authorization - Number of Visits 12    Progress Note Due on Visit 20    PT Start Time 1301    PT Stop Time 1344    PT Time Calculation (min) 43 min    Activity Tolerance Patient limited by pain    Behavior During Therapy WFL for tasks assessed/performed              Past Medical History:  Diagnosis Date   Allergic rhinitis    Angio-edema    Cervical radiculopathy    Chronic sinusitis    COVID-19    Elevated liver enzymes    Fatty liver disease, nonalcoholic    GERD (gastroesophageal reflux disease)    Peroneal tendonitis, left    Plantar fasciitis    Situational depression    Trigger finger of left  hand    Type 2 diabetes mellitus (HCC)    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    Past Surgical History:  Procedure Laterality Date   ANTERIOR CERVICAL DECOMP/DISCECTOMY FUSION N/A 04/25/2023   Procedure: C6-7 ANTERIOR CERVICAL DISCECTOMY AND FUSION (GLOBUS FORGE);  Surgeon: Carroll Clamp, MD;  Location: [facility] ORS;  Service: Neurosurgery;  Laterality: N/A;   BREAST EXCISIONAL BIOPSY Left 2004-2005   benign   BREAST SURGERY     CESAREAN SECTION  2006   CHOLECYSTECTOMY     COLONOSCOPY N/A 12/28/2023   Procedure: COLONOSCOPY;  Surgeon: Quintin Buckle, DO;  Location: [facility] END[facility]COPY;  Service: Gastroenterology;  Laterality: N/A;  DM   ETHMOIDECTOMY Left 05/24/2022   Procedure: ETHMOIDECTOMY- TOTAL WITH FRONTAL SINUS EXPLORATION;  Surgeon: Von Grumbling, MD;  Location: [facility];   Service: ENT;  Laterality: Left;  Diabetic   GALLBLADDER SURGERY     IMAGE GUIDED SINUS SURGERY Left 05/24/2022   Procedure: IMAGE GUIDED SINUS SURGERY;  Surgeon: Von Grumbling, MD;  Location: [facility];  Service: ENT;  Laterality: Left;  PLACED DISK ON OR CHARGE NURSE DESK 8-23 KP   MAXILLARY ANTROSTOMY Left 05/24/2022   Procedure: MAXILLARY ANTROSTOMY WITH TISSUE REMOVAL;  Surgeon: Von Grumbling, MD;  Location: [facility];  Service: ENT;  Laterality: Left;   POLYPECTOMY  12/28/2023   Procedure: POLYPECTOMY, INTESTINE;  Surgeon: Quintin Buckle, DO;  Location: [facility];  Service: Gastroenterology;;   Patient Active Problem List   Diagnosis Date Noted   S/P cervical spinal fusion 04/25/2023   Cervical disc disorder with radiculopathy of cervical region 04/05/2023   Left arm weakness 04/05/2023    PCP: Naaman Au, NP  REFERRING PROVIDER: Henderson Lock, MD (neurosurgery)   REFERRING DIAG: Post surgical neck pain   THERAPY DIAG:  Cervicalgia  Chronic thoracic spine pain  Muscle weakness (generalized)  Rationale for Evaluation and Treatment: rehabilitation  ONSET DATE: April 24, 2024  SUBJECTIVE:  PERTINENT HISTORY:  Tonya Mueller is a 58yoF s/p C6-7 ACDF referred to OPPT from neurosurgical due to ongoing pain following surgery, inability to resume baseline functional use of BUE for return to work duties. ACDF in September 2024, FU with doctor in November noted radicular pain, has since improved but now pt has neck stiffness, Right posterolateral neck pain, and periscapular pain. EMG showed a chronic but not active radiculopathy Left. MRI showed some improvement in decompression. Pt referred to PT and pain management due to ongoing pain issues and need  for conservative management. Dr. Felipe Horton has not ruled out facet referral pain as a factor. Patient has been out of work since June 2024.   Exercise history: was active before. Has an elliptical machine at home that she used to use regularly.   Screen for contraindications for Blood Flow Restriction Training (BFRT):  Acidosis (no) Cancer (no) Extremities with a dialysis port (no) Excessive swelling in post-surgical limb (ex. Lymphedema) Infection within the extremity (no) Increased ICP (no) Impaired circulation, or previous revascularization of the limb (no) Open fracture or wound (no) Pregnancy (no) Any history or risk factors for DVT of PE, including hormone replacement therapy (no), no blood clots Severe HTN (no) Severe crush injury (no)  Positive Precautions for BFRT:  Diabetic Chronic L radiculopathy in target limb    SUBJECTIVE STATEMENT: Patients states she has more pain in her left arm that has been worse since last PT session when she did strengthening exercises. She has been doing her HEP. She had several appointments today. She now has pain in her left low back. She is getting an MRI for the low back. She had an appointment today to check her after the injection in her upper back last week. The injection seems to have helped her thoracic spine pain. She saw the neurologist today for the concussion. He had referred her to PT for cervical radiculopathy as well (order reviewed and it is for occupational therapy, the associated clinical note shows Dr. Mason Sole wanted OT for R elbow pain; patient states she no longer has R elbow pain). After being educated about it, patient agrees she would like to try BFRT for the left arm despite risk of exacerbating her nerve pain slightly.   PAIN:  NPRS: 0/10 pain at rest and up to 2/10 at right side of neck and sharp to ear occasionally but better since the injection when she turns to the right, left UE paresthesia (constant) with 3/10 pain at  lateral arm when she does something. She also feels like her throat has a lump at the right side (constant).     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    TREATMENT  Therapeutic exercise: therapeutic exercises that incorporate ONE parameter at one or more areas of the body to centralize symptoms, develop strength and endurance, range of motion, and flexibility required for successful completion of functional activities.  Vitals check prior to BFRT  Blood Flow Restriction Training (BFRT)   Initial Treatment: Screening:  Patient denies the following contraindications: acidosis, cancer, extremities with a dialysis port, excessive swelling in post-surgical limb (ex. lymphedema), infection within the extremity, increased intracranial pressure, impaired circulation or previous revascularization of the limb (vascular graft), open fracture or wound, any history of risk factors for DVT or PE, including hormone replacement therapy, severe hypertension, severe crush injury, and sickle cell anemia Education: Pt instructed on BFRT rationale, procedures, and possible side effects (including bruising, temporary numbness/tingling). Pt instructed to expect possible mild to moderate muscle soreness over the 2-3 following days (DOMS response). Pt instructed in methods to reduce muscle soreness. Pt instructed to continue prescribed HEP as instructed by PT. Patient verbalized understanding of these instructions and education and verbally consented to treatment.    Patient Verbal Consent Given: Yes   Cuff type: SmartCuffs 4.0 Cuff size: medium Cuff location: left arm LOP: 110 mmHg Exercise: standing  bicep curls AROM % LOP / mmHg utilized during exercises: 50 % / 55 mmHg Reps/Sets: 1x30, 3x15 with 30 second rest between sets. Tempo: 2-0-2 seconds Response: able to complete all reps, RPE up to unsure, 8/10 pain, skin within normal limits to visual inspection before and after. Her hand went numb and she felt pain down her entire left arm distal to the cuff and with last repetitions over the left upper trap. Gradually returning to baseline after removing cuff. Reports the pain is worse than her usual pain with PT.   Seated cervical retraction against small blue ball 1x10  Painful except when she flexes her neck a certain way  Standing B scaption/flexion 3x15 with 2#DB  Increased pain/paresthesia in left UE  Standing bicep curl (single arm) 3x30 each side with 2#DB Increased pain/paresthesia in left UE, calms but still lingers with rest  Manual therapy: to reduce pain and tissue tension, improve range of motion, neuromodulation, in order to promote improved ability to complete functional activities. SEATED STM to bilateral upper traps, cervical paraspinals, and suboccipital muscles   Pt required multimodal cuing for proper technique and to facilitate improved neuromuscular control, strength, range of motion, and functional ability resulting in improved performance and form.     PATIENT EDUCATION:  Education details: Exercise purpose/form. Self management techniques. Person educated: Patient Education method: Explanation, Demonstration, Tactile cues, Verbal cues, and Handouts Education comprehension: verbalized understanding, returned demonstration, and needs further education  HOME EXERCISE PROGRAM: Access Code: [email or web address] URL: [email or web address] Date: 12/05/2023 Prepared by: Alleen Isle  Exercises - Supine Cervical  Retraction with Towel  - 3 x daily - 1 sets - 10 reps - 3sec hold - Forearm Walks on Wall with Resistance Band  - 1 x daily - 2-3 sets - 10 reps - Shoulder External Rotation and Scapular Retraction with Resistance  - 1 x daily - 3 sets - 15 reps - Scapular Protraction at Wall  - 1 x daily - 2-3 sets - 20 reps  HOME EXERCISE PROGRAM [[id number]]  View at [email or web address] code [id number] Suboccipital Stretch - (repeated upper cervical flexion nod with OP)  ASSESSMENT:  CLINICAL IMPRESSION: Attempted BFRT this session the left UE using AROM bicep curl. It significantly increased patients left radicular symptoms, despite the cuff not being on the original compression  site. Patient also with shooting pain down left arm with neck extension. She had pain elevated to 8/10 during BFRT with numbness of left hand. After removing cuff pain came down to 5/10 over left upper trap to elbow and hand is more numb than when she arrived but not as numb as during BFRT. Plan do discontinue this modality due to intolerance. Patient has not tolerated her last two PT visits well, stating when she does not come to PT she is limited in what she can do but her pain is more manageable. She found BFRT less comfortable than loaded strengthening of her left UE. May have limited rehab potential without significant recovery of nerve function. Patient would benefit from continued management of limiting condition by skilled physical therapist to address remaining impairments and functional limitations to work towards stated goals and return to PLOF or maximal functional independence.    From initial PT evaluation on 11/09/2023:  58yoF presenting with ongoing neck pain s/p ADCF 6 months prior, is unable to tolerate head movements required for ADL/IADL,driving and has been unable to resume moderate to heavy lifting for return to work. Exam revealing of restricted ROM in cervical spine. Patient will benefit from skilled physical therapy  intervention to reduce deficits and impairments identified in evaluation, in order to reduce pain, improve quality of life, and maximize activity tolerance for ADL, IADL, and leisure/fitness. Physical therapy will help pt achieve long and short term goals of care.     OBJECTIVE IMPAIRMENTS: decreased activity tolerance, decreased ROM, and pain.   ACTIVITY LIMITATIONS: carrying, lifting, bending, sitting, bathing, dressing, and reach over head  PARTICIPATION LIMITATIONS: meal prep, cleaning, laundry, interpersonal relationship, driving, shopping, community activity, occupation, and yard work  PERSONAL FACTORS: Age, Fitness, Past/current experiences, and Time since onset of injury/illness/exacerbation are also affecting patient's functional outcome.   REHAB POTENTIAL: Excellent  CLINICAL DECISION MAKING: Evolving/moderate complexity  EVALUATION COMPLEXITY: Moderate   GOALS: Goals reviewed with patient? No  SHORT TERM GOALS: Target date: 01/31/24  Improved cervical rotation ROM by >12 degrees bilat Baseline: ~50 degrees bilat; 01/03/24: 50/65 R/L Goal status: Partially met  2.  Tolerance of RDL lift ~20lb x20 reps without increased pain >2 points NPRS.  Baseline: deferred due to time Goal status: In progress  3.  Regular performance of HEP for cervical ROM improvement and BUE loading.  Baseline: 01/03/24: 100% compliance  Goal status: MET  LONG TERM GOALS: Target date: 02/28/24  Pt to demonstrate cervical rotation ROM>70 degrees bilat and combined lumbar+thoracic+cervical rotation >100 degrees bilat Baseline: 01/03/24: 50/65 degrees cervical rotation. Goal status: In progress  2.  Pt to demonstrate ability to lift 40lb from table height and carry x143ft in preparation for return to work related duties.  Baseline: 01/03/24: 14' significant difficulty being able to hold a 40# box only able to ambulate ~39' with regular L hand grip slipping off the box needing to use anterior L thigh to  assist holding box up ultimately needed PT assist to prevent dropping box Goal status: In progress  3.  Pt to report successful performance of all home-care tasks (activity modifications as needed) without pain exacerbation >1/10.   Baseline: 01/03/24: 3/10 NPS.  Goal status: In progress  4.  Pt to report readiness for return to work and to improve NDI by >30%.  Baseline: NDI: 50% on 11/09/23; 01/03/24: 38% Goal status: In progress  5. Pt will improve LUE grip strength to age and sex matched norms to assist in return to  prior heavy lifting work duties.   Baseline: RUE: 53.33 lbs; LUE: 21.6 lbs  Goal status: Initial  PLAN:  PT FREQUENCY: 1-2x/week  PT DURATION: 8 weeks  PLANNED INTERVENTIONS: 97110-Therapeutic exercises, 97530- Therapeutic activity, [id number]- Neuromuscular re-education, 97535- Self Care, 16109- Manual therapy, [id number]- Electrical stimulation (unattended), [phone]- Electrical stimulation (manual), Patient/Family education, Dry Needling, Spinal mobilization, Scar mobilization, DME instructions, Cryotherapy, and Moist heat  PLAN FOR NEXT SESSION:  Update HEP as appropriate, manual therapy and stretching as needed, progressive postural/UE/functional motor control/strengthening/ROM/stretch as appropriate.   Carilyn Charles. Artemio Larry, PT, DPT 01/30/24, 2:57 PM  [facility] [address] [place], [place] [address] P: [phone] I F: [phone]

## 2024-02-01 ENCOUNTER — Ambulatory Visit: Admitting: Physical Therapy

## 2024-02-05 ENCOUNTER — Ambulatory Visit: Admitting: Physical Therapy

## 2024-02-05 DIAGNOSIS — M542 Cervicalgia: Secondary | ICD-10-CM | POA: Diagnosis not present

## 2024-02-05 DIAGNOSIS — G8929 Other chronic pain: Secondary | ICD-10-CM

## 2024-02-05 DIAGNOSIS — M6281 Muscle weakness (generalized): Secondary | ICD-10-CM

## 2024-02-05 NOTE — Therapy (Signed)
 OUTPATIENT PHYSICAL THERAPY TREATMENT   Patient Name: Tonya Mueller MRN: [id number] DOB:[birth year], 58 y.o., female Today's Date: 02/05/2024  END OF SESSION:  PT End of Session - 02/05/24 1808     Visit Number 13    Number of Visits 25    Date for PT Re-Evaluation 02/28/24    Authorization Type [place] MEDICAID [place] [employer] reporting period from 01/03/2024    Authorization Time Period CC [id number] for 12 PT vsts from 6/2-7/10    Authorization - Visit Number 3    Authorization - Number of Visits 12    Progress Note Due on Visit 20    PT Start Time 1518    PT Stop Time 1605    PT Time Calculation (min) 47 min    Activity Tolerance Patient limited by pain    Behavior During Therapy WFL for tasks assessed/performed            Past Medical History:  Diagnosis Date   Allergic rhinitis    Angio-edema    Cervical radiculopathy    Chronic sinusitis    COVID-19    Elevated liver enzymes    Fatty liver disease, nonalcoholic    GERD (gastroesophageal reflux disease)    Peroneal tendonitis, left    Plantar fasciitis    Situational depression    Trigger finger of left  hand    Type 2 diabetes mellitus (HCC)    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    Past Surgical History:  Procedure Laterality Date   ANTERIOR CERVICAL DECOMP/DISCECTOMY FUSION N/A 04/25/2023   Procedure: C6-7 ANTERIOR CERVICAL DISCECTOMY AND FUSION (GLOBUS FORGE);  Surgeon: Tonya Clamp, MD;  Location: [facility];  Service: Neurosurgery;  Laterality: N/A;   BREAST EXCISIONAL BIOPSY Left 2004-2005   benign   BREAST SURGERY     CESAREAN SECTION  2006   CHOLECYSTECTOMY     COLONOSCOPY N/A 12/28/2023   Procedure: COLONOSCOPY;  Surgeon: Tonya Buckle, DO;  Location: [facility];  Service: Gastroenterology;  Laterality: N/A;  DM   ETHMOIDECTOMY Left 05/24/2022   Procedure: ETHMOIDECTOMY- TOTAL WITH FRONTAL SINUS EXPLORATION;  Surgeon: Tonya Grumbling, MD;  Location: [facility];  Service:  ENT;  Laterality: Left;  Diabetic   GALLBLADDER SURGERY     IMAGE GUIDED SINUS SURGERY Left 05/24/2022   Procedure: IMAGE GUIDED SINUS SURGERY;  Surgeon: Tonya Grumbling, MD;  Location: [facility];  Service: ENT;  Laterality: Left;  PLACED DISK ON OR CHARGE NURSE DESK 8-23 KP   MAXILLARY ANTROSTOMY Left 05/24/2022   Procedure: MAXILLARY ANTROSTOMY WITH TISSUE REMOVAL;  Surgeon: Tonya Grumbling, MD;  Location: [facility];  Service: ENT;  Laterality: Left;   POLYPECTOMY  12/28/2023   Procedure: POLYPECTOMY, INTESTINE;  Surgeon: Tonya Buckle, DO;  Location: [facility] ENDOSCOPY;  Service: Gastroenterology;;   Patient Active Problem List   Diagnosis Date Noted   S/P cervical spinal fusion 04/25/2023   Cervical disc disorder with radiculopathy of cervical region 04/05/2023   Left arm weakness 04/05/2023    PCP: Tonya Au, NP  REFERRING PROVIDER: Henderson Lock, MD (neurosurgery)   REFERRING DIAG: Post surgical neck pain   THERAPY DIAG:  Cervicalgia  Chronic thoracic spine pain  Muscle weakness (generalized)  Rationale for Evaluation and Treatment: rehabilitation  ONSET DATE: April 24, 2024  SUBJECTIVE:  PERTINENT HISTORY:  Tonya Mueller is a 58yoF s/p C6-7 ACDF referred to OPPT from neurosurgical due to ongoing pain following surgery, inability to resume baseline functional use of BUE for return to work duties. ACDF in September 2024, FU with doctor in November noted radicular pain, has since improved but now pt has neck stiffness, Right posterolateral neck pain, and periscapular pain. EMG showed a chronic but not active radiculopathy Left. MRI showed some improvement in decompression. Pt referred to PT and pain management due to ongoing pain issues and need for  conservative management. Dr. Felipe Mueller has not ruled out facet referral pain as a factor. Patient has been out of work since June 2024.   Exercise history: was active before. Has an elliptical machine at home that she used to use regularly.   Patient states she originally hurt neck at work. She lifted a box from overhead and the box fell on her. She caught it over her L chest/shoulder. She continued working but started having pain down the left arm. She had a positive abduction sign and having her arm over her head was the only way to control the pain. She took a lot of different medications and went to physical therapy. After 3 months it was only a little better so she consulted with a surgeon who recommended surgery.     SUBJECTIVE STATEMENT: Patient states she is doing bad. She states her pain is now all the way down her back towards left glute from the CT junction. She has pain from the back of her left arm to the elbow, then numbness the rest of the way down to her hand and all the fingers. The index finger is the worst. It used to be mostly the index finger and a little in the thumb and middle finger. She states this got worse after the BFRT last visit. The left arm is not as bad as it was for about 2 days after last visit, but it is still worse than before that. When she turns her head to the left it worsens left sided pain around the periscapular. Patient states she has mostly been staying in the bed because moving hurts too much. Every time she tries to use her L arm it gets worse. Before her last PT visit, she states her left arm was really weak but now she just drops things.   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 She sees her PCP on March 01, 2024.    OBJECTIVE:     TREATMENT  Therapeutic exercise: therapeutic exercises that incorporate ONE parameter at one or more areas of the body to centralize symptoms, develop strength and endurance, range of motion, and flexibility required for successful completion of functional activities.  Education with visual aides and review of chart and past medical/surgical/test history to help patient understand her symptoms.   Manual therapy: to reduce pain and tissue tension, improve range of motion, neuromodulation, in order to promote improved ability to complete functional activities. HOOKLYING/SUPINE STM to L > R upper traps, levator scap, scalenes, soft tissue around the clavicle and along the brachial plexus.  Shooting pain into L UE with pressure near first rib just superior to clavicle.  Gentle manual traction intermittent (discontinued as it increased L UE sympotms).  Gentle CPA/palpation to lower cervical and upper thoracic segments to assess response (painful at C6-C7).   PATIENT EDUCATION:  Education details: Exercise purpose/form. Self management techniques. Person educated: Patient Education method: Explanation, Demonstration, Tactile cues, Verbal cues, and Handouts Education comprehension: verbalized understanding, returned demonstration, and needs further education  HOME EXERCISE PROGRAM: Access Code: [email or web address] URL: [email or web address] Date: 12/05/2023 Prepared by: Alleen Isle  Exercises - Supine Cervical Retraction with Towel  - 3 x daily - 1 sets - 10 reps - 3sec hold - Forearm Walks on Wall with Resistance Band  - 1 x daily - 2-3 sets - 10 reps - Shoulder External Rotation and Scapular Retraction with Resistance  - 1 x daily - 3 sets - 15 reps - Scapular Protraction at Wall  - 1 x  daily - 2-3 sets - 20 reps  HOME EXERCISE PROGRAM [[id number]]  View at [email or web address] code [id number] Suboccipital Stretch - (repeated upper cervical flexion nod with OP)  ASSESSMENT:  CLINICAL IMPRESSION: Pateint with very poor response to blood flow restriction training (BFRT) last PT session with worsened pain and paresthesia in the left UE since then. Patient got some mild relief from manual therapy today, but did not participate in exercises due all movements/exercises exacerbating her symptoms. She also was educated and questions were answered within PT scope about why she is continuing to have pain, weakness, and paresthesia in the L UE in the C7 distribution. By the end of the session, her left hand symptoms in the 4th and 5th digits had improved. She had a lot of paresthesia in the left UE during STM to left posterior cervical spine musculature and over brachial plexus near the left scalenes and clavicle (sharpest with mobilization of soft tissue near first rib near superior edge of clavicle. Patient continues to demonstrate poor tolerance for strengthening exercises, yet remains weak due to neurogenic weakness with deconditioning layered on (due prolonged disuse because of radicular symptoms and neurogenic weakness). Plan to continue to attempt strengthening as tolerated next session and consider referring back to physician if she continues to lack tolerance for strengthening. Patient appears confused as to which provider to turn to with her symptoms not improving, stating Dr. Felipe Mueller dismissed her from his care since the surgery was completed. Patient would benefit from continued management of limiting condition by skilled physical therapist to address remaining impairments and functional limitations to work towards stated goals and return to PLOF or maximal functional independence.     From initial PT evaluation on 11/09/2023:  58yoF presenting with ongoing neck pain s/p ADCF 6 months prior,  is unable to tolerate head movements required for ADL/IADL,driving and has been unable to resume moderate to heavy lifting for return to work. Exam revealing of restricted ROM in  cervical spine. Patient will benefit from skilled physical therapy intervention to reduce deficits and impairments identified in evaluation, in order to reduce pain, improve quality of life, and maximize activity tolerance for ADL, IADL, and leisure/fitness. Physical therapy will help pt achieve long and short term goals of care.     OBJECTIVE IMPAIRMENTS: decreased activity tolerance, decreased ROM, and pain.   ACTIVITY LIMITATIONS: carrying, lifting, bending, sitting, bathing, dressing, and reach over head  PARTICIPATION LIMITATIONS: meal prep, cleaning, laundry, interpersonal relationship, driving, shopping, community activity, occupation, and yard work  PERSONAL FACTORS: Age, Fitness, Past/current experiences, and Time since onset of injury/illness/exacerbation are also affecting patient's functional outcome.   REHAB POTENTIAL: Excellent  CLINICAL DECISION MAKING: Evolving/moderate complexity  EVALUATION COMPLEXITY: Moderate   GOALS: Goals reviewed with patient? No  SHORT TERM GOALS: Target date: 01/31/24  Improved cervical rotation ROM by >12 degrees bilat Baseline: ~50 degrees bilat; 01/03/24: 50/65 R/L Goal status: Partially met  2.  Tolerance of RDL lift ~20lb x20 reps without increased pain >2 points NPRS.  Baseline: deferred due to time Goal status: In progress  3.  Regular performance of HEP for cervical ROM improvement and BUE loading.  Baseline: 01/03/24: 100% compliance  Goal status: MET  LONG TERM GOALS: Target date: 02/28/24  Pt to demonstrate cervical rotation ROM>70 degrees bilat and combined lumbar+thoracic+cervical rotation >100 degrees bilat Baseline: 01/03/24: 50/65 degrees cervical rotation. Goal status: In progress  2.  Pt to demonstrate ability to lift 40lb from table height and  carry x197ft in preparation for return to work related duties.  Baseline: 01/03/24: 11' significant difficulty being able to hold a 40# box only able to ambulate ~39' with regular L hand grip slipping off the box needing to use anterior L thigh to assist holding box up ultimately needed PT assist to prevent dropping box Goal status: In progress  3.  Pt to report successful performance of all home-care tasks (activity modifications as needed) without pain exacerbation >1/10.   Baseline: 01/03/24: 3/10 NPS.  Goal status: In progress  4.  Pt to report readiness for return to work and to improve NDI by >30%.  Baseline: NDI: 50% on 11/09/23; 01/03/24: 38% Goal status: In progress  5. Pt will improve LUE grip strength to age and sex matched norms to assist in return to prior heavy lifting work duties.   Baseline: RUE: 53.33 lbs; LUE: 21.6 lbs  Goal status: Initial  PLAN:  PT FREQUENCY: 1-2x/week  PT DURATION: 8 weeks  PLANNED INTERVENTIONS: 97110-Therapeutic exercises, 97530- Therapeutic activity, [id number]- Neuromuscular re-education, 97535- Self Care, [id number]- Manual therapy, [id number]- Electrical stimulation (unattended), [phone]- Electrical stimulation (manual), Patient/Family education, Dry Needling, Spinal mobilization, Scar mobilization, DME instructions, Cryotherapy, and Moist heat  PLAN FOR NEXT SESSION:  Update HEP as appropriate, manual therapy and stretching as needed, progressive postural/UE/functional motor control/strengthening/ROM/stretch as appropriate.   Carilyn Charles. Artemio Larry, PT, DPT 02/05/24, 6:19 PM  [facility] [address] [place], [place] [address] P: [phone] I F: [phone]

## 2024-02-06 ENCOUNTER — Ambulatory Visit: Admitting: Physical Therapy

## 2024-02-08 ENCOUNTER — Ambulatory Visit: Admitting: Physical Therapy

## 2024-02-13 ENCOUNTER — Ambulatory Visit: Admitting: Physical Therapy

## 2024-02-13 ENCOUNTER — Encounter: Payer: Self-pay | Admitting: Physical Therapy

## 2024-02-13 DIAGNOSIS — M542 Cervicalgia: Secondary | ICD-10-CM

## 2024-02-13 DIAGNOSIS — M6281 Muscle weakness (generalized): Secondary | ICD-10-CM

## 2024-02-13 DIAGNOSIS — G8929 Other chronic pain: Secondary | ICD-10-CM

## 2024-02-13 NOTE — Therapy (Signed)
 OUTPATIENT PHYSICAL THERAPY TREATMENT   Patient Name: Tonya Mueller MRN: [id number] DOB:Dec 14, 1965, 58 y.o., female Today's Date: 02/13/2024  END OF SESSION:  PT End of Session - 02/13/24 1601     Visit Number 14    Number of Visits 25    Date for PT Re-Evaluation 02/28/24    Authorization Type Lane MEDICAID [place] [employer] reporting period from 01/03/2024    Authorization Time Period CC [id number] for 12 PT vsts from 6/2-7/10    Authorization - Visit Number 4    Authorization - Number of Visits 12    Progress Note Due on Visit 20    PT Start Time 1521    PT Stop Time 1601    PT Time Calculation (min) 40 min    Activity Tolerance Patient limited by pain    Behavior During Therapy WFL for tasks assessed/performed             Past Medical History:  Diagnosis Date   Allergic rhinitis    Angio-edema    Cervical radiculopathy    Chronic sinusitis    COVID-19    Elevated liver enzymes    Fatty liver disease, nonalcoholic    GERD (gastroesophageal reflux disease)    Peroneal tendonitis, left    Plantar fasciitis    Situational depression    Trigger finger of left  hand    Type 2 diabetes mellitus (HCC)    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    Past Surgical History:  Procedure Laterality Date   ANTERIOR CERVICAL DECOMP/DISCECTOMY FUSION N/A 04/25/2023   Procedure: C6-7 ANTERIOR CERVICAL DISCECTOMY AND FUSION (GLOBUS FORGE);  Surgeon: Claudene Penne ORN, MD;  Location: [facility] ORS;  Service: Neurosurgery;  Laterality: N/A;   BREAST EXCISIONAL BIOPSY Left 2004-2005   benign   BREAST SURGERY     CESAREAN SECTION  2006   CHOLECYSTECTOMY     COLONOSCOPY N/A 12/28/2023   Procedure: COLONOSCOPY;  Surgeon: Onita Elspeth Sharper, DO;  Location: [facility];  Service: Gastroenterology;  Laterality: N/A;  DM   ETHMOIDECTOMY Left 05/24/2022   Procedure: ETHMOIDECTOMY- TOTAL WITH FRONTAL SINUS EXPLORATION;  Surgeon: Blair Mt, MD;  Location: [facility];   Service: ENT;  Laterality: Left;  Diabetic   GALLBLADDER SURGERY     IMAGE GUIDED SINUS SURGERY Left 05/24/2022   Procedure: IMAGE GUIDED SINUS SURGERY;  Surgeon: Blair Mt, MD;  Location: [facility];  Service: ENT;  Laterality: Left;  PLACED DISK ON OR CHARGE NURSE DESK 8-23 KP   MAXILLARY ANTROSTOMY Left 05/24/2022   Procedure: MAXILLARY ANTROSTOMY WITH TISSUE REMOVAL;  Surgeon: Blair Mt, MD;  Location: [facility];  Service: ENT;  Laterality: Left;   POLYPECTOMY  12/28/2023   Procedure: POLYPECTOMY, INTESTINE;  Surgeon: Onita Elspeth Sharper, DO;  Location: [facility];  Service: Gastroenterology;;   Patient Active Problem List   Diagnosis Date Noted   S/P cervical spinal fusion 04/25/2023   Cervical disc disorder with radiculopathy of cervical region 04/05/2023   Left arm weakness 04/05/2023    PCP: Clotilda Leaven, NP  REFERRING PROVIDER: Penne Claudene, MD (neurosurgery)   REFERRING DIAG: Post surgical neck pain   THERAPY DIAG:  Cervicalgia  Chronic thoracic spine pain  Muscle weakness (generalized)  Rationale for Evaluation and Treatment: rehabilitation  ONSET DATE: April 24, 2024  SUBJECTIVE:  PERTINENT HISTORY:  Tonya Mueller is a 58yoF s/p C6-7 ACDF referred to OPPT from neurosurgical due to ongoing pain following surgery, inability to resume baseline functional use of BUE for return to work duties. ACDF in September 2024, FU with doctor in November noted radicular pain, has since improved but now pt has neck stiffness, Right posterolateral neck pain, and periscapular pain. EMG showed a chronic but not active radiculopathy Left. MRI showed some improvement in decompression. Pt referred to PT and pain management due to ongoing pain issues and need  for conservative management. Dr. Claudene has not ruled out facet referral pain as a factor. Patient has been out of work since June 2024.   Exercise history: was active before. Has an elliptical machine at home that she used to use regularly.   Patient states she originally hurt neck at work. She lifted a box from overhead and the box fell on her. She caught it over her L chest/shoulder. She continued working but started having pain down the left arm. She had a positive abduction sign and having her arm over her head was the only way to control the pain. She took a lot of different medications and went to physical therapy. After 3 months it was only a little better so she consulted with a surgeon who recommended surgery.     SUBJECTIVE STATEMENT: Patient states her the pain in her left arm is back to baseline from before she tried BFRT. She has an appointment with Dr. Claudene her surgeon tomorrow. She states she can walk one block holding the dog leash with the left hand, then she has to move it to the right hand. Her dog weighs 2.8#. She states she has gotten used to living with not using her left UE as much but she cannot use her left UE to be able to do even the easiest job at work. She can do some of the things 1-2 times, but cannot tolerated it repetitively.  She cannot make the bed by herself because she cannot pick up the mattress.   PAIN:  NPRS: 2/10 pain in the left upper arm when she rotates her hand right. She has constant paresthesia in her left UE to digits 1-3.      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 She sees her PCP on March 01, 2024.    OBJECTIVE:     TREATMENT                                                                                                                     Therapeutic exercise: therapeutic exercises that incorporate ONE parameter at one or more areas of the body to centralize symptoms, develop strength and endurance, range of motion, and flexibility required for successful completion of functional  activities.  Upper body ergometer level 10 to encourage joint nutrition, warm tissue, induce analgesic effect of aerobic exercise, improve muscular strength and endurance,  and prepare for remainder of session. 3 min forwards 2 minutes backwards  Superset:  Standing B scaption/flexion 3x15 with 2#DB  Increased pain/paresthesia in left UE   Standing bicep curl (single arm) 3x30 each side with 2#DB Increased pain/paresthesia in left UE, calms but still lingers with rest Superset:  Standing shoulder IR isometric against pilates ring 3x15 with 5 second squeezes   Standing B shoulder ER with palms up with theraband 2x15 with YTB hands Pt has increased pain when she retracts her scapulae so reomved this part of movement.   **L hand completely numb at this point**  L hand manipulation of x-soft putty and provided for pt to take home to practice to improve hand strength/dexterity  Seated L wrist/finger flexion with forearm supported using DB  1x12 with 1# DB  Therapeutic activities: dynamic therapeutic activities incorporating MULTIPLE parameters or areas of the body designed to achieve improved functional performance.   KB suitcase carry 3x100 feet holding 10#KB in each side Difficulty with holding 10#KB on left with need to re-adjust so as not to drop it Increased L UE pain and paresthesia  Pt required multimodal cuing for proper technique and to facilitate improved neuromuscular control, strength, range of motion, and functional ability resulting in improved performance and form.   PATIENT EDUCATION:  Education details: Exercise purpose/form. Self management techniques. Person educated:  Patient Education method: Explanation, Demonstration, Tactile cues, Verbal cues, and Handouts Education comprehension: verbalized understanding, returned demonstration, and needs further education  HOME EXERCISE PROGRAM: Access Code: [email or web address] URL: [email or web address] Date: 12/05/2023 Prepared by: Camie Cleverly  Exercises - Supine Cervical Retraction with Towel  - 3 x daily - 1 sets - 10 reps - 3sec hold - Forearm Walks on Wall with Resistance Band  - 1 x daily - 2-3 sets - 10 reps - Shoulder External Rotation and Scapular Retraction with Resistance  - 1 x daily - 3 sets - 15 reps - Scapular Protraction at Wall  - 1 x daily - 2-3 sets - 20 reps  HOME EXERCISE PROGRAM [[id number]]  View at [email or web address] code [id number] Suboccipital Stretch - (repeated upper cervical flexion nod with OP)  ASSESSMENT:  CLINICAL IMPRESSION: Patient appears to be recovered from flair in symptoms after BFRT 2 weeks ago. She tolerated today's session with less pain but still developed complete numbness of L UE with very light exercises. She was still able to do more today than she was before she underwent BFRT. She had more signs of weakness than pain today (except with scapular retractions which caused pain so was discontinued). Patient would benefit from continued management of limiting condition by skilled physical therapist to address remaining impairments and functional limitations to work towards stated goals and return to PLOF or maximal functional independence.   From initial PT evaluation on 11/09/2023:  58yoF presenting with ongoing neck pain s/p ADCF 6 months prior, is unable to tolerate head movements required for ADL/IADL,driving and has been unable to resume moderate to heavy lifting for return to work. Exam revealing of restricted ROM in cervical spine. Patient will benefit from skilled physical therapy intervention to reduce deficits and impairments identified in evaluation, in order  to reduce pain, improve quality of life, and maximize activity tolerance for ADL, IADL, and leisure/fitness. Physical therapy will help pt achieve long and short term goals of care.  OBJECTIVE IMPAIRMENTS: decreased activity tolerance, decreased ROM, and pain.   ACTIVITY LIMITATIONS: carrying, lifting, bending, sitting, bathing, dressing, and reach over head  PARTICIPATION LIMITATIONS: meal prep, cleaning, laundry, interpersonal relationship, driving, shopping, community activity, occupation, and yard work  PERSONAL FACTORS: Age, Fitness, Past/current experiences, and Time since onset of injury/illness/exacerbation are also affecting patient's functional outcome.   REHAB POTENTIAL: Excellent  CLINICAL DECISION MAKING: Evolving/moderate complexity  EVALUATION COMPLEXITY: Moderate   GOALS: Goals reviewed with patient? No  SHORT TERM GOALS: Target date: 01/31/24  Improved cervical rotation ROM by >12 degrees bilat Baseline: ~50 degrees bilat; 01/03/24: 50/65 R/L Goal status: Partially met  2.  Tolerance of RDL lift ~20lb x20 reps without increased pain >2 points NPRS.  Baseline: deferred due to time Goal status: In progress  3.  Regular performance of HEP for cervical ROM improvement and BUE loading.  Baseline: 01/03/24: 100% compliance  Goal status: MET  LONG TERM GOALS: Target date: 02/28/24  Pt to demonstrate cervical rotation ROM>70 degrees bilat and combined lumbar+thoracic+cervical rotation >100 degrees bilat Baseline: 01/03/24: 50/65 degrees cervical rotation. Goal status: In progress  2.  Pt to demonstrate ability to lift 40lb from table height and carry x182ft in preparation for return to work related duties.  Baseline: 01/03/24: 17' significant difficulty being able to hold a 40# box only able to ambulate ~39' with regular L hand grip slipping off the box needing to use anterior L thigh to assist holding box up ultimately needed PT assist to prevent dropping box Goal  status: In progress  3.  Pt to report successful performance of all home-care tasks (activity modifications as needed) without pain exacerbation >1/10.   Baseline: 01/03/24: 3/10 NPS.  Goal status: In progress  4.  Pt to report readiness for return to work and to improve NDI by >30%.  Baseline: NDI: 50% on 11/09/23; 01/03/24: 38% Goal status: In progress  5. Pt will improve LUE grip strength to age and sex matched norms to assist in return to prior heavy lifting work duties.   Baseline: RUE: 53.33 lbs; LUE: 21.6 lbs  Goal status: Initial  PLAN:  PT FREQUENCY: 1-2x/week  PT DURATION: 8 weeks  PLANNED INTERVENTIONS: 97110-Therapeutic exercises, 97530- Therapeutic activity, [id number]- Neuromuscular re-education, 97535- Self Care, [id number]- Manual therapy, [id number]- Electrical stimulation (unattended), [phone]- Electrical stimulation (manual), Patient/Family education, Dry Needling, Spinal mobilization, Scar mobilization, DME instructions, Cryotherapy, and Moist heat  PLAN FOR NEXT SESSION:  Update HEP as appropriate, manual therapy and stretching as needed, progressive postural/UE/functional motor control/strengthening/ROM/stretch as appropriate.   Camie SAUNDERS. Juli, PT, DPT 02/13/24, 4:27 PM  [facility] [address] [place], [place] [address] P: [phone] I F: [phone]

## 2024-02-14 ENCOUNTER — Ambulatory Visit: Admitting: Neurosurgery

## 2024-02-14 ENCOUNTER — Encounter: Payer: Self-pay | Admitting: Neurosurgery

## 2024-02-14 VITALS — BP 126/78 | Ht 61.0 in | Wt 223.0 lb

## 2024-02-14 DIAGNOSIS — M5412 Radiculopathy, cervical region: Secondary | ICD-10-CM

## 2024-02-14 DIAGNOSIS — R29898 Other symptoms and signs involving the musculoskeletal system: Secondary | ICD-10-CM

## 2024-02-14 DIAGNOSIS — Z981 Arthrodesis status: Secondary | ICD-10-CM | POA: Diagnosis not present

## 2024-02-15 ENCOUNTER — Encounter: Payer: Self-pay | Admitting: Physical Therapy

## 2024-02-15 ENCOUNTER — Ambulatory Visit: Admitting: Physical Therapy

## 2024-02-15 DIAGNOSIS — M542 Cervicalgia: Secondary | ICD-10-CM

## 2024-02-15 DIAGNOSIS — G8929 Other chronic pain: Secondary | ICD-10-CM

## 2024-02-15 DIAGNOSIS — M6281 Muscle weakness (generalized): Secondary | ICD-10-CM

## 2024-02-15 NOTE — Therapy (Signed)
 OUTPATIENT PHYSICAL THERAPY TREATMENT   Patient Name: Tonya Mueller MRN: [id number] DOB:17-Jan-1966, 58 y.o., female Today's Date: 02/15/2024  END OF SESSION:  PT End of Session - 02/15/24 1529     Visit Number 15    Number of Visits 25    Date for PT Re-Evaluation 02/28/24    Authorization Type [place] MEDICAID [place] COMPLETE HEALTH reporting period from 01/03/2024    Authorization Time Period CC [id number] for 12 PT vsts from 6/2-7/10    Authorization - Visit Number 5    Authorization - Number of Visits 12    Progress Note Due on Visit 20    PT Start Time 1522    PT Stop Time 1600    PT Time Calculation (min) 38 min    Activity Tolerance Patient limited by pain    Behavior During Therapy WFL for tasks assessed/performed              Past Medical History:  Diagnosis Date   Allergic rhinitis    Angio-edema    Cervical radiculopathy    Chronic sinusitis    COVID-19    Elevated liver enzymes    Fatty liver disease, nonalcoholic    GERD (gastroesophageal reflux disease)    Peroneal tendonitis, left    Plantar fasciitis    Situational depression    Trigger finger of left  hand    Type 2 diabetes mellitus (HCC)    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    Past Surgical History:  Procedure Laterality Date   ANTERIOR CERVICAL DECOMP/DISCECTOMY FUSION N/A 04/25/2023   Procedure: C6-7 ANTERIOR CERVICAL DISCECTOMY AND FUSION (GLOBUS FORGE);  Surgeon: Tonya Mueller ORN, MD;  Location: [facility] ORS;  Service: Neurosurgery;  Laterality: N/A;   BREAST EXCISIONAL BIOPSY Left 2004-2005   benign   BREAST SURGERY     CESAREAN SECTION  2006   CHOLECYSTECTOMY     COLONOSCOPY N/A 12/28/2023   Procedure: COLONOSCOPY;  Surgeon: Tonya Elspeth Sharper, DO;  Location: [facility] ENDOSCOPY;  Service: Gastroenterology;  Laterality: N/A;  DM   ETHMOIDECTOMY Left 05/24/2022   Procedure: ETHMOIDECTOMY- TOTAL WITH FRONTAL SINUS EXPLORATION;  Surgeon: Tonya Mt, MD;  Location: [facility];   Service: ENT;  Laterality: Left;  Diabetic   GALLBLADDER SURGERY     IMAGE GUIDED SINUS SURGERY Left 05/24/2022   Procedure: IMAGE GUIDED SINUS SURGERY;  Surgeon: Tonya Mt, MD;  Location: [facility];  Service: ENT;  Laterality: Left;  PLACED DISK ON OR CHARGE NURSE DESK 8-23 KP   MAXILLARY ANTROSTOMY Left 05/24/2022   Procedure: MAXILLARY ANTROSTOMY WITH TISSUE REMOVAL;  Surgeon: Tonya Mt, MD;  Location: [facility];  Service: ENT;  Laterality: Left;   POLYPECTOMY  12/28/2023   Procedure: POLYPECTOMY, INTESTINE;  Surgeon: Tonya Elspeth Sharper, DO;  Location: [facility] ENDOSCOPY;  Service: Gastroenterology;;   Patient Active Problem List   Diagnosis Date Noted   S/P cervical spinal fusion 04/25/2023   Cervical disc disorder with radiculopathy of cervical region 04/05/2023   Left arm weakness 04/05/2023    PCP: Clotilda Leaven, NP  REFERRING PROVIDER: Penne Claudene, MD (neurosurgery)   REFERRING DIAG: Post surgical neck pain   THERAPY DIAG:  Cervicalgia  Chronic thoracic spine pain  Muscle weakness (generalized)  Rationale for Evaluation and Treatment: rehabilitation  ONSET DATE: April 24, 2024  SUBJECTIVE:  PERTINENT HISTORY:  Tonya Mueller is a 58yoF s/p C6-7 ACDF referred to OPPT from neurosurgical due to ongoing pain following surgery, inability to resume baseline functional use of BUE for return to work duties. ACDF in September 2024, FU with doctor in November noted radicular pain, has since improved but now pt has neck stiffness, Right posterolateral neck pain, and periscapular pain. EMG showed a chronic but not active radiculopathy Left. MRI showed some improvement in decompression. Pt referred to PT and pain management due to ongoing pain issues and need  for conservative management. Dr. Claudene has not ruled out facet referral pain as a factor. Patient has been out of work since June 2024.   Exercise history: was active before. Has an elliptical machine at home that she used to use regularly. Cable machine, some dumbbells (unsure what weight)  Patient states she originally hurt neck at work. She lifted a box from overhead and the box fell on her. She caught it over her L chest/shoulder. She continued working but started having pain down the left arm. She had a positive abduction sign and having her arm over her head was the only way to control the pain. She took a lot of different medications and went to physical therapy. After 3 months it was only a little better so she consulted with a surgeon who recommended surgery.   SUBJECTIVE STATEMENT: Patient saw Dr. Claudene yesterday who ordered an xray (has not had yet) to check the location of the fusion hardware. He also sent an order to Dr. Dodson to get an injection there. She is feeling about the same as before last visit. It took 2 hours for her hand to feel back to baseline again after last PT session. She states she carried a very light bag from [employer] around the store and to the car. It made her forearm a little painful but then it went away.   PAIN:  NPRS: 3/10 pain in the left upper arm when she rotates her hand right. She has constant paresthesia in her left UE to digits 1-3.      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 She sees her PCP on March 01, 2024.    OBJECTIVE:     TREATMENT                                                                                                                    Therapeutic exercise: therapeutic exercises that incorporate  ONE parameter at one or more areas of the body to centralize symptoms, develop strength and endurance, range of motion, and flexibility required for successful completion of functional activities.  Upper body ergometer level 10 to encourage joint nutrition, warm tissue, induce analgesic effect of aerobic exercise, improve muscular strength and endurance,  and  prepare for remainder of session. 3 min forwards 2 minutes backwards  Superset:  Standing B scaption/flexion 3x15 with 2#DB  Increased pain/paresthesia in left UE Lag at L UE   Standing bicep curl (single arm) 3x15 each side with 2#DB Increased pain/paresthesia in left UE, calms but still lingers with rest Superset:  Standing shoulder IR isometric against pilates ring 3x15 with 5 second squeezes   Standing L shoulder ER with palms up with theraband 3x15 with YTB hands Pt has increased pain when she retracts her scapulae so reomved this part of movement.   **L hand completely numb at this point**  Seated L wrist/finger flexion with forearm supported using DB  2x15 with 1# DB  Education on HEP including handout    Therapeutic activities: dynamic therapeutic activities incorporating MULTIPLE parameters or areas of the body designed to achieve improved functional performance.   KB suitcase carry 3x100 feet holding 10#KB in each side Difficulty with holding 10#KB on left with need to re-adjust so as not to drop it Increased L UE pain and paresthesia  Pt required multimodal cuing for proper technique and to facilitate improved neuromuscular control, strength, range of motion, and functional ability resulting in improved performance and form.   PATIENT EDUCATION:  Education details: Exercise purpose/form. Self management techniques. Person educated: Patient Education method: Explanation, Demonstration, Tactile cues, Verbal cues, and Handouts Education comprehension: verbalized understanding, returned demonstration, and  needs further education  HOME EXERCISE PROGRAM: Access Code: [email or web address] URL: [email or web address] Date: 12/05/2023 Prepared by: Camie Cleverly  Exercises - Supine Cervical Retraction with Towel  - 3 x daily - 1 sets - 10 reps - 3sec hold - Forearm Walks on Wall with Resistance Band  - 1 x daily - 2-3 sets - 10 reps - Shoulder External Rotation and Scapular Retraction with Resistance  - 1 x daily - 3 sets - 15 reps - Scapular Protraction at Wall  - 1 x daily - 2-3 sets - 20 reps  HOME EXERCISE PROGRAM [[id number]]  View at [email or web address] code [id number] Suboccipital Stretch - (repeated upper cervical flexion nod with OP)  ASSESSMENT:  CLINICAL IMPRESSION: Patient's symptoms back to baseline since last PT session. Continued with strengthening exercises. Patient's L UE again went completely numb and pain up to 4/10 by end of session. HEP updated to include tolerated exercises for her to perform strengthening more frequently during the week. She continued to quickly fatigue and lose AROM  and or not be able to hold 10# wight for entire 100 feet without compensations. Patient would benefit from continued management of limiting condition by skilled physical therapist to address remaining impairments and functional limitations to work towards stated goals and return to PLOF or maximal functional independence.   From initial PT evaluation on 11/09/2023:  58yoF presenting with ongoing neck pain s/p ADCF 6 months prior, is unable to tolerate head movements required for ADL/IADL,driving and has been unable to resume moderate to heavy lifting for return to work. Exam revealing of restricted ROM in cervical spine. Patient will benefit from skilled physical therapy intervention to reduce deficits and impairments identified in evaluation, in order to reduce pain, improve quality of life, and maximize activity tolerance for ADL, IADL, and leisure/fitness. Physical therapy will help pt achieve long  and short term goals of care.     OBJECTIVE IMPAIRMENTS: decreased activity tolerance, decreased ROM, and pain.   ACTIVITY LIMITATIONS: carrying, lifting, bending, sitting, bathing, dressing, and reach over head  PARTICIPATION LIMITATIONS: meal prep, cleaning, laundry, interpersonal  relationship, driving, shopping, community activity, occupation, and yard work  PERSONAL FACTORS: Age, Fitness, Past/current experiences, and Time since onset of injury/illness/exacerbation are also affecting patient's functional outcome.   REHAB POTENTIAL: Excellent  CLINICAL DECISION MAKING: Evolving/moderate complexity  EVALUATION COMPLEXITY: Moderate   GOALS: Goals reviewed with patient? No  SHORT TERM GOALS: Target date: 01/31/24  Improved cervical rotation ROM by >12 degrees bilat Baseline: ~50 degrees bilat; 01/03/24: 50/65 R/L Goal status: Partially met  2.  Tolerance of RDL lift ~20lb x20 reps without increased pain >2 points NPRS.  Baseline: deferred due to time Goal status: In progress  3.  Regular performance of HEP for cervical ROM improvement and BUE loading.  Baseline: 01/03/24: 100% compliance  Goal status: MET  LONG TERM GOALS: Target date: 02/28/24  Pt to demonstrate cervical rotation ROM>70 degrees bilat and combined lumbar+thoracic+cervical rotation >100 degrees bilat Baseline: 01/03/24: 50/65 degrees cervical rotation. Goal status: In progress  2.  Pt to demonstrate ability to lift 40lb from table height and carry x138ft in preparation for return to work related duties.  Baseline: 01/03/24: 26' significant difficulty being able to hold a 40# box only able to ambulate ~39' with regular L hand grip slipping off the box needing to use anterior L thigh to assist holding box up ultimately needed PT assist to prevent dropping box Goal status: In progress  3.  Pt to report successful performance of all home-care tasks (activity modifications as needed) without pain exacerbation >1/10.    Baseline: 01/03/24: 3/10 NPS.  Goal status: In progress  4.  Pt to report readiness for return to work and to improve NDI by >30%.  Baseline: NDI: 50% on 11/09/23; 01/03/24: 38% Goal status: In progress  5. Pt will improve LUE grip strength to age and sex matched norms to assist in return to prior heavy lifting work duties.   Baseline: RUE: 53.33 lbs; LUE: 21.6 lbs  Goal status: Initial  PLAN:  PT FREQUENCY: 1-2x/week  PT DURATION: 8 weeks  PLANNED INTERVENTIONS: 97110-Therapeutic exercises, 97530- Therapeutic activity, [id number]- Neuromuscular re-education, 97535- Self Care, [id number]- Manual therapy, [id number]- Electrical stimulation (unattended), [phone]- Electrical stimulation (manual), Patient/Family education, Dry Needling, Spinal mobilization, Scar mobilization, DME instructions, Cryotherapy, and Moist heat  PLAN FOR NEXT SESSION:  Update HEP as appropriate, manual therapy and stretching as needed, progressive postural/UE/functional motor control/strengthening/ROM/stretch as appropriate.   Camie SAUNDERS. Juli, PT, DPT 02/15/24, 4:18 PM  [facility] [address], [place] [address] P: [phone] I F: [phone]

## 2024-02-15 NOTE — Progress Notes (Signed)
   DOS: 04/25/2023  HISTORY OF PRESENT ILLNESS: 02/14/2024 [name] is status post C6-7 anterior cervical discectomy and fusion.  She had a previous procedure for radiculopathy with significant neck pain.  She had an improvement in her motor weakness after her decompression and fusion but has had numerous issues postoperatively.  This includes neck pain, periscapular pain.  Thankfully the periscapular pain has improved, however she does feel that her upper extremity symptoms are getting worse and these are bilateral at this time.  She is being seen today at the request of her physical therapist.  PHYSICAL EXAMINATION:   Vitals:   02/14/24 1444  BP: 126/78   General: Patient is well developed, well nourished, calm, collected, and in no apparent distress.  NEUROLOGICAL:  General: In no acute distress.  Awake, alert, oriented to person, place, and time. Pupils equal round and reactive to light.   Strength: Inconsistent giveaway phenomenon noted in the bilateral upper extremities, no clear myotomal localization at this point.    Incision c/d/i   ROS (Neurologic): Negative except as noted above  IMAGING: We obtained a postoperative MRI cross-sectional imaging to evaluate for any ongoing compression, he had significantly improved from preoperative.   Electrodiagnostics: We did get a recent EMG and January which demonstrated a chronic radiculopathy without any active issues.  ASSESSMENT/PLAN:  Tonya Mueller is having continued issues with her bilateral upper extremities left worse than right.  Continued issues with some neck pain.  She is being seen today at request of her physical therapist.  On physical examination she does have a difficult to localize examination as there is a giveaway phenomenon noted in multiple myotomes and not clearly consistent or reproducible.  Because she is also complaining of bilateral issues as well as lower extremity issues I like to get an x-ray to  evaluate whether or not she has had any migration of her cage or any junctional disease development.  Will plan on getting those x-rays today.  Penne MICAEL Sharps, MD/MSCR  Spent a total of 30 minutes on her evaluation today.  This included face-to-face evaluation, review of her chart, review of her documentation, documenting her visit, and coordinating her care going forward. Department of Neurosurgery

## 2024-02-20 ENCOUNTER — Ambulatory Visit
Admission: RE | Admit: 2024-02-20 | Discharge: 2024-02-20 | Disposition: A | Discharge status: Home / Self Care | Source: Ambulatory Visit | Attending: Gerontology | Admitting: Gerontology

## 2024-02-20 DIAGNOSIS — Z1231 Encounter for screening mammogram for malignant neoplasm of breast: Secondary | ICD-10-CM | POA: Diagnosis present

## 2024-02-22 ENCOUNTER — Ambulatory Visit: Attending: Neurosurgery | Admitting: Physical Therapy

## 2024-02-22 ENCOUNTER — Encounter: Payer: Self-pay | Admitting: Physical Therapy

## 2024-02-22 DIAGNOSIS — M542 Cervicalgia: Secondary | ICD-10-CM | POA: Diagnosis present

## 2024-02-22 DIAGNOSIS — M546 Pain in thoracic spine: Secondary | ICD-10-CM | POA: Insufficient documentation

## 2024-02-22 DIAGNOSIS — G8929 Other chronic pain: Secondary | ICD-10-CM | POA: Diagnosis present

## 2024-02-22 DIAGNOSIS — M6281 Muscle weakness (generalized): Secondary | ICD-10-CM | POA: Insufficient documentation

## 2024-02-22 NOTE — Therapy (Signed)
 OUTPATIENT PHYSICAL THERAPY TREATMENT   Patient Name: Tonya Mueller MRN: [id number] DOB:21-Jan-1966, 58 y.o., female Today's Date: 02/22/2024  END OF SESSION:  PT End of Session - 02/22/24 1935     Visit Number 16    Number of Visits 25    Date for PT Re-Evaluation 02/28/24    Authorization Type [place] [employer]ICA[employer] [place] [employer] reporting period from 01/03/2024    Authorization Time Period CC [id number] for 12 PT vsts from 6/2-7/10    Authorization - Visit Number 6    Authorization - Number of Visits 12    Progress Note Due on Visit 20    PT Start Time 1605    PT Stop Time 1705    PT Time Calculation (min) 60 min    Activity Tolerance Patient limited by pain;Patient tolerated treatment well    Behavior During Therapy WFL for tasks assessed/performed               Past Medical History:  Diagnosis Date   Allergic rhinitis    Angio-edema    Cervical radiculopathy    Chronic sinusitis    COVID-19    Elevated liver enzymes    Fatty liver disease, nonalcoholic    GERD (gastroesophageal reflux disease)    Peroneal tendonitis, left    Plantar fasciitis    Situational depression    Trigger finger of left  hand    Type 2 diabetes mellitus (HCC)    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    Past Surgical History:  Procedure Laterality Date   ANTERIOR CERVICAL DECOMP/DISCECTOMY FUSION N/A 04/25/2023   Procedure: C6-7 ANTERIOR CERVICAL DISCECTOMY AND FUSION (GLOBUS FORGE);  Surgeon: Claudene Penne ORN, MD;  Location: [facility] ORS;  Service: Neurosurgery;  Laterality: N/A;   BREAST EXCISIONAL BIOPSY Left 2004-2005   benign   BREAST SURGERY     CESAREAN SECTION  2006   CHOLECYSTECTOMY     COLONOSCOPY N/A 12/28/2023   Procedure: COLONOSCOPY;  Surgeon: Onita Elspeth Sharper, DO;  Location: [facility] ENDOSCOPY;  Service: Gastroenterology;  Laterality: N/A;  DM   ETHMOIDECTOMY Left 05/24/2022   Procedure: ETHMOIDECTOMY- TOTAL WITH FRONTAL SINUS EXPLORATION;  Surgeon: Blair Mt, MD;   Location: [facility];  Service: ENT;  Laterality: Left;  Diabetic   GALLBLADDER SURGERY     IMAGE GUIDED SINUS SURGERY Left 05/24/2022   Procedure: IMAGE GUIDED SINUS SURGERY;  Surgeon: Blair Mt, MD;  Location: [facility];  Service: ENT;  Laterality: Left;  PLACED DISK ON OR CHARGE NURSE DESK 8-23 KP   MAXILLARY ANTROSTOMY Left 05/24/2022   Procedure: MAXILLARY ANTROSTOMY WITH TISSUE REMOVAL;  Surgeon: Blair Mt, MD;  Location: [facility];  Service: ENT;  Laterality: Left;   POLYPECTOMY  12/28/2023   Procedure: POLYPECTOMY, INTESTINE;  Surgeon: Onita Elspeth Sharper, DO;  Location: [facility]COPY;  Service: Gastroenterology;;   Patient Active Problem List   Diagnosis Date Noted   S/P cervical spinal fusion 04/25/2023   Cervical disc disorder with radiculopathy of cervical region 04/05/2023   Left arm weakness 04/05/2023    PCP: Clotilda Leaven, NP  REFERRING PROVIDER: Penne Claudene, MD (neurosurgery)   REFERRING DIAG: Post surgical neck pain   THERAPY DIAG:  Cervicalgia  Chronic thoracic spine pain  Muscle weakness (generalized)  Rationale for Evaluation and Treatment: rehabilitation  ONSET DATE: April 24, 2024  SUBJECTIVE:  PERTINENT HISTORY:  Tonya Mueller is a 58yoF s/p C6-7 ACDF referred to OPPT from neurosurgical due to ongoing pain following surgery, inability to resume baseline functional use of BUE for return to work duties. ACDF in September 2024, FU with doctor in November noted radicular pain, has since improved but now pt has neck stiffness, Right posterolateral neck pain, and periscapular pain. EMG showed a chronic but not active radiculopathy Left. MRI showed some improvement in decompression. Pt referred to PT and pain management due to  ongoing pain issues and need for conservative management. Dr. Claudene has not ruled out facet referral pain as a factor. Patient has been out of work since June 2024.   Exercise history: was active before. Has an elliptical machine at home that she used to use regularly. Cable machine, some dumbbells (unsure what weight)  Patient states she originally hurt neck at work. She lifted a box from overhead and the box fell on her. She caught it over her L chest/shoulder. She continued working but started having pain down the left arm. She had a positive abduction sign and having her arm over her head was the only way to control the pain. She took a lot of different medications and went to physical therapy. After 3 months it was only a little better so she consulted with a surgeon who recommended surgery.   SUBJECTIVE STATEMENT: Patient states she was cleaning earlier today, which caused her left arm to be numb and pain in the base of her neck. She was feeling better last week after she took B vitamins and Diclofenac  orally when she took it for 5 days.   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 She sees her PCP on March 01, 2024.    OBJECTIVE:   OBJECTIVE   NEUROLOGICAL Dermatomes C3: intact C4:intact C5: a little shock on the left C6: little tingly on L C7: diminished on L C8: intact T1: intact  Myotomes (left compared to right) C3: diminished (cervical sidebending) C4: diminished? (UT elevation) C5: diminished (shoulder ER) C6: diminished (elbow flexion) C7: diminished (elbow extension) C8: diminished (thumb extension) T1: diminished (abductor  digiti-minimi)  Deep Tendon Reflexes R/L  2+/1+ Biceps brachii reflex (C5, C6) 1+/0+ Brachioradialis reflex (C6) 2+/0+ Triceps brachii reflex (C7)  SPINE MOTION CERVICAL SPINE AROM (to pain threshold) Flexion: 40 Extension: 25 Side Flexion:   R 20  L 25 Rotation:  R 46 L 47 Protrusion: 1.25 inches Retraction: 0.5 inches (very painful, did not stop before pain).   PALPATION: TTP at C7/T1  ACCESSORY MOTION Cervical spine manual distraction decreases L UE pain and improves ER strength.   REPEATED MOTIONS TESTING: Repeated cervical retraction with distraction and clinician overpressure in supine decreased symptoms in left UE and improved pain, but did not last (symptoms gradually returning after sitting up).  Repeated cervical retraction in sitting peripheralizing, worse.     TREATMENT  Therapeutic exercise: therapeutic exercises that incorporate ONE parameter at one or more areas of the body to centralize symptoms, develop strength and endurance, range of motion, and flexibility required for successful completion of functional activities.  Re-examination to attempt to find mechanical stressors and a way to help decrease symptoms (see above).   Seated cervical retraction AROM 1x10 worsening L UE symtoms  Seated cevical retraction with self OP 1x5 worsening L UE symptoms, discontinued  Education and trial of seated posture with towel roll. Educated on increased load on the spine with forward head posture and cervical spine bending.   Education about peripheralization and centralization  Attempted supine laying with retraction and/or relaxation, but symptoms starting to return (   Manual therapy: to reduce pain and tissue tension, improve range of motion, neuromodulation, in order to promote improved ability to complete functional  activities. SUPINE Cervical spine distraction  MDT Repeated cervical retraction with distraction and clinician overpressure in supine  6x6 reps attempting to move into extension over time Unable to move past slight extension due to increased symptoms   Pt required multimodal cuing for proper technique and to facilitate improved neuromuscular control, strength, range of motion, and functional ability resulting in improved performance and form.   PATIENT EDUCATION:  Education details: Exercise purpose/form. Self management techniques. Person educated: Patient Education method: Explanation, Demonstration, Tactile cues, Verbal cues, and Handouts Education comprehension: verbalized understanding, returned demonstration, and needs further education  HOME EXERCISE PROGRAM: Access Code: [email or web address] URL: [email or web address] Date: 12/05/2023 Prepared by: Camie Cleverly  Exercises - Supine Cervical Retraction with Towel  - 3 x daily - 1 sets - 10 reps - 3sec hold - Forearm Walks on Wall with Resistance Band  - 1 x daily - 2-3 sets - 10 reps - Shoulder External Rotation and Scapular Retraction with Resistance  - 1 x daily - 3 sets - 15 reps - Scapular Protraction at Wall  - 1 x daily - 2-3 sets - 20 reps  HOME EXERCISE PROGRAM [[id number]]  View at [email or web address] code [id number] Suboccipital Stretch - (repeated upper cervical flexion nod with OP)  ASSESSMENT:  CLINICAL IMPRESSION: Today's session focused on re-examination of patient's presentation since she continues to be only aggravated by attempts to strengthen her function with PT and this weakness seems to be largely neurogenic. Re-exam in hopes of helping to guide treatment to improve neurologic function and symptoms. Patient was sensitive to load, has known neural sensitity, and is sensitive to flexion and extension under load. She did have centralization and improvement of symptoms with unloaded traction with cervical spine  retraction, but was unable to progress to extension and her symptoms started to return after this manual technique was finished (she had improvement during application of pressure, but symptoms gradually returned following). She was unable to tolerate upright cervical retraction or patient lead parallel exercise to manual technique. She was educated on seated posture, and mechanical stressors, but continues to have difficulty avoiding them. Plan to continue to apply this manual technique and attempt to further explore mechanical stressors and how to avoid them next visit. She is also due for a re-certification next week. Patient would benefit from continued management of limiting condition by skilled physical therapist to address remaining impairments and functional limitations to work towards stated goals and return to PLOF or maximal functional independence.    From initial PT evaluation on 11/09/2023:  58yoF presenting with ongoing neck pain s/p ADCF 6 months prior, is unable to tolerate head movements required for  ADL/IADL,driving and has been unable to resume moderate to heavy lifting for return to work. Exam revealing of restricted ROM in cervical spine. Patient will benefit from skilled physical therapy intervention to reduce deficits and impairments identified in evaluation, in order to reduce pain, improve quality of life, and maximize activity tolerance for ADL, IADL, and leisure/fitness. Physical therapy will help pt achieve long and short term goals of care.     OBJECTIVE IMPAIRMENTS: decreased activity tolerance, decreased ROM, and pain.   ACTIVITY LIMITATIONS: carrying, lifting, bending, sitting, bathing, dressing, and reach over head  PARTICIPATION LIMITATIONS: meal prep, cleaning, laundry, interpersonal relationship, driving, shopping, community activity, occupation, and yard work  PERSONAL FACTORS: Age, Fitness, Past/current experiences, and Time since onset of  injury/illness/exacerbation are also affecting patient's functional outcome.   REHAB POTENTIAL: Excellent  CLINICAL DECISION MAKING: Evolving/moderate complexity  EVALUATION COMPLEXITY: Moderate   GOALS: Goals reviewed with patient? No  SHORT TERM GOALS: Target date: 01/31/24  Improved cervical rotation ROM by >12 degrees bilat Baseline: ~50 degrees bilat; 01/03/24: 50/65 R/L Goal status: Partially met  2.  Tolerance of RDL lift ~20lb x20 reps without increased pain >2 points NPRS.  Baseline: deferred due to time Goal status: In progress  3.  Regular performance of HEP for cervical ROM improvement and BUE loading.  Baseline: 01/03/24: 100% compliance  Goal status: MET  LONG TERM GOALS: Target date: 02/28/24  Pt to demonstrate cervical rotation ROM>70 degrees bilat and combined lumbar+thoracic+cervical rotation >100 degrees bilat Baseline: 01/03/24: 50/65 degrees cervical rotation. Goal status: In progress  2.  Pt to demonstrate ability to lift 40lb from table height and carry x116ft in preparation for return to work related duties.  Baseline: 01/03/24: 62' significant difficulty being able to hold a 40# box only able to ambulate ~39' with regular L hand grip slipping off the box needing to use anterior L thigh to assist holding box up ultimately needed PT assist to prevent dropping box Goal status: In progress  3.  Pt to report successful performance of all home-care tasks (activity modifications as needed) without pain exacerbation >1/10.   Baseline: 01/03/24: 3/10 NPS.  Goal status: In progress  4.  Pt to report readiness for return to work and to improve NDI by >30%.  Baseline: NDI: 50% on 11/09/23; 01/03/24: 38% Goal status: In progress  5. Pt will improve LUE grip strength to age and sex matched norms to assist in return to prior heavy lifting work duties.   Baseline: RUE: 53.33 lbs; LUE: 21.6 lbs  Goal status: Initial  PLAN:  PT FREQUENCY: 1-2x/week  PT DURATION: 8  weeks  PLANNED INTERVENTIONS: 97110-Therapeutic exercises, 97530- Therapeutic activity, [id number]- Neuromuscular re-education, 97535- Self Care, [id number]- Manual therapy, [id number]- Electrical stimulation (unattended), [phone]- Electrical stimulation (manual), Patient/Family education, Dry Needling, Spinal mobilization, Scar mobilization, DME instructions, Cryotherapy, and Moist heat  PLAN FOR NEXT SESSION:  Update HEP as appropriate, manual therapy and stretching as needed, progressive postural/UE/functional motor control/strengthening/ROM/stretch as appropriate.   Camie SAUNDERS. Juli, PT, DPT 02/22/24, 7:47 PM  [facility] [address] [place], [place] [address] P: [phone] I F: [phone]

## 2024-02-27 ENCOUNTER — Ambulatory Visit: Admitting: Physical Therapy

## 2024-02-27 ENCOUNTER — Encounter: Payer: Self-pay | Admitting: Physical Therapy

## 2024-02-27 DIAGNOSIS — M542 Cervicalgia: Secondary | ICD-10-CM | POA: Diagnosis not present

## 2024-02-27 DIAGNOSIS — M6281 Muscle weakness (generalized): Secondary | ICD-10-CM

## 2024-02-27 DIAGNOSIS — G8929 Other chronic pain: Secondary | ICD-10-CM

## 2024-02-27 NOTE — Therapy (Signed)
 OUTPATIENT PHYSICAL THERAPY DISCHARGE SUMMARY Dates of reporting from 01/03/2024 to 02/27/2024   Patient Name: Tonya Mueller MRN: [id number] DOB:September 26, 1965, 58 y.o., female Today's Date: 02/27/2024  END OF SESSION:  PT End of Session - 02/27/24 2002     Visit Number 17    Number of Visits 25    Date for PT Re-Evaluation 02/28/24    Authorization Type [place] MEDICAID  COMPLETE HEALTH reporting period from 01/03/2024    Authorization Time Period CC [id number] for 12 PT vsts from 6/2-7/10    Authorization - Visit Number 7    Authorization - Number of Visits 12    Progress Note Due on Visit 20    PT Start Time 1523    PT Stop Time 1603    PT Time Calculation (min) 40 min    Activity Tolerance Patient limited by pain;Patient tolerated treatment well    Behavior During Therapy WFL for tasks assessed/performed            Past Medical History:  Diagnosis Date   Allergic rhinitis    Angio-edema    Cervical radiculopathy    Chronic sinusitis    COVID-19    Elevated liver enzymes    Fatty liver disease, nonalcoholic    GERD (gastroesophageal reflux disease)    Peroneal tendonitis, left    Plantar fasciitis    Situational depression    Trigger finger of left  hand    Type 2 diabetes mellitus (HCC)    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    Past Surgical History:  Procedure Laterality Date   ANTERIOR CERVICAL DECOMP/DISCECTOMY FUSION N/A 04/25/2023   Procedure: C6-7 ANTERIOR CERVICAL DISCECTOMY AND FUSION (GLOBUS FORGE);  Surgeon: Claudene Penne ORN, MD;  Location: [facility];  Service: Neurosurgery;  Laterality: N/A;   BREAST EXCISIONAL BIOPSY Left 2004-2005   benign   BREAST SURGERY     CESAREAN SECTION  2006   CHOLECYSTECTOMY     COLONOSCOPY N/A 12/28/2023   Procedure: COLONOSCOPY;  Surgeon: Onita Elspeth Sharper, DO;  Location: [facility];  Service: Gastroenterology;  Laterality: N/A;  DM   ETHMOIDECTOMY Left 05/24/2022   Procedure: ETHMOIDECTOMY- TOTAL WITH FRONTAL  SINUS EXPLORATION;  Surgeon: Blair Mt, MD;  Location: [facility];  Service: ENT;  Laterality: Left;  Diabetic   GALLBLADDER SURGERY     IMAGE GUIDED SINUS SURGERY Left 05/24/2022   Procedure: IMAGE GUIDED SINUS SURGERY;  Surgeon: Blair Mt, MD;  Location: [facility];  Service: ENT;  Laterality: Left;  PLACED DISK ON OR CHARGE NURSE DESK 8-23 KP   MAXILLARY ANTROSTOMY Left 05/24/2022   Procedure: MAXILLARY ANTROSTOMY WITH TISSUE REMOVAL;  Surgeon: Blair Mt, MD;  Location: [facility];  Service: ENT;  Laterality: Left;   POLYPECTOMY  12/28/2023   Procedure: POLYPECTOMY, INTESTINE;  Surgeon: Onita Elspeth Sharper, DO;  Location: [facility];  Service: Gastroenterology;;   Patient Active Problem List   Diagnosis Date Noted   S/P cervical spinal fusion 04/25/2023   Cervical disc disorder with radiculopathy of cervical region 04/05/2023   Left arm weakness 04/05/2023    PCP: Clotilda Leaven, NP  REFERRING PROVIDER: Penne Claudene, MD (neurosurgery)   REFERRING DIAG: Post surgical neck pain   THERAPY DIAG:  Cervicalgia  Chronic thoracic spine pain  Muscle weakness (generalized)  Rationale for Evaluation and Treatment: rehabilitation  ONSET DATE: April 24, 2024  SUBJECTIVE:  PERTINENT HISTORY:  Tonya Mueller is a 58yoF s/p C6-7 ACDF referred to OPPT from neurosurgical due to ongoing pain following surgery, inability to resume baseline functional use of BUE for return to work duties. ACDF in September 2024, FU with doctor in November noted radicular pain, has since improved but now pt has neck stiffness, Right posterolateral neck pain, and periscapular pain. EMG showed a chronic but not active radiculopathy Left. MRI showed some improvement in  decompression. Pt referred to PT and pain management due to ongoing pain issues and need for conservative management. Dr. Claudene has not ruled out facet referral pain as a factor. Patient has been out of work since June 2024.   Exercise history: was active before. Has an elliptical machine at home that she used to use regularly. Cable machine, some dumbbells (unsure what weight)  Patient states she originally hurt neck at work. She lifted a box from overhead and the box fell on her. She caught it over her L chest/shoulder. She continued working but started having pain down the left arm. She had a positive abduction sign and having her arm over her head was the only way to control the pain. She took a lot of different medications and went to physical therapy. After 3 months it was only a little better so she consulted with a surgeon who recommended surgery.   SUBJECTIVE STATEMENT: Patient states she felt okay after she left last PT visit. The next day she felt something but not sure what it was. Saturday she woke up and was completely sore from the top of her neck, over both upper traps, and down to the middle of her back. She was doing the exercises but she got pain so she stopped. Saturday it felt like she had worked out really hard. The pain was more on the left, but it was in both. She took diclofenac  Saturday, Sunday, and Monday. Her pain started to feel better Monday. She thinks PT might be helping some because she feels less pain for 2-3 hours but her hand is also more asleep for 1-2 hours after PT.   PAIN:  NPRS: 2-3/10 pain in the base of the neck posterior left neck/UT,  left digits 1-3 are numb. (Similar to before last visit).   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 She sees her PCP on March 01, 2024.    OBJECTIVE (measured 02/22/2024 unless otherwise specified)  SELF-REPORTED FUNCTION (measured 02/27/2024) Neck Disability Index (NDI): 40% (range 0-100%)  NEUROLOGICAL Dermatomes C3: intact C4:intact C5: a little shock on the left C6: little tingly on L C7: diminished on L C8: intact T1: intact  Myotomes (left compared to right) C3: diminished (cervical sidebending) C4: diminished? (UT elevation) C5: diminished (shoulder ER) C6: diminished (elbow flexion) C7: diminished (elbow extension) C8: diminished (thumb extension) T1: diminished (abductor digiti-minimi)  Deep Tendon Reflexes R/L  2+/1+ Biceps brachii reflex (C5, C6) 1+/0+ Brachioradialis reflex (C6) 2+/0+ Triceps brachii reflex (C7)  SPINE MOTION (02/27/2024) CERVICAL SPINE AROM  * = pain Flexion: 30* pain to left arm Extension: 25* pain to left arm Side Flexion:   R 30  L 22 pain to left arm Rotation (cervical only):  R 60 L 52 pain to left arm Rotation (lumbar+thoracic+cervical):  R 70  L 70 Protrusion: 0.75 inches Retraction: 0.25 inches  PALPATION: TTP at C7/T1  ACCESSORY MOTION Cervical spine manual distraction decreases L UE pain and improves ER strength.   REPEATED MOTIONS TESTING: Repeated cervical retraction with distraction and clinician overpressure in supine decreased symptoms in left UE and improved pain, but did not last (symptoms gradually returning after sitting up). Back at baseline by next visit.  Repeated cervical retraction in sitting peripheralizing, worse.    FUNCTIONAL TESTING (measured 02/27/2024) Grip strength (in pounds, average of three measures).  R: (64+64+60)/3 = 62.7 L: (34+34+35)/ 3 = 34.3  Carrying 40# box:  61 feet total stopping to adjust and boost up with legs several times. 11 feet before she had to stop to boost it up the first time. Unable to carry at chest. Must carry with elbow extended. Hand is numb by end of  carry. Box slipping out of left hand.   RDL with 20#KB (held with both hands): 14 reps pain up to 5/10 in left arm and L hand is completely numb  TREATMENT                                                                                                                    Therapeutic exercise: therapeutic exercises that incorporate ONE parameter at one or more areas of the body to centralize symptoms, develop strength and endurance, range of motion, and flexibility required for successful completion of functional activities.  Spine AROM to re-assess goals (see above)  Therapeutic activities: dynamic therapeutic activities incorporating MULTIPLE parameters or areas of the body designed to achieve improved functional performance.  Functional testing to re-assess goals (see above)  Pt required multimodal cuing for proper technique and to facilitate improved neuromuscular control, strength, range of motion, and functional ability resulting in improved performance and form.   PATIENT EDUCATION:  Education details: discharge recommendations.  Person educated: Patient Education method: Explanation Education comprehension: verbalized understanding HOME EXERCISE PROGRAM: Access Code: [email or web address] URL: [email or web address] Date: 12/05/2023 Prepared by: Camie Cleverly  Exercises - Supine Cervical Retraction with Towel  - 3 x daily - 1 sets - 10 reps - 3sec hold - Forearm Walks on Wall with Resistance Band  - 1 x daily - 2-3 sets - 10 reps - Shoulder External Rotation and Scapular Retraction with Resistance  - 1 x daily - 3 sets - 15 reps - Scapular Protraction at Wall  - 1 x daily - 2-3 sets - 20 reps  HOME EXERCISE PROGRAM [[id number]]  View at [email or web address] code [id number] Suboccipital Stretch - (repeated upper cervical flexion nod with OP)  ASSESSMENT:  CLINICAL IMPRESSION: Patient has attended 17 physical therapy sessions since starting current episode of care on 11/09/2023.  She has not met any of her short or long term goals and progress is very limited to minimal, despite excellent participation and effort. Patient's L UE paresthesia and pain worsens with all attempts to strengthen the left UE, which appears to be weak due to neurogenic weakness, not deconditioning. She showed  some promise of response to repeated motions for extension preference (only responsive in unloaded position and unable to to take to full ROM) last PT session, but it had only temporary within visit relief (until she sat back up) and no lasting effect on her condition. Patient is now discharged form PT due to lack of meaningful progress.   OBJECTIVE IMPAIRMENTS: decreased activity tolerance, decreased ROM, and pain.   ACTIVITY LIMITATIONS: carrying, lifting, bending, sitting, bathing, dressing, and reach over head  PARTICIPATION LIMITATIONS: meal prep, cleaning, laundry, interpersonal relationship, driving, shopping, community activity, occupation, and yard work  PERSONAL FACTORS: Age, Fitness, Past/current experiences, and Time since onset of injury/illness/exacerbation are also affecting patient's functional outcome.   REHAB POTENTIAL: Excellent  CLINICAL DECISION MAKING: Evolving/moderate complexity  EVALUATION COMPLEXITY: Moderate   GOALS: Goals reviewed with patient? No  SHORT TERM GOALS: Target date: 01/31/24  Improved cervical rotation ROM by >12 degrees bilat Baseline: ~50 degrees bilat; 01/03/24: 50/65 R/L. 12/28/2023: R/L 60/52.  Goal status: Not met  2.  Tolerance of RDL lift ~20lb x20 reps without increased pain >2 points NPRS.  Baseline: deferred due to time. 02/27/2024: 14 reps pain up to 5/10 in left arm and L hand is completely numb (no pain in left arm prior).  Goal status: Not Met  3.  Regular performance of HEP for cervical ROM improvement and BUE loading.  Baseline: 01/03/24: 100% compliance. 02/27/2024: participating as able Goal status: MET  LONG TERM GOALS: Target  date: 02/28/24  Pt to demonstrate cervical rotation ROM>70 degrees bilat and combined lumbar+thoracic+cervical rotation >100 degrees bilat Baseline: 01/03/24: 50/65 degrees cervical rotation. 12/28/2023: R/L 60/52 cervical AORM, 70 each way with lumbar + thoracic + cervical AROM.  Goal status: Not met  2.  Pt to demonstrate ability to lift 40lb from table height and carry x129ft in preparation for return to work related duties.  Baseline: 01/03/24: 70' significant difficulty being able to hold a 40# box only able to ambulate ~39' with regular L hand grip slipping off the box needing to use anterior L thigh to assist holding box up ultimately needed PT assist to prevent dropping box. 02/27/2024: 61 feet total stopping to adjust and boost up with legs several times. 11 feet before she had to stop to boost it up the first time. Unable to carry at chest. Must carry with elbow extended. Hand is numb by end of carry. Box slipping out of left hand.  Goal status: Not met  3.  Pt to report successful performance of all home-care tasks (activity modifications as needed) without pain exacerbation >1/10.   Baseline: 01/03/24: 3/10 NPS. 02/27/2024: 4/10.  Goal status: Not MET  4.  Pt to report readiness for return to work and to improve NDI by >30%.  Baseline: NDI: 50% on 11/09/23; 01/03/24: 38%; 02/27/2024: 40%  Goal status: Not Met  5. Pt will improve LUE grip strength to age and sex matched norms to assist in return to prior heavy lifting work duties.  Baseline: RUE: 53.33 lbs; LUE: 21.6 lbs. 02/27/2023: R/L 62.7/34.3 - average of 3 trials.   Goal status: Not met  PLAN:  PT FREQUENCY: 1-2x/week  PT DURATION: 8 weeks  PLANNED INTERVENTIONS: 97110-Therapeutic exercises, 97530- Therapeutic activity, [id number]- Neuromuscular re-education, 97535- Self Care, [id number]- Manual therapy, [id number]- Electrical stimulation (unattended), [phone]- Electrical stimulation (manual), Patient/Family education, Dry Needling, Spinal mobilization,  Scar mobilization, DME instructions, Cryotherapy, and Moist heat  PLAN FOR NEXT SESSION:  Patient is now discharged form PT due to  insufficient progress.   Camie SAUNDERS. Juli, PT, DPT 02/27/24, 8:16 PM  [facility] [address] [place], [place] [address] P: [phone] I F: [phone]

## 2024-02-29 ENCOUNTER — Ambulatory Visit: Admitting: Physical Therapy

## 2024-03-04 ENCOUNTER — Ambulatory Visit
Admission: RE | Admit: 2024-03-04 | Discharge: 2024-03-04 | Disposition: A | Discharge status: Home / Self Care | Source: Ambulatory Visit | Attending: Neurosurgery | Admitting: Neurosurgery

## 2024-03-04 ENCOUNTER — Other Ambulatory Visit: Payer: Self-pay | Admitting: Neurosurgery

## 2024-03-04 DIAGNOSIS — Z981 Arthrodesis status: Secondary | ICD-10-CM

## 2024-03-05 ENCOUNTER — Encounter: Admitting: Physical Therapy

## 2024-03-11 ENCOUNTER — Ambulatory Visit (INDEPENDENT_AMBULATORY_CARE_PROVIDER_SITE_OTHER): Admitting: Neurosurgery

## 2024-03-11 DIAGNOSIS — Z981 Arthrodesis status: Secondary | ICD-10-CM

## 2024-03-11 DIAGNOSIS — M5412 Radiculopathy, cervical region: Secondary | ICD-10-CM | POA: Diagnosis not present

## 2024-03-11 DIAGNOSIS — M79622 Pain in left upper arm: Secondary | ICD-10-CM | POA: Diagnosis not present

## 2024-03-11 DIAGNOSIS — R29898 Other symptoms and signs involving the musculoskeletal system: Secondary | ICD-10-CM | POA: Diagnosis not present

## 2024-03-11 NOTE — Progress Notes (Signed)
 I had a follow-up phone call with Ms. Man today.  She was in a private setting I was in the office.  She gave consent to go forward to talk about her neck issues.  We discussed her persistent left upper extremity pain.  This been going on for some time.  We have been exhausting our conservative options.  She has gotten a repeat EMG which demonstrated the chronic radiculopathy.  Has had repeat imaging.  Continues to have persistent symptoms.  We discussed a posterior cervical foraminotomies as a treatment for her ongoing radiculopathy.  We did state that since this was not an active radiculopathy on her EMG that there is slightly lower chance of success.  She would like to try 1 more round of injections to see whether or not she gets any relief from injection therapy.  I will plan on reaching out to her pain team to see if they will be able to provide this for her.  She like to follow-up after that to discuss plan ongoing.  Spent a total of 10 minutes on the phone call today.

## 2024-03-25 ENCOUNTER — Encounter: Payer: Self-pay | Admitting: Neurosurgery

## 2024-08-03 DIAGNOSIS — E860 Dehydration: Secondary | ICD-10-CM | POA: Diagnosis present

## 2024-08-03 DIAGNOSIS — M4802 Spinal stenosis, cervical region: Secondary | ICD-10-CM | POA: Diagnosis present

## 2024-08-03 DIAGNOSIS — E119 Type 2 diabetes mellitus without complications: Secondary | ICD-10-CM | POA: Diagnosis present

## 2024-08-03 DIAGNOSIS — Z79899 Other long term (current) drug therapy: Secondary | ICD-10-CM

## 2024-08-03 DIAGNOSIS — J309 Allergic rhinitis, unspecified: Secondary | ICD-10-CM | POA: Diagnosis present

## 2024-08-03 DIAGNOSIS — Z981 Arthrodesis status: Secondary | ICD-10-CM

## 2024-08-03 DIAGNOSIS — Z9049 Acquired absence of other specified parts of digestive tract: Secondary | ICD-10-CM

## 2024-08-03 DIAGNOSIS — Z604 Social exclusion and rejection: Secondary | ICD-10-CM | POA: Diagnosis present

## 2024-08-03 DIAGNOSIS — E876 Hypokalemia: Secondary | ICD-10-CM | POA: Diagnosis present

## 2024-08-03 DIAGNOSIS — E66813 Obesity, class 3: Secondary | ICD-10-CM | POA: Diagnosis present

## 2024-08-03 DIAGNOSIS — G4730 Sleep apnea, unspecified: Secondary | ICD-10-CM | POA: Diagnosis present

## 2024-08-03 DIAGNOSIS — D721 Eosinophilia, unspecified: Secondary | ICD-10-CM | POA: Diagnosis present

## 2024-08-03 DIAGNOSIS — R188 Other ascites: Secondary | ICD-10-CM | POA: Diagnosis present

## 2024-08-03 DIAGNOSIS — K529 Noninfective gastroenteritis and colitis, unspecified: Secondary | ICD-10-CM | POA: Diagnosis present

## 2024-08-03 DIAGNOSIS — Z794 Long term (current) use of insulin: Secondary | ICD-10-CM

## 2024-08-03 DIAGNOSIS — K7581 Nonalcoholic steatohepatitis (NASH): Secondary | ICD-10-CM | POA: Diagnosis present

## 2024-08-03 DIAGNOSIS — Z888 Allergy status to other drugs, medicaments and biological substances status: Secondary | ICD-10-CM

## 2024-08-03 DIAGNOSIS — Z8616 Personal history of COVID-19: Secondary | ICD-10-CM

## 2024-08-03 DIAGNOSIS — K219 Gastro-esophageal reflux disease without esophagitis: Secondary | ICD-10-CM | POA: Diagnosis present

## 2024-08-03 DIAGNOSIS — K2980 Duodenitis without bleeding: Secondary | ICD-10-CM | POA: Diagnosis present

## 2024-08-03 DIAGNOSIS — Z59868 Other specified financial insecurity: Secondary | ICD-10-CM

## 2024-08-03 DIAGNOSIS — Z7985 Long-term (current) use of injectable non-insulin antidiabetic drugs: Secondary | ICD-10-CM

## 2024-08-03 DIAGNOSIS — Z6841 Body Mass Index (BMI) 40.0 and over, adult: Secondary | ICD-10-CM

## 2024-08-03 DIAGNOSIS — E785 Hyperlipidemia, unspecified: Secondary | ICD-10-CM | POA: Diagnosis present

## 2024-08-03 DIAGNOSIS — K652 Spontaneous bacterial peritonitis: Principal | ICD-10-CM | POA: Diagnosis present

## 2024-08-04 ENCOUNTER — Emergency Department

## 2024-08-04 ENCOUNTER — Other Ambulatory Visit: Payer: Self-pay

## 2024-08-04 ENCOUNTER — Encounter: Payer: Self-pay | Admitting: *Deleted

## 2024-08-04 ENCOUNTER — Inpatient Hospital Stay
Admission: EM | Admit: 2024-08-04 | Discharge: 2024-08-15 | DRG: 372 | Disposition: A | Discharge status: Home / Self Care | Attending: Internal Medicine | Admitting: Internal Medicine

## 2024-08-04 ENCOUNTER — Inpatient Hospital Stay

## 2024-08-04 DIAGNOSIS — K3189 Other diseases of stomach and duodenum: Secondary | ICD-10-CM | POA: Diagnosis not present

## 2024-08-04 DIAGNOSIS — Z9049 Acquired absence of other specified parts of digestive tract: Secondary | ICD-10-CM | POA: Diagnosis not present

## 2024-08-04 DIAGNOSIS — A419 Sepsis, unspecified organism: Secondary | ICD-10-CM | POA: Diagnosis not present

## 2024-08-04 DIAGNOSIS — D721 Eosinophilia, unspecified: Secondary | ICD-10-CM

## 2024-08-04 DIAGNOSIS — R6510 Systemic inflammatory response syndrome (SIRS) of non-infectious origin without acute organ dysfunction: Secondary | ICD-10-CM | POA: Diagnosis present

## 2024-08-04 DIAGNOSIS — E66813 Obesity, class 3: Secondary | ICD-10-CM | POA: Diagnosis not present

## 2024-08-04 DIAGNOSIS — R188 Other ascites: Secondary | ICD-10-CM

## 2024-08-04 DIAGNOSIS — Z59868 Other specified financial insecurity: Secondary | ICD-10-CM | POA: Diagnosis not present

## 2024-08-04 DIAGNOSIS — Z79899 Other long term (current) drug therapy: Secondary | ICD-10-CM | POA: Diagnosis not present

## 2024-08-04 DIAGNOSIS — Z7985 Long-term (current) use of injectable non-insulin antidiabetic drugs: Secondary | ICD-10-CM | POA: Diagnosis not present

## 2024-08-04 DIAGNOSIS — K652 Spontaneous bacterial peritonitis: Secondary | ICD-10-CM | POA: Insufficient documentation

## 2024-08-04 DIAGNOSIS — R197 Diarrhea, unspecified: Secondary | ICD-10-CM

## 2024-08-04 DIAGNOSIS — G4730 Sleep apnea, unspecified: Secondary | ICD-10-CM

## 2024-08-04 DIAGNOSIS — R0600 Dyspnea, unspecified: Secondary | ICD-10-CM | POA: Diagnosis not present

## 2024-08-04 DIAGNOSIS — E119 Type 2 diabetes mellitus without complications: Secondary | ICD-10-CM | POA: Diagnosis present

## 2024-08-04 DIAGNOSIS — Z981 Arthrodesis status: Secondary | ICD-10-CM | POA: Diagnosis not present

## 2024-08-04 DIAGNOSIS — K30 Functional dyspepsia: Secondary | ICD-10-CM

## 2024-08-04 DIAGNOSIS — E785 Hyperlipidemia, unspecified: Secondary | ICD-10-CM

## 2024-08-04 DIAGNOSIS — E876 Hypokalemia: Secondary | ICD-10-CM | POA: Diagnosis present

## 2024-08-04 DIAGNOSIS — Z6841 Body Mass Index (BMI) 40.0 and over, adult: Secondary | ICD-10-CM | POA: Diagnosis not present

## 2024-08-04 DIAGNOSIS — K529 Noninfective gastroenteritis and colitis, unspecified: Secondary | ICD-10-CM

## 2024-08-04 DIAGNOSIS — K7581 Nonalcoholic steatohepatitis (NASH): Secondary | ICD-10-CM | POA: Diagnosis present

## 2024-08-04 DIAGNOSIS — J309 Allergic rhinitis, unspecified: Secondary | ICD-10-CM | POA: Diagnosis present

## 2024-08-04 DIAGNOSIS — Z794 Long term (current) use of insulin: Secondary | ICD-10-CM | POA: Diagnosis not present

## 2024-08-04 DIAGNOSIS — D7219 Other eosinophilia: Secondary | ICD-10-CM | POA: Diagnosis not present

## 2024-08-04 DIAGNOSIS — K219 Gastro-esophageal reflux disease without esophagitis: Secondary | ICD-10-CM | POA: Diagnosis present

## 2024-08-04 DIAGNOSIS — Z8616 Personal history of COVID-19: Secondary | ICD-10-CM | POA: Diagnosis not present

## 2024-08-04 DIAGNOSIS — R1084 Generalized abdominal pain: Principal | ICD-10-CM

## 2024-08-04 DIAGNOSIS — E860 Dehydration: Secondary | ICD-10-CM | POA: Diagnosis present

## 2024-08-04 DIAGNOSIS — M4802 Spinal stenosis, cervical region: Secondary | ICD-10-CM | POA: Diagnosis present

## 2024-08-04 DIAGNOSIS — K2980 Duodenitis without bleeding: Secondary | ICD-10-CM | POA: Diagnosis present

## 2024-08-04 DIAGNOSIS — K31A19 Gastric intestinal metaplasia without dysplasia, unspecified site: Secondary | ICD-10-CM | POA: Diagnosis not present

## 2024-08-04 DIAGNOSIS — G4739 Other sleep apnea: Secondary | ICD-10-CM | POA: Diagnosis not present

## 2024-08-04 DIAGNOSIS — Z888 Allergy status to other drugs, medicaments and biological substances status: Secondary | ICD-10-CM | POA: Diagnosis not present

## 2024-08-04 LAB — LACTIC ACID, PLASMA: Lactic Acid, Venous: 0.7 mmol/L (ref 0.5–1.9)

## 2024-08-04 LAB — COMPREHENSIVE METABOLIC PANEL WITH GFR
ALT: 64 U/L — ABNORMAL HIGH (ref 0–44)
AST: 33 U/L (ref 15–41)
Albumin: 3.9 g/dL (ref 3.5–5.0)
Alkaline Phosphatase: 188 U/L — ABNORMAL HIGH (ref 38–126)
Anion gap: 12 (ref 5–15)
BUN: 16 mg/dL (ref 6–20)
CO2: 27 mmol/L (ref 22–32)
Calcium: 8.7 mg/dL — ABNORMAL LOW (ref 8.9–10.3)
Chloride: 99 mmol/L (ref 98–111)
Creatinine, Ser: 0.65 mg/dL (ref 0.44–1.00)
GFR, Estimated: >60 mL/min (ref >60)
Glucose, Bld: 160 mg/dL — ABNORMAL HIGH (ref 70–99)
Potassium: 4 mmol/L (ref 3.5–5.1)
Sodium: 138 mmol/L (ref 135–145)
Total Bilirubin: 0.7 mg/dL (ref 0.0–1.2)
Total Protein: 6.4 g/dL — ABNORMAL LOW (ref 6.5–8.1)

## 2024-08-04 LAB — GASTROINTESTINAL PANEL BY PCR, STOOL (REPLACES STOOL CULTURE)

## 2024-08-04 LAB — FOLATE: Folate: 9.9 ng/mL (ref >5.9)

## 2024-08-04 LAB — CBC
HCT: 48.2 % — ABNORMAL HIGH (ref 36.0–46.0)
Hemoglobin: 15.6 g/dL — ABNORMAL HIGH (ref 12.0–15.0)
MCH: 28.5 pg (ref 26.0–34.0)
MCHC: 32.4 g/dL (ref 30.0–36.0)
MCV: 88 fL (ref 80.0–100.0)
Platelets: 230 K/uL (ref 150–400)
RBC: 5.48 MIL/uL — ABNORMAL HIGH (ref 3.87–5.11)
RDW: 13 % (ref 11.5–15.5)
WBC: 21.5 K/uL — ABNORMAL HIGH (ref 4.0–10.5)
nRBC: 0 % (ref 0.0–0.2)

## 2024-08-04 LAB — RETICULOCYTES
Immature Retic Fract: 17.3 % — ABNORMAL HIGH (ref 2.3–15.9)
RBC.: 4.89 MIL/uL (ref 3.87–5.11)
Retic Count, Absolute: 77.8 K/uL (ref 19.0–186.0)
Retic Ct Pct: 1.6 % (ref 0.4–3.1)

## 2024-08-04 LAB — C DIFFICILE QUICK SCREEN W PCR REFLEX
C Diff antigen: NEGATIVE
C Diff interpretation: NOT DETECTED
C Diff toxin: NEGATIVE

## 2024-08-04 LAB — CBG MONITORING, ED
Glucose-Capillary: 92 mg/dL (ref 70–99)
Glucose-Capillary: 108 mg/dL — ABNORMAL HIGH (ref 70–99)
Glucose-Capillary: 105 mg/dL — ABNORMAL HIGH (ref 70–99)

## 2024-08-04 LAB — VITAMIN B12: Vitamin B-12: 409 pg/mL (ref 180–914)

## 2024-08-04 LAB — URINALYSIS, ROUTINE W REFLEX MICROSCOPIC
Bilirubin Urine: NEGATIVE
Glucose, UA: NEGATIVE mg/dL
Hgb urine dipstick: NEGATIVE
Ketones, ur: 20 mg/dL — AB
Nitrite: NEGATIVE
Protein, ur: 30 mg/dL — AB
Specific Gravity, Urine: 1.028 (ref 1.005–1.030)
pH: 5 (ref 5.0–8.0)

## 2024-08-04 LAB — HEPATITIS PANEL, ACUTE
HCV Ab: NONREACTIVE
Hep A IgM: NONREACTIVE
Hep B C IgM: NONREACTIVE
Hepatitis B Surface Ag: NONREACTIVE

## 2024-08-04 LAB — IRON AND TIBC
Iron: 55 ug/dL (ref 28–170)
Saturation Ratios: 21 % (ref 10.4–31.8)
TIBC: 262 ug/dL (ref 250–450)
UIBC: 206 ug/dL

## 2024-08-04 LAB — LIPASE, BLOOD: Lipase: 47 U/L (ref 11–51)

## 2024-08-04 LAB — GLUCOSE, CAPILLARY
Glucose-Capillary: 125 mg/dL — ABNORMAL HIGH (ref 70–99)
Glucose-Capillary: 139 mg/dL — ABNORMAL HIGH (ref 70–99)

## 2024-08-04 LAB — HIV ANTIBODY (ROUTINE TESTING W REFLEX): HIV Screen 4th Generation wRfx: NONREACTIVE

## 2024-08-04 LAB — FERRITIN: Ferritin: 130 ng/mL (ref 11–307)

## 2024-08-04 LAB — HEMOGLOBIN A1C
Hgb A1c MFr Bld: 6.3 % — ABNORMAL HIGH (ref 4.8–5.6)
Mean Plasma Glucose: 134.11 mg/dL

## 2024-08-04 LAB — C-REACTIVE PROTEIN: CRP: 1.3 mg/dL — ABNORMAL HIGH (ref <1.0)

## 2024-08-04 MED ORDER — ACETAMINOPHEN 325 MG PO TABS
650.0000 mg | ORAL_TABLET | ORAL | Status: DC | PRN
  Filled 2024-08-04: qty 2

## 2024-08-04 MED ORDER — ENOXAPARIN SODIUM 60 MG/0.6ML IJ SOSY
0.5000 mg/kg | PREFILLED_SYRINGE | INTRAMUSCULAR | Status: DC
  Administered 2024-08-04 – 2024-08-14 (×11): 52.5 mg via SUBCUTANEOUS
  Filled 2024-08-04 (×11): qty 0.6

## 2024-08-04 MED ORDER — SODIUM CHLORIDE 0.9 % IV SOLN
2.0000 g | Freq: Once | INTRAVENOUS | Status: AC
  Administered 2024-08-04: 2 g via INTRAVENOUS
  Filled 2024-08-04: qty 20

## 2024-08-04 MED ORDER — TIZANIDINE HCL 2 MG PO TABS: 4.0000 mg | ORAL_TABLET | Freq: Three times a day (TID) | ORAL | Status: DC

## 2024-08-04 MED ORDER — IOHEXOL 300 MG/ML  SOLN
100.0000 mL | Freq: Once | INTRAMUSCULAR | Status: AC | PRN
  Administered 2024-08-04: 100 mL via INTRAVENOUS

## 2024-08-04 MED ORDER — INSULIN ASPART 100 UNIT/ML IJ SOLN
0.0000 [IU] | INTRAMUSCULAR | Status: DC
  Administered 2024-08-04 – 2024-08-09 (×4): 1 [IU] via SUBCUTANEOUS
  Administered 2024-08-09: 2 [IU] via SUBCUTANEOUS
  Administered 2024-08-10 – 2024-08-12 (×6): 1 [IU] via SUBCUTANEOUS
  Filled 2024-08-04 (×7): qty 1
  Filled 2024-08-04: qty 2
  Filled 2024-08-04 (×5): qty 1

## 2024-08-04 MED ORDER — SODIUM CHLORIDE 0.9 % IV SOLN
2.0000 g | INTRAVENOUS | Status: DC
  Administered 2024-08-05: 08:00:00 2 g via INTRAVENOUS
  Filled 2024-08-04: qty 20

## 2024-08-04 MED ORDER — SODIUM CHLORIDE 0.9 % IV BOLUS
1000.0000 mL | Freq: Once | INTRAVENOUS | Status: AC
  Administered 2024-08-04: 1000 mL via INTRAVENOUS

## 2024-08-04 MED ORDER — ONDANSETRON HCL 4 MG/2ML IJ SOLN
4.0000 mg | Freq: Four times a day (QID) | INTRAMUSCULAR | Status: DC | PRN
  Administered 2024-08-06 – 2024-08-07 (×2): 4 mg via INTRAVENOUS
  Filled 2024-08-04 (×2): qty 2

## 2024-08-04 MED ORDER — MORPHINE SULFATE (PF) 2 MG/ML IV SOLN
1.0000 mg | INTRAVENOUS | Status: DC | PRN
  Administered 2024-08-05 – 2024-08-11 (×8): 1 mg via INTRAVENOUS
  Filled 2024-08-04 (×8): qty 1

## 2024-08-04 MED ORDER — METRONIDAZOLE 500 MG/100ML IV SOLN
500.0000 mg | Freq: Once | INTRAVENOUS | Status: AC
  Administered 2024-08-04: 500 mg via INTRAVENOUS
  Filled 2024-08-04: qty 100

## 2024-08-04 MED ORDER — SODIUM CHLORIDE 0.9% FLUSH
3.0000 mL | Freq: Two times a day (BID) | INTRAVENOUS | Status: DC
  Administered 2024-08-04 – 2024-08-14 (×17): 3 mL via INTRAVENOUS

## 2024-08-04 MED ORDER — METRONIDAZOLE 500 MG/100ML IV SOLN
500.0000 mg | Freq: Two times a day (BID) | INTRAVENOUS | Status: DC
  Administered 2024-08-04: 500 mg via INTRAVENOUS
  Filled 2024-08-04 (×2): qty 100

## 2024-08-04 MED ORDER — ONDANSETRON HCL 4 MG PO TABS: 4.0000 mg | ORAL_TABLET | Freq: Four times a day (QID) | ORAL | Status: DC | PRN

## 2024-08-04 MED ORDER — ENOXAPARIN SODIUM 40 MG/0.4ML IJ SOSY: 40.0000 mg | PREFILLED_SYRINGE | INTRAMUSCULAR | Status: DC

## 2024-08-04 MED ORDER — SODIUM CHLORIDE 0.9 % IV SOLN: INTRAVENOUS | Status: DC

## 2024-08-04 MED ORDER — DULOXETINE HCL 30 MG PO CPEP
60.0000 mg | ORAL_CAPSULE | Freq: Every day | ORAL | Status: DC
  Administered 2024-08-09 – 2024-08-15 (×7): 60 mg via ORAL
  Filled 2024-08-04 (×8): qty 2

## 2024-08-04 NOTE — Progress Notes (Signed)
° °  Brief Progress Note   _____________________________________________________________________________________________________________  Patient Name: Tonya Mueller Patient DOB: 06/17/1966 Date: @TODAY @      Data: Received vital signs, labs, and clinical notes.    Action: No action required at this time.    Response:  Downgraded to Med-surg. Awaiting bed assignment.  _____________________________________________________________________________________________________________  [facility] RN Expeditor Anely Spiewak [address] Please contact us  directly via secure chat (search for [facility]) or by calling us  at [phone] [facility]).

## 2024-08-04 NOTE — Progress Notes (Signed)
 PHARMACIST - PHYSICIAN COMMUNICATION  CONCERNING:  Enoxaparin  (Lovenox ) for DVT Prophylaxis    RECOMMENDATION: Patient was prescribed enoxaprin 40mg  q24 hours for VTE prophylaxis.   Filed Weights   08/04/24 0616  Weight: 103.9 kg (229 lb)    Body mass index is 43.27 kg/m.  Estimated Creatinine Clearance: 87 mL/min (by C-G formula based on SCr of 0.65 mg/dL).   Based on [facility] policy patient is candidate for enoxaparin  0.5mg /kg TBW SQ every 24 hours based on BMI being >30.  DESCRIPTION: Pharmacy has adjusted enoxaparin  dose per [facility] policy.  Patient is now receiving enoxaparin  52.5 mg every 24 hours    Ransom Blanch PGY-1 Pharmacy Resident  [facility]  08/04/2024 7:22 AM

## 2024-08-04 NOTE — ED Triage Notes (Signed)
 Pt arrives with c/o 3 weeks of upped abdominal pain, diarrhea. Worse after eating. She has seen her doctor twice about the same, most recently yesterday- she was given rx for simethecone, carafate, and bentyl. Took her meds without relief.

## 2024-08-04 NOTE — H&P (Signed)
 History and Physical    Patient: Tonya Mueller FMW:[id number] DOB: [birth year] DOA: 08/04/2024 DOS: the patient was seen and examined on 08/04/2024 PCP: [name], NP  Patient coming from: Home  Chief Complaint:  Chief Complaint  Patient presents with   Abdominal Pain   HPI: Tonya Mueller is a 58 y.o. female with medical history significant of diabetes mellitus 2 on Ozempic, GERD, NASH, recent diagnosis of sleep apnea now on CPAP, obesity with BMI 43, dyslipidemia, cervical radiculopathy/cervical spinal stenosis with history of prior injection therapies to treat pain.  Patient presented to the ED complaining of 3 weeks of upper abdominal pain with diarrhea worse after eating.  Has seen her primary care physician twice.  Labs were obtained and were unremarkable according to the patient except for a mildly elevated lipase.  Most recent exam she was given symptomatic treatment recommendations for simethicone, prescribed Carafate and Bentyl.  No improvement in symptoms.  Because of the symptoms she has presented to the ED for treatment.  Upon evaluation in the ED she was afebrile, normotensive but tachycardic with an initial heart rate of 120 bpm.  Labs revealed mildly elevated calcium  at 8.7 with a normal albumin, mildly elevated ALT with normal total bilirubin, normal lactic acid, leukocytosis 21,500 although differential was not obtained, hemoglobin 15.8, abnormal urinalysis not consistent with UTI.  CT abdomen and pelvis revealed diffuse moderate ascites which is stable from prior CT but increased when compared with prior abdominal ultrasound from 2021.  There was some distal small bowel wall thickening similar to previous exam and likely reactive due to ascites although underlying inflammatory bowel disease could not be officially ruled out.  Hospitalist service has been asked to evaluate the patient for admission.  Upon my evaluation of the patient she states she has not had any nausea or  emesis, no fevers or chills.  She has been alternating between watery stools and incompletely formed blob like stools.  Because of her symptoms over the past week she has not taken her Ozempic per recommendation of her physician.  In further discussion with the patient she stated that about 1 month ago she started an OTC GLP-1 patch called berberine because she had not been losing any weight on the Ozempic alone.   Review of Systems: As mentioned in the history of present illness. All other systems reviewed and are negative. Past Medical History:  Diagnosis Date   Allergic rhinitis    Angio-edema    Cervical radiculopathy    Chronic sinusitis    COVID-19    Elevated liver enzymes    Fatty liver disease, nonalcoholic    GERD (gastroesophageal reflux disease)    Peroneal tendonitis, left    Plantar fasciitis    Situational depression    Trigger finger of left  hand    Type 2 diabetes mellitus (HCC)    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    Past Surgical History:  Procedure Laterality Date   ANTERIOR CERVICAL DECOMP/DISCECTOMY FUSION N/A 04/25/2023   Procedure: C6-7 ANTERIOR CERVICAL DISCECTOMY AND FUSION (GLOBUS FORGE);  Surgeon: Claudene Penne ORN, MD;  Location: [facility] ORS;  Service: Neurosurgery;  Laterality: N/A;   BREAST EXCISIONAL BIOPSY Left 2004-2005   benign   BREAST SURGERY     CESAREAN SECTION  2006   CHOLECYSTECTOMY     COLONOSCOPY N/A 12/28/2023   Procedure: COLONOSCOPY;  Surgeon: Onita Elspeth Sharper, DO;  Location: [facility] ENDOSCOPY;  Service: Gastroenterology;  Laterality: N/A;  DM   ETHMOIDECTOMY  Left 05/24/2022   Procedure: ETHMOIDECTOMY- TOTAL WITH FRONTAL SINUS EXPLORATION;  Surgeon: Blair Mt, MD;  Location: [facility];  Service: ENT;  Laterality: Left;  Diabetic   GALLBLADDER SURGERY     IMAGE GUIDED SINUS SURGERY Left 05/24/2022   Procedure: IMAGE GUIDED SINUS SURGERY;  Surgeon: Blair Mt, MD;  Location: [facility];  Service: ENT;  Laterality: Left;   PLACED DISK ON OR CHARGE NURSE DESK 8-23 KP   MAXILLARY ANTROSTOMY Left 05/24/2022   Procedure: MAXILLARY ANTROSTOMY WITH TISSUE REMOVAL;  Surgeon: Blair Mt, MD;  Location: [facility] SUR[facility];  Service: ENT;  Laterality: Left;   POLYPECTOMY  12/28/2023   Procedure: POLYPECTOMY, INTESTINE;  Surgeon: Onita Elspeth Sharper, DO;  Location: [facility] ENDOSCOPY;  Service: Gastroenterology;;   Social History:  reports that she has never smoked. She has never used smokeless tobacco. She reports that she does not drink alcohol and does not use drugs.  Allergies[1]  Family History  Problem Relation Age of Onset   Breast cancer Neg Hx     Prior to Admission medications  Medication Sig Start Date End Date Taking? Authorizing Provider  dicyclomine (BENTYL) 10 MG capsule Take 10 mg by mouth 4 (four) times daily as needed. 08/02/24  Yes [provider]  DULoxetine  (CYMBALTA ) 60 MG capsule Take 60 mg by mouth daily.   Yes [provider]  EPINEPHrine  0.3 mg/0.3 mL IJ SOAJ injection Inject 0.3 mg into the muscle as needed. 10/20/20  Yes [provider]  fluticasone (FLONASE) 50 MCG/ACT nasal spray Place into both nostrils daily.   Yes [provider]  ipratropium (ATROVENT) 0.03 % nasal spray Place 2 sprays into both nostrils every 12 (twelve) hours.   Yes [provider]  levocetirizine (XYZAL) 5 MG tablet Take 5 mg by mouth every evening.   Yes [provider]  omeprazole (PRILOSEC) 20 MG capsule Take 20 mg by mouth daily.   Yes [provider]  OZEMPIC, 1 MG/DOSE, 4 MG/3ML SOPN Inject 1 mg into the skin once a week.   Yes [provider]  senna (SENOKOT) 8.6 MG TABS tablet Take 1 tablet (8.6 mg total) by mouth daily as needed for mild constipation. 04/26/23  Yes Gregory Edsel Ruth, PA  sucralfate (CARAFATE) 1 g tablet Take 1 g by mouth 4 (four) times daily -  with meals and at bedtime. 08/02/24 08/02/25 Yes [provider]   tiZANidine  (ZANAFLEX ) 4 MG tablet TAKE 1 TABLET BY MOUTH 3 TIMES DAILY. 05/22/23  Yes Gregory Edsel Ruth, PA  triamcinolone  cream (KENALOG ) 0.1 % Apply 1 Application topically 2 (two) times daily as needed. 11/10/22  Yes [provider]  atorvastatin  (LIPITOR) 20 MG tablet Take 20 mg by mouth daily. Patient not taking: Reported on 08/04/2024    [provider]  gabapentin  (NEURONTIN ) 100 MG capsule Take 100 mg by mouth at bedtime. 400 mg at bedtime Patient not taking: Reported on 08/04/2024    [provider]    Physical Exam: Vitals:   08/04/24 0003 08/04/24 0005 08/04/24 0324 08/04/24 0616  BP: (!) 115/95  127/87 (!) 144/83  Pulse:  (!) 120 99 90  Resp: 18  18 18   Temp: 98.1 F (36.7 C)  97.8 F (36.6 C) 98 F (36.7 C)  TempSrc: Oral  Oral   SpO2: 95%  97% 96%  Weight:    103.9 kg  Height:    5' 1 (1.549 m)   Constitutional: NAD, calm, comfortable Eyes: PERRL, lids  and conjunctivae normal ENMT: Mucous membranes are moist. Posterior pharynx clear of any exudate or lesions.Normal dentition.  Neck: normal, supple, no masses, no thyromegaly Respiratory: clear to auscultation bilaterally, no wheezing, no crackles. Normal respiratory effort. No accessory muscle use.  Cardiovascular: Regular rate and rhythm, no murmurs / rubs / gallops. No extremity edema. 2+ pedal pulses. No carotid bruits.  Abdomen: no tenderness, no masses palpated. No hepatosplenomegaly. Bowel sounds positive.  Musculoskeletal: no clubbing / cyanosis. No joint deformity upper and lower extremities. Good ROM, no contractures. Normal muscle tone.  Skin: no rashes, lesions, ulcers. No induration Neurologic: CN 2-12 grossly intact. Sensation intact, DTR normal. Strength 5/5 x all 4 extremities.  Psychiatric: Normal judgment and insight. Alert and oriented x 3. Normal mood.     Data Reviewed:  Sodium 138, potassium 4.0, glucose 160, BUN 16, creatinine 0.65, calcium  8.7 with an albumin of  3.9, anion gap normal, alkaline phosphatase 188, LFTs normal otherwise except for ALT 64  WBC 21,500, hemoglobin 15.6, platelets 230,000  Imaging as documented above   Assessment and Plan: SIRS physiology secondary to dehydration Abdominal pain with diarrhea ongoing x 3 weeks Presents with several weeks of abdominal discomfort and diarrhea Outpatient labs from 12/8 revealed abnormal urinalysis that was slightly cloudy in appearance with negative nitrate negative leukocyte esterase but positive for moderate bacteria and squamous epithelials is not consistent with UTI.  Patient also reporting upper abdominal pain without suprapubic pain.  H. pylori and C. difficile PCR were also negative at that time. This presentation was found to have leukocytosis without fever, normal lactic acid and mildly elevated ALT without an obstructive pattern otherwise therefore sepsis has been ruled out at this juncture Leukocytosis could be secondary to volume depletion but given ascites (see evaluation below) as well as diarrhea will treat with Rocephin  and Flagyl .   Outpatient fecal calprotectin was elevated at 210 on 12/10 which indicates active inflammation and the possibility of underlying inflammatory bowel disease.  Radiologist documented concern over possible inflammatory bowel disease upon interpretation of recent CT abdomen pelvis.  Check CRP  Initial pulse was 120 bpm, hemoglobin 15.6 with baseline around 13.  Urinalysis also abnormal. All of this appears consistent with volume depletion. Patient reports initiation of OTC GLP-1 patch 1 month ago and this could be contributing to symptoms noting she had continued to use her Ozempic during use of the patch. Continue to hold Ozempic.  OTC GLP-1 patch brand name is berberine and contains high doses of vitamin B12 8.5 mg along with L-glutamine and chromium as well as other plant-based supplements.   Continue supportive care with antiemetics and IV fluids; follow  labs Treat abdominal pain with both narcotic and nonnarcotic pain medications Denies dark or bloody stools or emesis and has not had any nausea or vomiting  NASH New onset ascites ? SBP Patient has a history of fatty liver disease reported in PCP notes Interestingly, imaging on CT this admission shows normal-appearing liver Since admission an abdominal ultrasound has been obtained and confirms moderate volume ascites in all 4 quadrants-given elevated WBC cannot exclude SBP so we will continue antibiotics as above Unclear how potential drug to drug interactions with Ozempic/OTC GLP-1 patch and statin could be contributing.  Ozempic and statin on hold at this juncture  Diabetes mellitus 2 on Ozempic Ozempic on hold as above Follow CBGs and provide SSI Obtain hemoglobin A1c. Hemoglobin A1c in July was 6.8  Dyslipidemia Hold preadmission Lipitor given mild transaminitis and unexplained ascites  Mild sleep apnea on CPAP Continue CPAP on home settings  Class III obesity BMI 43 Patient has been unsuccessful in weight reduction strategies.  She reports difficulty in managing ingestion of sweets especially chocolates and cakes.  She states with the addition of the GLP-1 patch she was able to cut back to 1 piece of chocolate as opposed to eating an entire bag of chocolate    Advance Care Planning:   Code Status: Full Code   VTE prophylaxis: Lovenox   Consults: None  Family Communication: Daughter at bedside  Severity of Illness: The appropriate patient status for this patient is OBSERVATION. Observation status is judged to be reasonable and necessary in order to provide the required intensity of service to ensure the patient's safety. The patient's presenting symptoms, physical exam findings, and initial radiographic and laboratory data in the context of their medical condition is felt to place them at decreased risk for further clinical deterioration. Furthermore, it is anticipated that  the patient will be medically stable for discharge from the hospital within 2 midnights of admission.   Author: Isaiah Lever, NP 08/04/2024 7:22 AM  For on call review [email or web address].      [1]  Allergies Allergen Reactions   Cortisone Other (See Comments) and Swelling    Patient experienced severe stomach pains and bloating; 05/24/22: pt has had an injection recently without issues  Liver failure  Liver failure - subacute & reversible; diagnosed after she had severe bloating & stomach pain after numerous cortisone injections over a 6 month timeframe. Has had isolated cortisone injections which were better spaced out without these difficulties & has tolerated oral steroids without difficulty in short courses as well. This reaction only occurred after the multiple injections within that 6 months.   Liver failure - subacute & reversible; diagnosed after she had severe bloating & stomach pain after numerous cortisone injections over a 6 month timeframe. Has had isolated cortisone injections which were better spaced out without these difficulties & has tolerated oral steroids without difficulty in short courses as well. This reaction only occurred after the multiple injections within that 6 months.

## 2024-08-04 NOTE — ED Provider Notes (Signed)
 [facility] Provider Note    Event Date/Time   First MD Initiated Contact with Patient 08/04/24 0251     (approximate)   History   Abdominal Pain   HPI  Tonya Mueller is a 58 y.o. female who presents to the ED for evaluation of Abdominal Pain   Reviewed PCP visit from 12/8.  History of GERD, DM Reports that she has seen GI in the past for routine screening colonoscopies but has never required paracentesis and has no known history of liver disease.  Does report her mother died of cirrhosis  Patient presents due to 2 weeks of generalized abdominal discomfort, intermittent diarrhea and new bloating sensation.  No fevers or urinary changes.  There is antibiotics but reports intermittent diarrhea, estimating about 1-3 episodes per day.   Physical Exam   Triage Vital Signs: ED Triage Vitals  Encounter Vitals Group     BP 08/04/24 0003 (!) 115/95     Girls Systolic BP Percentile --      Girls Diastolic BP Percentile --      Boys Systolic BP Percentile --      Boys Diastolic BP Percentile --      Pulse Rate 08/04/24 0005 (!) 120     Resp 08/04/24 0003 18     Temp 08/04/24 0003 98.1 F (36.7 C)     Temp Source 08/04/24 0003 Oral     SpO2 08/04/24 0003 95 %     Weight --      Height --      Head Circumference --      Peak Flow --      Pain Score 08/04/24 0003 6     Pain Loc --      Pain Education --      Exclude from Growth Chart --     Most recent vital signs: Vitals:   08/04/24 0003 08/04/24 0005  BP: (!) 115/95   Pulse:  (!) 120  Resp: 18   Temp: 98.1 F (36.7 C)   SpO2: 95%     General: Awake, no distress.  Ill-appearing, no scleral icterus CV:  Good peripheral perfusion.  Resp:  Normal effort.  Abd:  Moderately distended, not tense, minimal diffuse tenderness does not seem peritonitic MSK:  No deformity noted.  Neuro:  No focal deficits appreciated. Other:     ED Results / Procedures / Treatments   Labs (all labs ordered  are listed, but only abnormal results are displayed) Labs Reviewed  COMPREHENSIVE METABOLIC PANEL WITH GFR - Abnormal; Notable for the following components:      Result Value   Glucose, Bld 160 (*)    Calcium  8.7 (*)    Total Protein 6.4 (*)    ALT 64 (*)    Alkaline Phosphatase 188 (*)    All other components within normal limits  CBC - Abnormal; Notable for the following components:   WBC 21.5 (*)    RBC 5.48 (*)    Hemoglobin 15.6 (*)    HCT 48.2 (*)    All other components within normal limits  URINALYSIS, ROUTINE W REFLEX MICROSCOPIC - Abnormal; Notable for the following components:   Color, Urine AMBER (*)    APPearance CLOUDY (*)    Ketones, ur 20 (*)    Protein, ur 30 (*)    Leukocytes,Ua TRACE (*)    Bacteria, UA RARE (*)    All other components within normal limits  CULTURE, BLOOD (ROUTINE X 2)  CULTURE, BLOOD (ROUTINE X 2)  C DIFFICILE QUICK SCREEN W PCR REFLEX    LIPASE, BLOOD  HEPATITIS PANEL, ACUTE  LACTIC ACID, PLASMA    EKG Sinus tachycardia with rate of 118 bpm.  Normal axis and intervals.  No clear signs of acute ischemia.  RADIOLOGY CT abdomen/pelvis interpreted by me with ascites  Official radiology report(s): CT ABDOMEN PELVIS W CONTRAST Result Date: 08/04/2024 EXAM: CT ABDOMEN AND PELVIS WITH CONTRAST 08/04/2024 02:14:44 AM TECHNIQUE: CT of the abdomen and pelvis was performed with the administration of 100 mL of iohexol  (OMNIPAQUE ) 300 MG/ML solution. Multiplanar reformatted images are provided for review. Automated exposure control, iterative reconstruction, and/or weight-based adjustment of the mA/kV was utilized to reduce the radiation dose to as low as reasonably achievable. COMPARISON: 10/24/2013. CLINICAL HISTORY: upper abd pain, diarrhea, HR 120, WBC 21 FINDINGS: LOWER CHEST: Lung bases are free from acute infiltrate or sizable effusion. LIVER: The liver is within normal limits. GALLBLADDER AND BILE DUCTS: The gallbladder has been surgically  removed. No biliary ductal dilatation. SPLEEN: The spleen is within normal limits. PANCREAS: The pancreas is within normal limits. ADRENAL GLANDS: The adrenal glands are unremarkable. KIDNEYS, URETERS AND BLADDER: The kidneys are unremarkable. No stones in the kidneys or ureters. No hydronephrosis. The bladder is decompressed. GI AND BOWEL: Thickening of the distal esophagus is noted, likely related to reflux. This is stable from the prior exam. The stomach is unremarkable. Distal small bowel shows some wall thickening .this is similar to that seen on the prior exam from 2015 and may simply be reactive to the underlying ascites. Possibility of underlying inflammatory bowel disease deserves consideration. No fistulization is seen. No obstructive or inflammatory changes of the colon are seen. The appendix is within normal limits. PERITONEUM AND RETROPERITONEUM: Diffuse moderate ascites is noted throughout the abdomen. No free air. VASCULATURE: Aorta is normal in caliber. LYMPH NODES: No lymphadenopathy. REPRODUCTIVE ORGANS: The uterus is within normal limits. No definitive ovarian lesion is seen. BONES AND SOFT TISSUES: No acute osseous abnormality. No focal soft tissue abnormality. IMPRESSION: 1. Diffuse moderate ascites. This is stable from the prior CT examination but increased when compared with the prior ultrasound from 2021. 2. Distal small bowel wall thickening, similar to prior exam, possibly reactive to ascites; underlying inflammatory bowel disease could be considered. No fistulization. The overall appearance is stable from the prior CT examination. Electronically signed by: Oneil Devonshire MD 08/04/2024 02:26 AM EST RP Workstation: [id number]    PROCEDURES and INTERVENTIONS:  .Critical Care  Performed by: Claudene Rover, MD Authorized by: Claudene Rover, MD   Critical care provider statement:    Critical care time (minutes):  30   Critical care time was exclusive of:  Separately billable procedures and  treating other patients   Critical care was necessary to treat or prevent imminent or life-threatening deterioration of the following conditions:  Sepsis   Critical care was time spent personally by me on the following activities:  Development of treatment plan with patient or surrogate, discussions with consultants, evaluation of patient's response to treatment, examination of patient, ordering and review of laboratory studies, ordering and review of radiographic studies, ordering and performing treatments and interventions, pulse oximetry, re-evaluation of patient's condition and review of old charts   Medications  cefTRIAXone  (ROCEPHIN ) 2 g in sodium chloride  0.9 % 100 mL IVPB (has no administration in time range)  metroNIDAZOLE  (FLAGYL ) IVPB 500 mg (has no administration in time range)  sodium chloride  0.9 % bolus 1,000 mL (  has no administration in time range)  iohexol  (OMNIPAQUE ) 300 MG/ML solution 100 mL (100 mLs Intravenous Contrast Given 08/04/24 0206)     IMPRESSION / MDM / ASSESSMENT AND PLAN / ED COURSE  I reviewed the triage vital signs and the nursing notes.  Differential diagnosis includes, but is not limited to, enteritis, C. difficile, hepatitis, NASH or other cirrhosis  {Patient presents with symptoms of an acute illness or injury that is potentially life-threatening.  Patient presents with 2 weeks of abdominal distention, diarrhea and discomfort.  Generally well-appearing but is tachycardic with a significant leukocytosis.   Metabolic panel with mild transaminitis with ALT 64, ALP 188.  Normal bilirubin and lipase.  CT, as above  Will draw cultures and provide ceftriaxone /Flagyl  for coverage of SBP and possible enteritis.  Add on hepatitis panel, C. difficile if she provides a stool sample.  Will consult medicine for admission to have GI evaluate the patient, likely IR to perform paracentesis and ascites evaluation      FINAL CLINICAL IMPRESSION(S) / ED DIAGNOSES    Final diagnoses:  Generalized abdominal pain  Other ascites  Enteritis     Rx / DC Orders   ED Discharge Orders     None        Note:  This document was prepared using Dragon voice recognition software and may include unintentional dictation errors.   Claudene Rover, MD 08/04/24 [phone]

## 2024-08-04 NOTE — ED Triage Notes (Incomplete)
 Pt arrives with c/o 3 weeks of upped abdominal pain, diarrhea. She has seen her doctor twice about the same, most recently yesterday- she was given rx for simethecone, carafate, and bentyl. Took her meds without relief.

## 2024-08-05 LAB — CBC
HCT: 39.4 % (ref 36.0–46.0)
Hemoglobin: 12.9 g/dL (ref 12.0–15.0)
MCH: 28.7 pg (ref 26.0–34.0)
MCHC: 32.7 g/dL (ref 30.0–36.0)
MCV: 87.6 fL (ref 80.0–100.0)
Platelets: 171 K/uL (ref 150–400)
RBC: 4.5 MIL/uL (ref 3.87–5.11)
RDW: 13.2 % (ref 11.5–15.5)
WBC: 16.4 K/uL — ABNORMAL HIGH (ref 4.0–10.5)
nRBC: 0 % (ref 0.0–0.2)

## 2024-08-05 LAB — COMPREHENSIVE METABOLIC PANEL WITH GFR
ALT: 40 U/L (ref 0–44)
AST: 22 U/L (ref 15–41)
Albumin: 3.3 g/dL — ABNORMAL LOW (ref 3.5–5.0)
Alkaline Phosphatase: 143 U/L — ABNORMAL HIGH (ref 38–126)
Anion gap: 9 (ref 5–15)
BUN: 9 mg/dL (ref 6–20)
CO2: 24 mmol/L (ref 22–32)
Calcium: 8.3 mg/dL — ABNORMAL LOW (ref 8.9–10.3)
Chloride: 107 mmol/L (ref 98–111)
Creatinine, Ser: 0.47 mg/dL (ref 0.44–1.00)
GFR, Estimated: >60 mL/min (ref >60)
Glucose, Bld: 111 mg/dL — ABNORMAL HIGH (ref 70–99)
Potassium: 3.7 mmol/L (ref 3.5–5.1)
Sodium: 140 mmol/L (ref 135–145)
Total Bilirubin: 0.4 mg/dL (ref 0.0–1.2)
Total Protein: 5.2 g/dL — ABNORMAL LOW (ref 6.5–8.1)

## 2024-08-05 LAB — GLUCOSE, CAPILLARY
Glucose-Capillary: 106 mg/dL — ABNORMAL HIGH (ref 70–99)
Glucose-Capillary: 108 mg/dL — ABNORMAL HIGH (ref 70–99)
Glucose-Capillary: 112 mg/dL — ABNORMAL HIGH (ref 70–99)
Glucose-Capillary: 116 mg/dL — ABNORMAL HIGH (ref 70–99)
Glucose-Capillary: 118 mg/dL — ABNORMAL HIGH (ref 70–99)
Glucose-Capillary: 126 mg/dL — ABNORMAL HIGH (ref 70–99)

## 2024-08-05 MED ORDER — PEG 3350-KCL-NA BICARB-NACL 420 G PO SOLR
4000.0000 mL | Freq: Once | ORAL | Status: AC
  Administered 2024-08-05: 18:00:00 4000 mL via ORAL
  Filled 2024-08-05: qty 4000

## 2024-08-05 MED ORDER — SODIUM CHLORIDE 0.9 % IV SOLN: INTRAVENOUS | Status: AC

## 2024-08-05 NOTE — Progress Notes (Signed)
 1      PROGRESS NOTE    Tonya Mueller  FMW:[id number] DOB: 07-23-1966 DOA: 08/04/2024 PCP: [name], NP   Brief Narrative:   58 y.o. female with medical history significant of diabetes mellitus 2 on Ozempic, GERD, NASH, recent diagnosis of sleep apnea now on CPAP, obesity with BMI 43, dyslipidemia, cervical radiculopathy/cervical spinal stenosis with history of prior injection therapies to treat pain.  Patient presented to the ED complaining of 3 weeks of upper abdominal pain with diarrhea worse after eating   12/15: GI consult   Assessment & Plan:   Principal Problem:   Sepsis due to undetermined organism [facility]) Active Problems:   SIRS (systemic inflammatory response syndrome) (HCC)   Abdominal pain, belching, gas and diarrhea NASH New onset ascites  SBP ruled out - Every time she eats she starts having more abdominal distention belching, gas and loose stool Reports having colonoscopy in May 2025 by Dr. Onita which did not show any acute pathology she wasn't losing weight on her Ozempic so she started OTC GLP-1 Berberic patch (all vitamins plus pomegranate extract- the only dosages listed are: Methylcobalamin (B12) 8.25 mg, L-Glutamine 3.5 mg and chromium 35 mcg) - she says she stopped both on Nov 29th. saw her PCP on 12/8 & again on 12/12 due to ongoing above symptoms - PCP requested CT and GI eval but no symptom relief so came to the emergency department - GI consult.  Dr. Jinny aware Patient has a history of fatty liver disease reported in PCP notes - CT this admission shows normal-appearing liver Since admission an abdominal ultrasound has been obtained and confirms moderate volume ascites in all 4 quadrants-considering diffuse edema this cannot be tapped Unclear how potential drug to drug interactions with Ozempic/OTC GLP-1 patch and statin could be contributing.  Ozempic and statin on hold at this juncture -Currently on clear liquid diet as she is not able to tolerate  food   Diabetes mellitus 2 on Ozempic Ozempic on hold as above Follow CBGs and provide SSI hemoglobin A1c 6.3 Hemoglobin A1c in July was 6.8   Dyslipidemia Hold preadmission Lipitor given mild transaminitis and unexplained ascites   Mild sleep apnea on CPAP Continue CPAP on home settings   Class III obesity BMI 43 Patient has been unsuccessful in weight reduction strategies.  She reports difficulty in managing ingestion of sweets especially chocolates and cakes.  She states with the addition of the GLP-1 patch she was able to cut back to 1 piece of chocolate as opposed to eating an entire bag of chocolate   DVT prophylaxis: (Lovenox       Code Status: (Full code Family Communication: Daughter updated at bedside Disposition Plan: (Possible discharge in next 1 to 2 days depending on clinical condition and GI workup   Consultants:  GI    Subjective:  She is complaining of bloating and abdominal distention every time she tries to eat something.  Daughter at bedside  Objective: Vitals:   08/04/24 1900 08/04/24 2002 08/05/24 0423 08/05/24 0739  BP: (!) 143/81 128/74 139/82 115/72  Pulse: 91 96 97 (!) 102  Resp: 17 18 15 16   Temp:  98.6 F (37 C) 98.3 F (36.8 C) 98.4 F (36.9 C)  TempSrc:      SpO2: 97% 96% 95% 96%  Weight:      Height:        Intake/Output Summary (Last 24 hours) at 08/05/2024 1230 Last data filed at 08/05/2024 1217 Gross per 24  hour  Intake 2715.34 ml  Output --  Net 2715.34 ml   Filed Weights   08/04/24 0616  Weight: 103.9 kg    Examination:  General exam: Appears calm and comfortable  Respiratory system: Clear to auscultation. Respiratory effort normal. Cardiovascular system: S1 & S2 heard, RRR. No murmurs, pedal edema Gastrointestinal system: Abdomen is mildly distended, generalized tenderness Central nervous system: Alert and oriented. No focal neurological deficits. Extremities: Symmetric 5 x 5 power. Skin: No rashes, lesions or  ulcers Psychiatry: Judgement and insight appear normal. Mood & affect appropriate.     Data Reviewed: I have personally reviewed following labs and imaging studies  CBC: Recent Labs  Lab 08/04/24 0007 08/05/24 0511  WBC 21.5* 16.4*  HGB 15.6* 12.9  HCT 48.2* 39.4  MCV 88.0 87.6  PLT 230 171   Basic Metabolic Panel: Recent Labs  Lab 08/04/24 0007 08/05/24 0511  NA 138 140  K 4.0 3.7  CL 99 107  CO2 27 24  GLUCOSE 160* 111*  BUN 16 9  CREATININE 0.65 0.47  CALCIUM  8.7* 8.3*   GFR: Estimated Creatinine Clearance: 87 mL/min (by C-G formula based on SCr of 0.47 mg/dL). Liver Function Tests: Recent Labs  Lab 08/04/24 0007 08/05/24 0511  AST 33 22  ALT 64* 40  ALKPHOS 188* 143*  BILITOT 0.7 0.4  PROT 6.4* 5.2*  ALBUMIN 3.9 3.3*   Recent Labs  Lab 08/04/24 0007  LIPASE 47    HbA1C: Recent Labs    08/04/24 0915  HGBA1C 6.3*   CBG: Recent Labs  Lab 08/04/24 2218 08/05/24 0043 08/05/24 0453 08/05/24 0740 08/05/24 1133  GLUCAP 139* 118* 112* 106* 108*    Anemia Panel: Recent Labs    08/04/24 0915  VITAMINB12 409  FOLATE 9.9  FERRITIN 130  TIBC 262  IRON 55  RETICCTPCT 1.6   Sepsis Labs: Recent Labs  Lab 08/04/24 0410  LATICACIDVEN 0.7    Recent Results (from the past 240 hours)  Blood culture (routine x 2)     Status: None (Preliminary result)   Collection Time: 08/04/24  3:56 AM   Specimen: BLOOD  Result Value Ref Range Status   Specimen Description BLOOD BLOOD LEFT HAND  Final   Special Requests   Final    BOTTLES DRAWN AEROBIC AND ANAEROBIC Blood Culture results may not be optimal due to an inadequate volume of blood received in culture bottles   Culture   Final    NO GROWTH 1 DAY Performed at [facility] [address] [place], [place] [address]    Report Status PENDING  Incomplete  Blood culture (routine x 2)     Status: None (Preliminary result)   Collection Time: 08/04/24  4:06 AM   Specimen: BLOOD  Result  Value Ref Range Status   Specimen Description BLOOD BLOOD RIGHT HAND  Final   Special Requests   Final    BOTTLES DRAWN AEROBIC AND ANAEROBIC Blood Culture results may not be optimal due to an inadequate volume of blood received in culture bottles   Culture   Final    NO GROWTH 1 DAY Performed at [facility], [address] [place], [place] [address]    Report Status PENDING  Incomplete  C Difficile Quick Screen w PCR reflex     Status: None   Collection Time: 08/04/24  4:36 AM   Specimen: STOOL  Result Value Ref Range Status   C Diff antigen NEGATIVE NEGATIVE Final   C  Diff toxin NEGATIVE NEGATIVE Final   C Diff interpretation No C. difficile detected.  Final    Comment: Performed at [facility], [address] [place], [place] [address]  Gastrointestinal Panel by PCR , Stool     Status: None   Collection Time: 08/04/24  4:36 AM   Specimen: STOOL  Result Value Ref Range Status   Campylobacter species NOT DETECTED NOT DETECTED Final   Plesimonas shigelloides NOT DETECTED NOT DETECTED Final   Salmonella species NOT DETECTED NOT DETECTED Final   Yersinia enterocolitica NOT DETECTED NOT DETECTED Final   Vibrio species NOT DETECTED NOT DETECTED Final   Vibrio cholerae NOT DETECTED NOT DETECTED Final   Enteroaggregative E coli (EAEC) NOT DETECTED NOT DETECTED Final   Enteropathogenic E coli (EPEC) NOT DETECTED NOT DETECTED Final   Enterotoxigenic E coli (ETEC) NOT DETECTED NOT DETECTED Final   Shiga like toxin producing E coli (STEC) NOT DETECTED NOT DETECTED Final   Shigella/Enteroinvasive E coli (EIEC) NOT DETECTED NOT DETECTED Final   Cryptosporidium NOT DETECTED NOT DETECTED Final   Cyclospora cayetanensis NOT DETECTED NOT DETECTED Final   Entamoeba histolytica NOT DETECTED NOT DETECTED Final   Giardia lamblia NOT DETECTED NOT DETECTED Final   Adenovirus F40/41 NOT DETECTED NOT DETECTED Final   Astrovirus NOT DETECTED NOT DETECTED Final   Norovirus GI/GII  NOT DETECTED NOT DETECTED Final   Rotavirus A NOT DETECTED NOT DETECTED Final   Sapovirus (I, II, IV, and V) NOT DETECTED NOT DETECTED Final    Comment: Performed at [facility], [address], [place], [place] [address]         Radiology Studies: US  ASCITES (ABDOMEN LIMITED) Result Date: 08/04/2024 CLINICAL DATA:  Ascites. EXAM: LIMITED ABDOMEN ULTRASOUND FOR ASCITES TECHNIQUE: Limited ultrasound survey for ascites was performed in all four abdominal quadrants. COMPARISON:  CT scan from earlier same day FINDINGS: Moderate volume ascites in all 4 quadrants. IMPRESSION: Positive for ascites. Electronically Signed   By: Camellia Candle M.D.   On: 08/04/2024 08:35   CT ABDOMEN PELVIS W CONTRAST Result Date: 08/04/2024 EXAM: CT ABDOMEN AND PELVIS WITH CONTRAST 08/04/2024 02:14:44 AM TECHNIQUE: CT of the abdomen and pelvis was performed with the administration of 100 mL of iohexol  (OMNIPAQUE ) 300 MG/ML solution. Multiplanar reformatted images are provided for review. Automated exposure control, iterative reconstruction, and/or weight-based adjustment of the mA/kV was utilized to reduce the radiation dose to as low as reasonably achievable. COMPARISON: 10/24/2013. CLINICAL HISTORY: upper abd pain, diarrhea, HR 120, WBC 21 FINDINGS: LOWER CHEST: Lung bases are free from acute infiltrate or sizable effusion. LIVER: The liver is within normal limits. GALLBLADDER AND BILE DUCTS: The gallbladder has been surgically removed. No biliary ductal dilatation. SPLEEN: The spleen is within normal limits. PANCREAS: The pancreas is within normal limits. ADRENAL GLANDS: The adrenal glands are unremarkable. KIDNEYS, URETERS AND BLADDER: The kidneys are unremarkable. No stones in the kidneys or ureters. No hydronephrosis. The bladder is decompressed. GI AND BOWEL: Thickening of the distal esophagus is noted, likely related to reflux. This is stable from the prior exam. The stomach is unremarkable. Distal small  bowel shows some wall thickening .this is similar to that seen on the prior exam from 2015 and may simply be reactive to the underlying ascites. Possibility of underlying inflammatory bowel disease deserves consideration. No fistulization is seen. No obstructive or inflammatory changes of the colon are seen. The appendix is within normal limits. PERITONEUM AND RETROPERITONEUM: Diffuse moderate ascites is noted throughout  the abdomen. No free air. VASCULATURE: Aorta is normal in caliber. LYMPH NODES: No lymphadenopathy. REPRODUCTIVE ORGANS: The uterus is within normal limits. No definitive ovarian lesion is seen. BONES AND SOFT TISSUES: No acute osseous abnormality. No focal soft tissue abnormality. IMPRESSION: 1. Diffuse moderate ascites. This is stable from the prior CT examination but increased when compared with the prior ultrasound from 2021. 2. Distal small bowel wall thickening, similar to prior exam, possibly reactive to ascites; underlying inflammatory bowel disease could be considered. No fistulization. The overall appearance is stable from the prior CT examination. Electronically signed by: Oneil Devonshire MD 08/04/2024 02:26 AM EST RP Workstation: [id number]        Scheduled Meds:  DULoxetine   60 mg Oral Daily   enoxaparin  (LOVENOX ) injection  0.5 mg/kg Subcutaneous Q24H   insulin  aspart  0-9 Units Subcutaneous Q4H   sodium chloride  flush  3 mL Intravenous Q12H   Continuous Infusions:  sodium chloride  100 mL/hr at 08/05/24 0751     LOS: 1 day    Time spent: 35 minutes    Cresencio Fairly, MD [facility] Pager [phone]  If 7PM-7AM, please contact night-coverage  08/05/2024, 12:30 PM

## 2024-08-05 NOTE — Plan of Care (Signed)

## 2024-08-05 NOTE — Discharge Instructions (Signed)

## 2024-08-05 NOTE — Consult Note (Signed)
 Tonya Copping, MD [facility]  [address] [place], [place] [address] Phone: [phone] Fax : [phone]  Consultation  Referring Provider:     Dr. Maree Primary Care Physician:  [name], NP Primary Gastroenterologist:  Dr. Onita         Reason for Consultation:     Abdominal pain, belching, gas and diarrhea  Date of Admission:  08/04/2024 Date of Consultation:  08/05/2024         HPI:   Tonya Mueller is a 58 y.o. female who presented to the emergency department with abdominal pain.  Patient has been seen by Dr. Onita in the past for a screening colonoscopy back in August of this year.  The patient came in reporting 2 weeks of generalized abdominal pain with an intermittent diarrhea and bloating.  She had reported the diarrhea to be 1-3 episodes per day. The patient does have a history of diabetes melitis type II and on Ozempic in the past with GERD and nonalcoholic steatohepatitis.  She has a recent diagnosis of sleep apnea and is on CPAP also.  She she has history of dyslipidemia cervical radiculopathy and spinal stenosis.  The patient had prior follow-up with her primary care provider with blood work reported to be unremarkable except for a mildly elevated lipase.  She was prescribed Carafate and Bentyl.  The patient CBC has shown:  Component     Latest Ref Rng 04/12/2023 08/04/2024 08/05/2024  WBC     4.0 - 10.5 K/uL 7.2  21.5 (H)  16.4 (H)   RBC     3.87 - 5.11 MIL/uL 4.83  5.48 (H)  4.50   Hemoglobin     12.0 - 15.0 g/dL 86.1  84.3 (H)  87.0   HCT     36.0 - 46.0 % 41.6  48.2 (H)  39.4    The patient has also had a elevated alkaline phosphatase for many years including a elevation 10 years ago. The patient had a fecal calprotectin that was elevated on December 10 with a CT scan recently showing:  IMPRESSION: 1. Diffuse moderate ascites. This is stable from the prior CT examination but increased when compared with the prior ultrasound from 2021. 2.  Distal small bowel wall thickening, similar to prior exam, possibly reactive to ascites; underlying inflammatory bowel disease could be considered. No fistulization. The overall appearance is stable from the prior CT examination.  Also on the 10th of this month the C. difficile was negative as well as an H. pylori stool antigen.  The patient's stool was also sent for Salmonella Campylobacter and E. coli which were all negative.  The pancreatic elastase was normal.  The patient reports that she has not had a diarrhea bowel movement or any bowel movement since yesterday.  She states that it is because she is not eating.  She also reports that she is tolerating a liquid diet but after a few sips she feels like her abdomen is very distended and bloated just from a few sips of liquid.  She denies any unexplained weight loss black stools or bloody stools.  Past Medical History:  Diagnosis Date   Allergic rhinitis    Angio-edema    Cervical radiculopathy    Chronic sinusitis    COVID-19    Elevated liver enzymes    Fatty liver disease, nonalcoholic    GERD (gastroesophageal reflux disease)    Peroneal tendonitis, left    Plantar fasciitis  Situational depression    Trigger finger of left  hand    Type 2 diabetes mellitus (HCC)    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     Past Surgical History:  Procedure Laterality Date   ANTERIOR CERVICAL DECOMP/DISCECTOMY FUSION N/A 04/25/2023   Procedure: C6-7 ANTERIOR CERVICAL DISCECTOMY AND FUSION (GLOBUS FORGE);  Surgeon: Claudene Penne ORN, MD;  Location: [facility] ORS;  Service: Neurosurgery;  Laterality: N/A;   BREAST EXCISIONAL BIOPSY Left 2004-2005   benign   BREAST SURGERY     CESAREAN SECTION  2006   CHOLECYSTECTOMY     COLONOSCOPY N/A 12/28/2023   Procedure: COLONOSCOPY;  Surgeon: Onita Elspeth Sharper, DO;  Location: [facility] ENDOSCOPY;  Service: Gastroenterology;  Laterality: N/A;  DM   ETHMOIDECTOMY Left 05/24/2022   Procedure: ETHMOIDECTOMY- TOTAL WITH  FRONTAL SINUS EXPLORATION;  Surgeon: Blair Mt, MD;  Location: [facility];  Service: ENT;  Laterality: Left;  Diabetic   GALLBLADDER SURGERY     IMAGE GUIDED SINUS SURGERY Left 05/24/2022   Procedure: IMAGE GUIDED SINUS SURGERY;  Surgeon: Blair Mt, MD;  Location: [facility];  Service: ENT;  Laterality: Left;  PLACED DISK ON OR CHARGE NURSE DESK 8-23 KP   MAXILLARY ANTROSTOMY Left 05/24/2022   Procedure: MAXILLARY ANTROSTOMY WITH TISSUE REMOVAL;  Surgeon: Blair Mt, MD;  Location: [facility];  Service: ENT;  Laterality: Left;   POLYPECTOMY  12/28/2023   Procedure: POLYPECTOMY, INTESTINE;  Surgeon: Onita Elspeth Sharper, DO;  Location: [facility] ENDOSCOPY;  Service: Gastroenterology;;    Prior to Admission medications  Medication Sig Start Date End Date Taking? Authorizing Provider  dicyclomine (BENTYL) 10 MG capsule Take 10 mg by mouth 4 (four) times daily as needed. 08/02/24  Yes [provider]  DULoxetine  (CYMBALTA ) 60 MG capsule Take 60 mg by mouth daily.   Yes [provider]  EPINEPHrine  0.3 mg/0.3 mL IJ SOAJ injection Inject 0.3 mg into the muscle as needed. 10/20/20  Yes [provider]  fluticasone (FLONASE) 50 MCG/ACT nasal spray Place into both nostrils daily.   Yes [provider]  ipratropium (ATROVENT) 0.03 % nasal spray Place 2 sprays into both nostrils every 12 (twelve) hours.   Yes [provider]  levocetirizine (XYZAL) 5 MG tablet Take 5 mg by mouth every evening.   Yes [provider]  omeprazole (PRILOSEC) 20 MG capsule Take 20 mg by mouth daily.   Yes [provider]  OZEMPIC, 1 MG/DOSE, 4 MG/3ML SOPN Inject 1 mg into the skin once a week.   Yes [provider]  senna (SENOKOT) 8.6 MG TABS tablet Take 1 tablet (8.6 mg total) by mouth daily as needed for mild constipation. 04/26/23  Yes Gregory Edsel Ruth, PA  sucralfate (CARAFATE) 1 g tablet Take 1 g by mouth 4 (four) times  daily -  with meals and at bedtime. 08/02/24 08/02/25 Yes [provider]  tiZANidine  (ZANAFLEX ) 4 MG tablet TAKE 1 TABLET BY MOUTH 3 TIMES DAILY. 05/22/23  Yes Gregory Edsel Ruth, PA  triamcinolone  cream (KENALOG ) 0.1 % Apply 1 Application topically 2 (two) times daily as needed. 11/10/22  Yes [provider]  atorvastatin  (LIPITOR) 20 MG tablet Take 20 mg by mouth daily. Patient not taking: Reported on 08/04/2024    [provider]  gabapentin  (NEURONTIN ) 100 MG capsule Take 100 mg by mouth at bedtime. 400 mg at bedtime Patient not taking: Reported on 08/04/2024    [provider]    Family History  Problem  Relation Age of Onset   Breast cancer Neg Hx      Social History[1]  Allergies as of 08/03/2024 - Review Complete 02/27/2024  Allergen Reaction Noted   Cortisone Other (See Comments) and Swelling 12/06/2013    Review of Systems:    All systems reviewed and negative except where noted in HPI.   Physical Exam:  Vital signs in last 24 hours: Temp:  [98.2 F (36.8 C)-98.6 F (37 C)] 98.4 F (36.9 C) (12/15 0739) Pulse Rate:  [91-102] 102 (12/15 0739) Resp:  [15-18] 16 (12/15 0739) BP: (115-160)/(64-99) 115/72 (12/15 0739) SpO2:  [95 %-97 %] 96 % (12/15 0739) FiO2 (%):  [21 %] 21 % (12/14 2303) Last BM Date : 08/04/24 General:   Pleasant, cooperative in NAD Head:  Normocephalic and atraumatic. Eyes:   No icterus.   Conjunctiva pink. PERRLA. Ears:  Normal auditory acuity. Neck:  Supple; no masses or thyroidomegaly Lungs: Respirations even and unlabored. Lungs clear to auscultation bilaterally.   No wheezes, crackles, or rhonchi.  Heart:  Regular rate and rhythm;  Without murmur, clicks, rubs or gallops Abdomen:  Soft, positive distention with tympany to percussion, nontender. Normal bowel sounds. No appreciable masses or hepatomegaly.  No rebound or guarding.  Rectal:  Not performed. Msk:  Symmetrical without gross deformities.    Extremities:  Without edema, cyanosis or clubbing. Neurologic:  Alert and oriented x3;  grossly normal neurologically. Skin:  Intact without significant lesions or rashes. Cervical Nodes:  No significant cervical adenopathy. Psych:  Alert and cooperative. Normal affect.  LAB RESULTS: Recent Labs    08/04/24 0007 08/05/24 0511  WBC 21.5* 16.4*  HGB 15.6* 12.9  HCT 48.2* 39.4  PLT 230 171   BMET Recent Labs    08/04/24 0007 08/05/24 0511  NA 138 140  K 4.0 3.7  CL 99 107  CO2 27 24  GLUCOSE 160* 111*  BUN 16 9  CREATININE 0.65 0.47  CALCIUM  8.7* 8.3*   LFT Recent Labs    08/05/24 0511  PROT 5.2*  ALBUMIN 3.3*  AST 22  ALT 40  ALKPHOS 143*  BILITOT 0.4   PT/INR No results for input(s): LABPROT, INR in the last 72 hours.  STUDIES: US  ASCITES (ABDOMEN LIMITED) Result Date: 08/04/2024 CLINICAL DATA:  Ascites. EXAM: LIMITED ABDOMEN ULTRASOUND FOR ASCITES TECHNIQUE: Limited ultrasound survey for ascites was performed in all four abdominal quadrants. COMPARISON:  CT scan from earlier same day FINDINGS: Moderate volume ascites in all 4 quadrants. IMPRESSION: Positive for ascites. Electronically Signed   By: Camellia Candle M.D.   On: 08/04/2024 08:35   CT ABDOMEN PELVIS W CONTRAST Result Date: 08/04/2024 EXAM: CT ABDOMEN AND PELVIS WITH CONTRAST 08/04/2024 02:14:44 AM TECHNIQUE: CT of the abdomen and pelvis was performed with the administration of 100 mL of iohexol  (OMNIPAQUE ) 300 MG/ML solution. Multiplanar reformatted images are provided for review. Automated exposure control, iterative reconstruction, and/or weight-based adjustment of the mA/kV was utilized to reduce the radiation dose to as low as reasonably achievable. COMPARISON: 10/24/2013. CLINICAL HISTORY: upper abd pain, diarrhea, HR 120, WBC 21 FINDINGS: LOWER CHEST: Lung bases are free from acute infiltrate or sizable effusion. LIVER: The liver is within normal limits. GALLBLADDER AND BILE DUCTS: The  gallbladder has been surgically removed. No biliary ductal dilatation. SPLEEN: The spleen is within normal limits. PANCREAS: The pancreas is within normal limits. ADRENAL GLANDS: The adrenal glands are unremarkable. KIDNEYS, URETERS AND BLADDER: The kidneys are unremarkable. No stones in the kidneys or  ureters. No hydronephrosis. The bladder is decompressed. GI AND BOWEL: Thickening of the distal esophagus is noted, likely related to reflux. This is stable from the prior exam. The stomach is unremarkable. Distal small bowel shows some wall thickening .this is similar to that seen on the prior exam from 2015 and may simply be reactive to the underlying ascites. Possibility of underlying inflammatory bowel disease deserves consideration. No fistulization is seen. No obstructive or inflammatory changes of the colon are seen. The appendix is within normal limits. PERITONEUM AND RETROPERITONEUM: Diffuse moderate ascites is noted throughout the abdomen. No free air. VASCULATURE: Aorta is normal in caliber. LYMPH NODES: No lymphadenopathy. REPRODUCTIVE ORGANS: The uterus is within normal limits. No definitive ovarian lesion is seen. BONES AND SOFT TISSUES: No acute osseous abnormality. No focal soft tissue abnormality. IMPRESSION: 1. Diffuse moderate ascites. This is stable from the prior CT examination but increased when compared with the prior ultrasound from 2021. 2. Distal small bowel wall thickening, similar to prior exam, possibly reactive to ascites; underlying inflammatory bowel disease could be considered. No fistulization. The overall appearance is stable from the prior CT examination. Electronically signed by: Oneil Devonshire MD 08/04/2024 02:26 AM EST RP Workstation: [id number]      Impression / Plan:   Assessment: Principal Problem:   Sepsis due to undetermined organism (HCC) Active Problems:   SIRS (systemic inflammatory response syndrome) (HCC)   KARALYNE NUSSER is a 58 y.o. y/o female with who comes  in with a report of abdominal pain belching gas and diarrhea.  The patient only has the diarrhea when she eats which is more consistent with a nonsecretory diarrhea such as somebody with inflammatory bowel disease or an infection.  The patient reports that the symptoms have only been recent and a change from her previous bowel movements and issues.  The differential diagnosis includes irritable bowel syndrome versus possible IBD.  Plan:  The patient will be set up for an EGD and colonoscopy for tomorrow.  The patient will be given a prep for the colonoscopy.  She has been told that she should finish the prep so that we can get a good look around.  The patient has been explained the plan and agrees with it.  Thank you for involving me in the care of this patient.      LOS: 1 day   Tonya Copping, MD, MD. NOLIA 08/05/2024, 11:42 AM,  Pager [phone] 7am-5pm  Check AMION for 5pm -7am coverage and on weekends   Note: This dictation was prepared with Dragon dictation along with smaller phrase technology. Any transcriptional errors that result from this process are unintentional.       [1]  Social History Tobacco Use   Smoking status: Never   Smokeless tobacco: Never  Vaping Use   Vaping status: Never Used  Substance Use Topics   Alcohol use: No   Drug use: No

## 2024-08-05 NOTE — TOC CM/SW Note (Signed)
 Transition of Care [facility]) - Inpatient Brief Assessment   Patient Details  Name: Tonya Mueller MRN: [id number] Date of Birth: Dec 30, 1965  Transition of Care [facility]) CM/SW Contact:    Daved JONETTA Hamilton, RN Phone Number: 08/05/2024, 11:14 AM   Clinical Narrative:   Transition of Care (TOC) Screening Note   Patient Details  Name: Tonya Mueller Date of Birth: Jul 20, 1966   Transition of Care [facility]) CM/SW Contact:    Daved JONETTA Hamilton, RN Phone Number: 08/05/2024, 11:14 AM   Social resources added to AVS  Transition of Care Department [facility]) has reviewed patient and no TOC needs have been identified at this time. If new patient transition needs arise, please place a TOC consult.    Transition of Care Asessment: Insurance and Status: Insurance coverage has been reviewed Patient has primary care physician: Yes   Prior level of function:: Independent Prior/Current Home Services: No current home services Social Drivers of Health Review: SDOH reviewed interventions complete Readmission risk has been reviewed: Yes Transition of care needs: no transition of care needs at this time

## 2024-08-05 NOTE — Plan of Care (Signed)

## 2024-08-06 ENCOUNTER — Encounter: Admission: EM | Disposition: A | Payer: Self-pay | Source: Home / Self Care | Attending: Internal Medicine

## 2024-08-06 ENCOUNTER — Inpatient Hospital Stay: Admitting: Anesthesiology

## 2024-08-06 ENCOUNTER — Encounter: Payer: Self-pay | Admitting: Internal Medicine

## 2024-08-06 DIAGNOSIS — R197 Diarrhea, unspecified: Secondary | ICD-10-CM

## 2024-08-06 DIAGNOSIS — K30 Functional dyspepsia: Secondary | ICD-10-CM

## 2024-08-06 HISTORY — PX: ESOPHAGOGASTRODUODENOSCOPY: SHX5428

## 2024-08-06 HISTORY — PX: COLONOSCOPY: SHX5424

## 2024-08-06 LAB — COMPREHENSIVE METABOLIC PANEL WITH GFR
ALT: 35 U/L (ref 0–44)
AST: 17 U/L (ref 15–41)
Albumin: 3.7 g/dL (ref 3.5–5.0)
Alkaline Phosphatase: 151 U/L — ABNORMAL HIGH (ref 38–126)
Anion gap: 12 (ref 5–15)
BUN: 8 mg/dL (ref 6–20)
CO2: 23 mmol/L (ref 22–32)
Calcium: 8.8 mg/dL — ABNORMAL LOW (ref 8.9–10.3)
Chloride: 106 mmol/L (ref 98–111)
Creatinine, Ser: 0.51 mg/dL (ref 0.44–1.00)
GFR, Estimated: >60 mL/min (ref >60)
Glucose, Bld: 110 mg/dL — ABNORMAL HIGH (ref 70–99)
Potassium: 3.8 mmol/L (ref 3.5–5.1)
Sodium: 141 mmol/L (ref 135–145)
Total Bilirubin: 0.4 mg/dL (ref 0.0–1.2)
Total Protein: 5.9 g/dL — ABNORMAL LOW (ref 6.5–8.1)

## 2024-08-06 LAB — CBC
HCT: 42.2 % (ref 36.0–46.0)
Hemoglobin: 13.5 g/dL (ref 12.0–15.0)
MCH: 28.1 pg (ref 26.0–34.0)
MCHC: 32 g/dL (ref 30.0–36.0)
MCV: 87.9 fL (ref 80.0–100.0)
Platelets: 214 K/uL (ref 150–400)
RBC: 4.8 MIL/uL (ref 3.87–5.11)
RDW: 13.2 % (ref 11.5–15.5)
WBC: 18.4 K/uL — ABNORMAL HIGH (ref 4.0–10.5)
nRBC: 0 % (ref 0.0–0.2)

## 2024-08-06 LAB — GLUCOSE, CAPILLARY
Glucose-Capillary: 102 mg/dL — ABNORMAL HIGH (ref 70–99)
Glucose-Capillary: 105 mg/dL — ABNORMAL HIGH (ref 70–99)
Glucose-Capillary: 108 mg/dL — ABNORMAL HIGH (ref 70–99)
Glucose-Capillary: 110 mg/dL — ABNORMAL HIGH (ref 70–99)
Glucose-Capillary: 119 mg/dL — ABNORMAL HIGH (ref 70–99)
Glucose-Capillary: 135 mg/dL — ABNORMAL HIGH (ref 70–99)

## 2024-08-06 MED ORDER — PROPOFOL 10 MG/ML IV BOLUS
INTRAVENOUS | Status: DC | PRN
  Administered 2024-08-06 (×3): 50 mg via INTRAVENOUS

## 2024-08-06 MED ORDER — LIDOCAINE HCL (CARDIAC) PF 100 MG/5ML IV SOSY
PREFILLED_SYRINGE | INTRAVENOUS | Status: DC | PRN
  Administered 2024-08-06: 14:00:00 80 mg via INTRAVENOUS

## 2024-08-06 MED ORDER — SODIUM CHLORIDE 0.9 % IV SOLN: 12.5000 mg | Freq: Four times a day (QID) | INTRAVENOUS | Status: DC | PRN

## 2024-08-06 MED ORDER — DEXMEDETOMIDINE HCL IN NACL 80 MCG/20ML IV SOLN
INTRAVENOUS | Status: DC | PRN
  Administered 2024-08-06: 14:00:00 12 ug via INTRAVENOUS
  Administered 2024-08-06: 14:00:00 8 ug via INTRAVENOUS

## 2024-08-06 MED ORDER — PROPOFOL 500 MG/50ML IV EMUL
INTRAVENOUS | Status: DC | PRN
  Administered 2024-08-06: 14:00:00 75 ug/kg/min via INTRAVENOUS

## 2024-08-06 MED ORDER — GLYCOPYRROLATE 0.2 MG/ML IJ SOLN
INTRAMUSCULAR | Status: DC | PRN
  Administered 2024-08-06: 14:00:00 .2 mg via INTRAVENOUS

## 2024-08-06 NOTE — Plan of Care (Signed)
   Problem: Education: Goal: Ability to describe self-care measures that may prevent or decrease complications (Diabetes Survival Skills Education) will improve Outcome: Progressing

## 2024-08-06 NOTE — Op Note (Signed)
 [facility] Gastroenterology Patient Name: Tonya Mueller Procedure Date: 08/06/2024 1:38 PM MRN: [id number] Account #: [id number] Date of Birth: 13-Feb-1966 Admit Type: Inpatient Age: 58 Room: [facility] ENDO ROOM 4 Gender: Female Note Status: Finalized Instrument Name: Endoscope [id number] Procedure:             Upper GI endoscopy Indications:           Functional Dyspepsia, Abdominal bloating, Diarrhea Providers:             Rogelia Copping MD, MD Referring MD:          Silvano Leaven, MD (Referring MD) Medicines:             Propofol  per Anesthesia Complications:         No immediate complications. Procedure:             Pre-Anesthesia Assessment:                        - Prior to the procedure, a History and Physical was                         performed, and patient medications and allergies were                         reviewed. The patient's tolerance of previous                         anesthesia was also reviewed. The risks and benefits                         of the procedure and the sedation options and risks                         were discussed with the patient. All questions were                         answered, and informed consent was obtained. Prior                         Anticoagulants: The patient has taken no anticoagulant                         or antiplatelet agents. ASA Grade Assessment: II - A                         patient with mild systemic disease. After reviewing                         the risks and benefits, the patient was deemed in                         satisfactory condition to undergo the procedure.                        After obtaining informed consent, the endoscope was                         passed under direct vision. Throughout the procedure,  the patient's blood pressure, pulse, and oxygen                         saturations were monitored continuously. The Endoscope                         was introduced  through the mouth, and advanced to the                         second part of duodenum. The upper GI endoscopy was                         accomplished without difficulty. The patient tolerated                         the procedure well. Findings:      The examined esophagus was normal.      The entire examined stomach was normal.      The examined duodenum was normal. Biopsies were taken with a cold       forceps for histology.      Over 500 cc of fluid was removed from the stomach Impression:            - Normal esophagus.                        - Normal stomach.                        - Normal examined duodenum. Biopsied.                        - Over 500 cc of fluid was removed from the stomach Recommendation:        - Return patient to hospital ward for ongoing care.                        - Resume previous diet.                        - Continue present medications.                        - Await pathology results.                        [place] Chum up with Dr. Onita as an outpatient for                         possible gastroparesis Procedure Code(s):     --- Professional ---                        [phone], Esophagogastroduodenoscopy, flexible,                         transoral; with biopsy, single or multiple Diagnosis Code(s):     --- Professional ---                        R19.7, Diarrhea, unspecified  R14.0, Abdominal distension (gaseous)                        K30, Functional dyspepsia CPT copyright 2022 American Medical Association. All rights reserved. The codes documented in this report are preliminary and upon coder review may  be revised to meet current compliance requirements. Rogelia Copping MD, MD 08/06/2024 1:54:56 PM This report has been signed electronically. Number of Addenda: 0 Note Initiated On: 08/06/2024 1:38 PM Estimated Blood Loss:  Estimated blood loss: none.      [facility]

## 2024-08-06 NOTE — Transfer of Care (Signed)
 Immediate Anesthesia Transfer of Care Note  Patient: Tonya Mueller  Procedure(s) Performed: COLONOSCOPY EGD (ESOPHAGOGASTRODUODENOSCOPY)  Patient Location: PACU  Anesthesia Type:General  Level of Consciousness: sedated  Airway & Oxygen Therapy: Patient Spontanous Breathing  Post-op Assessment: Report given to RN and Post -op Vital signs reviewed and stable  Post vital signs: Reviewed and stable  Last Vitals:  Vitals Value Taken Time  BP 110/64 08/06/24 14:16  Temp 36.1 C 08/06/24 14:16  Pulse 112 08/06/24 14:19  Resp 22 08/06/24 14:19  SpO2 92 % 08/06/24 14:19  Vitals shown include unfiled device data.  Last Pain:  Vitals:   08/06/24 1416  TempSrc: Temporal  PainSc: 0-No pain      Patients Stated Pain Goal: 0 (08/05/24 0501)  Complications: No notable events documented.

## 2024-08-06 NOTE — Progress Notes (Signed)
 The patient has diarrhea bloating and abdominal discomfort.  The patient had an EGD and colonoscopy today with the EGD showing some retained fluid that may have been due to the prep although gastroparesis may be also something that may be causing her symptoms.  The patient had biopsies taken of the small bowel to rule out celiac's disease.  She also had a normal-appearing colonoscopy with biopsies taken randomly to rule out any microscopic colitis.    I am not planning to do any further investigation on her at this time due to needing to wait for the biopsies to come back.  She can follow-up with Dr. Onita her primary gastroenterologist after discharge.  I will sign off.  Please call if any further GI concerns or questions.  We would like to thank you for the opportunity to participate in the care of Tonya Mueller.

## 2024-08-06 NOTE — Progress Notes (Signed)
 1      PROGRESS NOTE    Tonya Mueller  FMW:[id number] DOB: 1965-12-18 DOA: 08/04/2024 PCP: [name], NP   Brief Narrative:   58 y.o. female with medical history significant of diabetes mellitus 2 on Ozempic, GERD, NASH, recent diagnosis of sleep apnea now on CPAP, obesity with BMI 43, dyslipidemia, cervical radiculopathy/cervical spinal stenosis with history of prior injection therapies to treat pain.  Patient presented to the ED complaining of 3 weeks of upper abdominal pain with diarrhea worse after eating   12/15: GI consult 12/16: EGD and colonoscopy, added Phenergan for refractory nausea and vomiting, try clear liquid diet   Assessment & Plan:   Principal Problem:   Sepsis due to undetermined organism [facility]) Active Problems:   SIRS (systemic inflammatory response syndrome) (HCC)   Diarrhea   Stomach upset   Abdominal pain, belching, gas and diarrhea NASH New onset ascites  SBP ruled out Status post EGD and colonoscopy on 12/16 by Dr. Jinny with EGD showing some retained fluid.  Biopsies taken of the small bowel to rule out any pathology.  GI has signed off - colonoscopy in May 2025 by Dr. Onita which did not show any acute pathology - Cannot rule out underlying GI issue due to GLP-1 - CT this admission shows normal-appearing liver - abdominal ultrasound has been obtained and confirms moderate volume ascites in all 4 quadrants-considering diffuse edema this cannot be tapped -Trial of clear liquid diet and advance as tolerated   Diabetes mellitus 2 on Ozempic Ozempic on hold as above Follow CBGs and provide SSI hemoglobin A1c 6.3 Hemoglobin A1c in July was 6.8   Dyslipidemia Hold  Lipitor given mild transaminitis    Mild sleep apnea on CPAP Continue CPAP on home settings   Class III obesity BMI 43 Counseled on admission   DVT prophylaxis: Lovenox       Code Status: Full code Family Communication: Daughter updated at bedside Disposition Plan: Possible  discharge in next 1 to 2 days depending on clinical condition and and if she can tolerate diet   Consultants:  GI  Procedure EGD and colonoscopy on 12/16   Subjective:  She could not finish all the prep.  Still nauseous and distended abdomen -hoping to get some answer with a EGD and colonoscopy today.  Daughter at bedside  Objective: Vitals:   08/06/24 1416 08/06/24 1426 08/06/24 1436 08/06/24 1514  BP: 110/64 101/72 110/73 135/79  Pulse: (!) 115 (!) 109 (!) 102 (!) 102  Resp: (!) 30 (!) 26 (!) 23 16  Temp: (!) 97 F (36.1 C)   98.3 F (36.8 C)  TempSrc: Temporal     SpO2: 92% 94% 95% 97%  Weight:      Height:        Intake/Output Summary (Last 24 hours) at 08/06/2024 1728 Last data filed at 08/06/2024 1412 Gross per 24 hour  Intake 500 ml  Output 0 ml  Net 500 ml   Filed Weights   08/04/24 0616  Weight: 103.9 kg    Examination:  General exam: Appears calm and comfortable  Respiratory system: Clear to auscultation. Respiratory effort normal. Cardiovascular system: S1 & S2 heard, RRR. No murmurs, pedal edema Gastrointestinal system: Abdomen is mildly distended, generalized tenderness Central nervous system: Alert and oriented. No focal neurological deficits. Extremities: Symmetric 5 x 5 power. Skin: No rashes, lesions or ulcers Psychiatry: Judgement and insight appear normal. Mood & affect appropriate.     Data Reviewed: I have personally  reviewed following labs and imaging studies  CBC: Recent Labs  Lab 08/04/24 0007 08/05/24 0511 08/06/24 0514  WBC 21.5* 16.4* 18.4*  HGB 15.6* 12.9 13.5  HCT 48.2* 39.4 42.2  MCV 88.0 87.6 87.9  PLT 230 171 214   Basic Metabolic Panel: Recent Labs  Lab 08/04/24 0007 08/05/24 0511 08/06/24 0514  NA 138 140 141  K 4.0 3.7 3.8  CL 99 107 106  CO2 27 24 23   GLUCOSE 160* 111* 110*  BUN 16 9 8   CREATININE 0.65 0.47 0.51  CALCIUM  8.7* 8.3* 8.8*   GFR: Estimated Creatinine Clearance: 87 mL/min (by C-G  formula based on SCr of 0.51 mg/dL). Liver Function Tests: Recent Labs  Lab 08/04/24 0007 08/05/24 0511 08/06/24 0514  AST 33 22 17  ALT 64* 40 35  ALKPHOS 188* 143* 151*  BILITOT 0.7 0.4 0.4  PROT 6.4* 5.2* 5.9*  ALBUMIN 3.9 3.3* 3.7   Recent Labs  Lab 08/04/24 0007  LIPASE 47    HbA1C: Recent Labs    08/04/24 0915  HGBA1C 6.3*   CBG: Recent Labs  Lab 08/06/24 0020 08/06/24 0433 08/06/24 0731 08/06/24 1201 08/06/24 1516  GLUCAP 135* 110* 119* 102* 105*    Anemia Panel: Recent Labs    08/04/24 0915  VITAMINB12 409  FOLATE 9.9  FERRITIN 130  TIBC 262  IRON 55  RETICCTPCT 1.6   Sepsis Labs: Recent Labs  Lab 08/04/24 0410  LATICACIDVEN 0.7    Recent Results (from the past 240 hours)  Blood culture (routine x 2)     Status: None (Preliminary result)   Collection Time: 08/04/24  3:56 AM   Specimen: BLOOD  Result Value Ref Range Status   Specimen Description BLOOD BLOOD LEFT HAND  Final   Special Requests   Final    BOTTLES DRAWN AEROBIC AND ANAEROBIC Blood Culture results may not be optimal due to an inadequate volume of blood received in culture bottles   Culture   Final    NO GROWTH 2 DAYS Performed at [facility], [address], [place], [place] [address]    Report Status PENDING  Incomplete  Blood culture (routine x 2)     Status: None (Preliminary result)   Collection Time: 08/04/24  4:06 AM   Specimen: BLOOD  Result Value Ref Range Status   Specimen Description BLOOD BLOOD RIGHT HAND  Final   Special Requests   Final    BOTTLES DRAWN AEROBIC AND ANAEROBIC Blood Culture results may not be optimal due to an inadequate volume of blood received in culture bottles   Culture   Final    NO GROWTH 2 DAYS Performed at [facility], [address] [place], [place] [address]    Report Status PENDING  Incomplete  C Difficile Quick Screen w PCR reflex     Status: None   Collection Time: 08/04/24  4:36 AM   Specimen: STOOL   Result Value Ref Range Status   C Diff antigen NEGATIVE NEGATIVE Final   C Diff toxin NEGATIVE NEGATIVE Final   C Diff interpretation No C. difficile detected.  Final    Comment: Performed at [facility], [address] [place], [place] [address]  Gastrointestinal Panel by PCR , Stool     Status: None   Collection Time: 08/04/24  4:36 AM   Specimen: STOOL  Result Value Ref Range Status   Campylobacter species NOT DETECTED NOT DETECTED Final   Plesimonas shigelloides NOT DETECTED NOT  DETECTED Final   Salmonella species NOT DETECTED NOT DETECTED Final   Yersinia enterocolitica NOT DETECTED NOT DETECTED Final   Vibrio species NOT DETECTED NOT DETECTED Final   Vibrio cholerae NOT DETECTED NOT DETECTED Final   Enteroaggregative E coli (EAEC) NOT DETECTED NOT DETECTED Final   Enteropathogenic E coli (EPEC) NOT DETECTED NOT DETECTED Final   Enterotoxigenic E coli (ETEC) NOT DETECTED NOT DETECTED Final   Shiga like toxin producing E coli (STEC) NOT DETECTED NOT DETECTED Final   Shigella/Enteroinvasive E coli (EIEC) NOT DETECTED NOT DETECTED Final   Cryptosporidium NOT DETECTED NOT DETECTED Final   Cyclospora cayetanensis NOT DETECTED NOT DETECTED Final   Entamoeba histolytica NOT DETECTED NOT DETECTED Final   Giardia lamblia NOT DETECTED NOT DETECTED Final   Adenovirus F40/41 NOT DETECTED NOT DETECTED Final   Astrovirus NOT DETECTED NOT DETECTED Final   Norovirus GI/GII NOT DETECTED NOT DETECTED Final   Rotavirus A NOT DETECTED NOT DETECTED Final   Sapovirus (I, II, IV, and V) NOT DETECTED NOT DETECTED Final    Comment: Performed at [facility], [address] [place], [place] [address]         Radiology Studies: No results found.       Scheduled Meds:  DULoxetine   60 mg Oral Daily   enoxaparin  (LOVENOX ) injection  0.5 mg/kg Subcutaneous Q24H   insulin  aspart  0-9 Units Subcutaneous Q4H   sodium chloride  flush  3 mL Intravenous Q12H   Continuous  Infusions:  sodium chloride  100 mL/hr at 08/06/24 1340   promethazine (PHENERGAN) injection (IM or IVPB)       LOS: 2 days    Time spent: 35 minutes    Cresencio Fairly, MD [facility] Pager [phone]  If 7PM-7AM, please contact night-coverage  08/06/2024, 5:28 PM

## 2024-08-06 NOTE — Anesthesia Postprocedure Evaluation (Signed)
 Anesthesia Post Note  Patient: Tonya Mueller  Procedure(s) Performed: COLONOSCOPY EGD (ESOPHAGOGASTRODUODENOSCOPY)  Patient location during evaluation: Endoscopy Anesthesia Type: General Level of consciousness: awake and alert Pain management: pain level controlled Vital Signs Assessment: post-procedure vital signs reviewed and stable Respiratory status: spontaneous breathing, nonlabored ventilation and respiratory function stable Cardiovascular status: blood pressure returned to baseline and stable Postop Assessment: no apparent nausea or vomiting Anesthetic complications: no   No notable events documented.   Last Vitals:  Vitals:   08/06/24 1436 08/06/24 1514  BP: 110/73 135/79  Pulse: (!) 102 (!) 102  Resp: (!) 23 16  Temp:  36.8 C  SpO2: 95% 97%    Last Pain:  Vitals:   08/06/24 1600  TempSrc:   PainSc: Asleep                 Fairy POUR Carmella Kees

## 2024-08-06 NOTE — Anesthesia Preprocedure Evaluation (Signed)
 Anesthesia Evaluation  Patient identified by MRN, date of birth, ID band Patient awake    Reviewed: Allergy & Precautions, NPO status , Patient's Chart, lab work & pertinent test results  History of Anesthesia Complications Negative for: history of anesthetic complications  Airway Mallampati: III  TM Distance: <3 FB Neck ROM: full    Dental  (+) Chipped, Poor Dentition, Missing   Pulmonary neg pulmonary ROS, neg shortness of breath   Pulmonary exam normal        Cardiovascular Exercise Tolerance: Good (-) angina negative cardio ROS Normal cardiovascular exam     Neuro/Psych  Neuromuscular disease  negative psych ROS   GI/Hepatic Neg liver ROS,GERD  Controlled,,  Endo/Other  diabetes, Type 2    Renal/GU negative Renal ROS  negative genitourinary   Musculoskeletal   Abdominal   Peds  Hematology negative hematology ROS (+)   Anesthesia Other Findings Past Medical History: No date: Allergic rhinitis No date: Angio-edema No date: Cervical radiculopathy No date: Chronic sinusitis No date: COVID-19 No date: Elevated liver enzymes No date: Fatty liver disease, nonalcoholic No date: GERD (gastroesophageal reflux disease) No date: Peroneal tendonitis, left No date: Plantar fasciitis No date: Situational depression No date: Trigger finger of left  hand No date: Type 2 diabetes mellitus (HCC) No date: 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  Past Surgical History: 04/25/2023: ANTERIOR CERVICAL DECOMP/DISCECTOMY FUSION; N/A     Comment:  Procedure: C6-7 ANTERIOR CERVICAL DISCECTOMY AND FUSION               (GLOBUS FORGE);  Surgeon: Claudene Penne ORN, MD;                Location: [facility] ORS;  Service: Neurosurgery;  Laterality:               N/A; 2004-2005: BREAST EXCISIONAL BIOPSY; Left     Comment:  benign No date: BREAST SURGERY 2006: CESAREAN SECTION No date: CHOLECYSTECTOMY 12/28/2023: COLONOSCOPY; N/A     Comment:   Procedure: COLONOSCOPY;  Surgeon: Onita Elspeth Sharper,              DO;  Location: [facility] ENDOSCOPY;  Service:               Gastroenterology;  Laterality: N/A;  DM 05/24/2022: ETHMOIDECTOMY; Left     Comment:  Procedure: ETHMOIDECTOMY- TOTAL WITH FRONTAL SINUS               EXPLORATION;  Surgeon: Blair Mt, MD;  Location:               [facility];  Service: ENT;  Laterality: Left;                Diabetic No date: GALLBLADDER SURGERY 05/24/2022: IMAGE GUIDED SINUS SURGERY; Left     Comment:  Procedure: IMAGE GUIDED SINUS SURGERY;  Surgeon:               Blair Mt, MD;  Location: [facility];                Service: ENT;  Laterality: Left;  PLACED DISK ON OR               CHARGE NURSE DESK 8-23 KP 05/24/2022: MAXILLARY ANTROSTOMY; Left     Comment:  Procedure: MAXILLARY ANTROSTOMY WITH TISSUE REMOVAL;                Surgeon: Blair Mt, MD;  Location: [facility];  Service: ENT;  Laterality: Left; 12/28/2023: POLYPECTOMY     Comment:  Procedure: POLYPECTOMY, INTESTINE;  Surgeon: Onita Elspeth Sharper, DO;  Location: [facility] ENDOSCOPY;  Service:               Gastroenterology;;  BMI    Body Mass Index: 43.27 kg/m      Reproductive/Obstetrics negative OB ROS                              Anesthesia Physical Anesthesia Plan  ASA: 3  Anesthesia Plan: General   Post-op Pain Management:    Induction: Intravenous  PONV Risk Score and Plan: Propofol  infusion and TIVA  Airway Management Planned: Natural Airway and Nasal Cannula  Additional Equipment:   Intra-op Plan:   Post-operative Plan:   Informed Consent: I have reviewed the patients History and Physical, chart, labs and discussed the procedure including the risks, benefits and alternatives for the proposed anesthesia with the patient or authorized representative who has indicated his/her understanding and acceptance.     Dental  Advisory Given  Plan Discussed with: Anesthesiologist, CRNA and Surgeon  Anesthesia Plan Comments: (Patient consented for risks of anesthesia including but not limited to:  - adverse reactions to medications - risk of airway placement if required - damage to eyes, teeth, lips or other oral mucosa - nerve damage due to positioning  - sore throat or hoarseness - Damage to heart, brain, nerves, lungs, other parts of body or loss of life  Patient voiced understanding and assent.)        Anesthesia Quick Evaluation

## 2024-08-06 NOTE — Plan of Care (Signed)
  Problem: Education: Goal: Ability to describe self-care measures that may prevent or decrease complications (Diabetes Survival Skills Education) will improve Outcome: Progressing   Problem: Health Behavior/Discharge Planning: Goal: Ability to identify and utilize available resources and services will improve Outcome: Progressing Goal: Ability to manage health-related needs will improve Outcome: Progressing   Problem: Metabolic: Goal: Ability to maintain appropriate glucose levels will improve Outcome: Progressing

## 2024-08-06 NOTE — Op Note (Signed)
 [facility] Gastroenterology Patient Name: Melis Trochez Procedure Date: 08/06/2024 1:37 PM MRN: [id number] Account #: [id number] Date of Birth: [birth year] Admit Type: Inpatient Age: 58 Room: [facility] ENDO ROOM 4 Gender: Female Note Status: Finalized Instrument Name: Colon Scope [id number] Procedure:             Colonoscopy Indications:           Chronic diarrhea Providers:             Rogelia Copping MD, MD Referring MD:          Silvano Leaven, MD (Referring MD) Medicines:             Propofol  per Anesthesia Complications:         No immediate complications. Procedure:             Pre-Anesthesia Assessment:                        - Prior to the procedure, a History and Physical was                         performed, and patient medications and allergies were                         reviewed. The patient's tolerance of previous                         anesthesia was also reviewed. The risks and benefits                         of the procedure and the sedation options and risks                         were discussed with the patient. All questions were                         answered, and informed consent was obtained. Prior                         Anticoagulants: The patient has taken no anticoagulant                         or antiplatelet agents. ASA Grade Assessment: II - A                         patient with mild systemic disease. After reviewing                         the risks and benefits, the patient was deemed in                         satisfactory condition to undergo the procedure.                        After obtaining informed consent, the colonoscope was                         passed under direct vision. Throughout the procedure,  the patient's blood pressure, pulse, and oxygen                         saturations were monitored continuously. The                         Colonoscope was introduced through the anus and                          advanced to the the cecum, identified by appendiceal                         orifice and ileocecal valve. The colonoscopy was                         performed without difficulty. The patient tolerated                         the procedure well. The quality of the bowel                         preparation was good. Findings:      The perianal and digital rectal examinations were normal.      The colon (entire examined portion) appeared normal.      Biopsies were taken with a cold forceps in the entire colon for       histology. Biopsies were taken with a cold forceps in the entire colon       for histology. Impression:            - The entire examined colon is normal.                        - Biopsies were taken with a cold forceps for                         histology in the entire colon.                        - Biopsies were taken with a cold forceps for                         histology in the entire colon. Recommendation:        - Return patient to hospital ward for ongoing care.                        - Resume previous diet.                        - Continue present medications.                        - Await pathology results. Procedure Code(s):     --- Professional ---                        [phone], Colonoscopy, flexible; with biopsy, single or                         multiple Diagnosis Code(s):     --- Professional ---  K52.9, Noninfective gastroenteritis and colitis,                         unspecified CPT copyright 2022 American Medical Association. All rights reserved. The codes documented in this report are preliminary and upon coder review may  be revised to meet current compliance requirements. Rogelia Copping MD, MD 08/06/2024 2:12:05 PM This report has been signed electronically. Number of Addenda: 0 Note Initiated On: 08/06/2024 1:37 PM Scope Withdrawal Time: 0 hours 9 minutes 37 seconds  Total Procedure Duration: 0 hours 14 minutes 9  seconds  Estimated Blood Loss:  Estimated blood loss: none.      [facility]

## 2024-08-07 ENCOUNTER — Inpatient Hospital Stay

## 2024-08-07 DIAGNOSIS — G4730 Sleep apnea, unspecified: Secondary | ICD-10-CM

## 2024-08-07 DIAGNOSIS — E66813 Obesity, class 3: Secondary | ICD-10-CM

## 2024-08-07 DIAGNOSIS — R197 Diarrhea, unspecified: Secondary | ICD-10-CM | POA: Diagnosis not present

## 2024-08-07 DIAGNOSIS — E785 Hyperlipidemia, unspecified: Secondary | ICD-10-CM | POA: Diagnosis not present

## 2024-08-07 DIAGNOSIS — G4739 Other sleep apnea: Secondary | ICD-10-CM | POA: Diagnosis not present

## 2024-08-07 DIAGNOSIS — K30 Functional dyspepsia: Secondary | ICD-10-CM | POA: Diagnosis not present

## 2024-08-07 DIAGNOSIS — E119 Type 2 diabetes mellitus without complications: Secondary | ICD-10-CM

## 2024-08-07 DIAGNOSIS — R188 Other ascites: Secondary | ICD-10-CM

## 2024-08-07 DIAGNOSIS — D721 Eosinophilia, unspecified: Secondary | ICD-10-CM | POA: Diagnosis not present

## 2024-08-07 LAB — GLUCOSE, CAPILLARY
Glucose-Capillary: 104 mg/dL — ABNORMAL HIGH (ref 70–99)
Glucose-Capillary: 71 mg/dL (ref 70–99)
Glucose-Capillary: 83 mg/dL (ref 70–99)
Glucose-Capillary: 84 mg/dL (ref 70–99)
Glucose-Capillary: 84 mg/dL (ref 70–99)
Glucose-Capillary: 86 mg/dL (ref 70–99)

## 2024-08-07 LAB — COMPREHENSIVE METABOLIC PANEL WITH GFR
ALT: 32 U/L (ref 0–44)
AST: 28 U/L (ref 15–41)
Albumin: 3.5 g/dL (ref 3.5–5.0)
Alkaline Phosphatase: 138 U/L — ABNORMAL HIGH (ref 38–126)
Anion gap: 11 (ref 5–15)
BUN: 8 mg/dL (ref 6–20)
CO2: 24 mmol/L (ref 22–32)
Calcium: 8.7 mg/dL — ABNORMAL LOW (ref 8.9–10.3)
Chloride: 108 mmol/L (ref 98–111)
Creatinine, Ser: 0.51 mg/dL (ref 0.44–1.00)
GFR, Estimated: >60 mL/min (ref >60)
Glucose, Bld: 84 mg/dL (ref 70–99)
Potassium: 3.8 mmol/L (ref 3.5–5.1)
Sodium: 142 mmol/L (ref 135–145)
Total Bilirubin: 0.6 mg/dL (ref 0.0–1.2)
Total Protein: 5.7 g/dL — ABNORMAL LOW (ref 6.5–8.1)

## 2024-08-07 LAB — BODY FLUID CELL COUNT WITH DIFFERENTIAL
Eos, Fluid: 84 %
Lymphs, Fluid: 5 %
Monocyte-Macrophage-Serous Fluid: 9 %
Neutrophil Count, Fluid: 2 %
Total Nucleated Cell Count, Fluid: 8591 uL

## 2024-08-07 LAB — CBC
HCT: 39.9 % (ref 36.0–46.0)
Hemoglobin: 13 g/dL (ref 12.0–15.0)
MCH: 28.6 pg (ref 26.0–34.0)
MCHC: 32.6 g/dL (ref 30.0–36.0)
MCV: 87.7 fL (ref 80.0–100.0)
Platelets: 171 K/uL (ref 150–400)
RBC: 4.55 MIL/uL (ref 3.87–5.11)
RDW: 13.6 % (ref 11.5–15.5)
WBC: 17.8 K/uL — ABNORMAL HIGH (ref 4.0–10.5)
nRBC: 0 % (ref 0.0–0.2)

## 2024-08-07 LAB — SURGICAL PATHOLOGY

## 2024-08-07 MED ORDER — LOPERAMIDE HCL 2 MG PO CAPS
2.0000 mg | ORAL_CAPSULE | Freq: Three times a day (TID) | ORAL | Status: DC | PRN
  Administered 2024-08-07: 12:00:00 2 mg via ORAL
  Filled 2024-08-07: qty 1

## 2024-08-07 MED ORDER — SODIUM CHLORIDE 0.9 % IV SOLN
2.0000 g | INTRAVENOUS | Status: DC
  Administered 2024-08-07 – 2024-08-13 (×7): 2 g via INTRAVENOUS
  Filled 2024-08-07 (×7): qty 20

## 2024-08-07 MED ORDER — FUROSEMIDE 40 MG PO TABS
40.0000 mg | ORAL_TABLET | Freq: Every day | ORAL | Status: DC
  Administered 2024-08-08 – 2024-08-15 (×8): 40 mg via ORAL
  Filled 2024-08-07 (×8): qty 1

## 2024-08-07 MED ORDER — LIDOCAINE HCL (PF) 1 % IJ SOLN
10.0000 mL | Freq: Once | INTRAMUSCULAR | Status: AC
  Administered 2024-08-07: 11:00:00 10 mL via INTRADERMAL

## 2024-08-07 MED ORDER — ROSUVASTATIN CALCIUM 10 MG PO TABS
10.0000 mg | ORAL_TABLET | Freq: Every day | ORAL | Status: DC
  Administered 2024-08-08 – 2024-08-15 (×8): 10 mg via ORAL
  Filled 2024-08-07 (×8): qty 1

## 2024-08-07 MED ORDER — SPIRONOLACTONE 25 MG PO TABS
25.0000 mg | ORAL_TABLET | Freq: Every day | ORAL | Status: DC
  Administered 2024-08-07 – 2024-08-09 (×3): 25 mg via ORAL
  Filled 2024-08-07 (×3): qty 1

## 2024-08-07 NOTE — Progress Notes (Signed)
°  Progress Note   Patient: Tonya Mueller FMW:[id number] DOB: January 26, 1966 DOA: 08/04/2024     3 DOS: the patient was seen and examined on 08/07/2024   Brief hospital course: 58 y.o. female with medical history significant of diabetes mellitus 2 on Ozempic, GERD, NASH, recent diagnosis of sleep apnea now on CPAP, obesity with BMI 43, dyslipidemia, cervical radiculopathy/cervical spinal stenosis with history of prior injection therapies to treat pain.  Patient presented to the ED complaining of 3 weeks of upper abdominal pain with diarrhea worse after eating    12/15: GI consult 12/16: EGD and colonoscopy appearance is negative but biopsies were taken, added Phenergan for refractory nausea and vomiting, try clear liquid diet 12/17.  Advance to solid food.  Will get a paracentesis.  Discontinue IV fluids.  Assessment and Plan: * Abdominal ascites Could be secondary to NASH.  Patient does have high white cell count, will CFI and abdominal fluid and may empirically start Rocephin .  Will likely start Aldactone  and low-dose Lasix .  Diarrhea Stool studies negative.  Colonoscopy appeared normal.  Biopsies taken.  As needed Imodium .  Stomach upset EGD negative  Controlled type 2 diabetes mellitus without complication, without long-term current use of insulin  (HCC) Hold Ozempic  Hyperlipidemia, unspecified Holding Lipitor  Sleep apnea On CPAP  Obesity, Class III, BMI 40-49.9 (morbid obesity) (HCC) Continue to monitor with starting diuresis        Subjective: Patient still having diarrhea.  Still with upset stomach and cramping.  Abdominal bloating.  Has ascites on ultrasound.  Physical Exam: Vitals:   08/07/24 0817 08/07/24 1121 08/07/24 1147 08/07/24 1218  BP: 114/74 127/78 130/74 (!) 149/68  Pulse: (!) 107 96 96 (!) 102  Resp: 16   18  Temp: 97.8 F (36.6 C)   98.3 F (36.8 C)  TempSrc:      SpO2: 95% 95% 96% 99%  Weight:      Height:       Physical Exam HENT:     Head:  Normocephalic.  Eyes:     General: Lids are normal.     Conjunctiva/sclera: Conjunctivae normal.  Cardiovascular:     Rate and Rhythm: Normal rate and regular rhythm.     Heart sounds: Normal heart sounds, S1 normal and S2 normal.  Pulmonary:     Breath sounds: No decreased breath sounds, wheezing, rhonchi or rales.  Abdominal:     General: There is distension.     Palpations: Abdomen is soft.     Tenderness: There is generalized abdominal tenderness.  Musculoskeletal:     Right lower leg: No swelling.     Left lower leg: No swelling.  Skin:    General: Skin is warm.     Findings: No rash.  Neurological:     Mental Status: She is alert and oriented to person, place, and time.     Data Reviewed: Blood cultures negative from the 14th Creatinine 0.51, electrolytes normal range, white blood cell count 17.8, hemoglobin 13, platelet count 171 Family Communication: Diet bedside  Disposition: Status is: Inpatient Remains inpatient appropriate because: Will get ultrasound abdomen for paracentesis today.  Planned Discharge Destination: Home    Time spent: 28 minutes  Author: Charlie Patterson, MD 08/07/2024 12:52 PM  For on call review [email or web address].

## 2024-08-07 NOTE — Assessment & Plan Note (Signed)
 Stool studies negative.  Colonoscopy appeared normal.  Biopsies taken.  As needed Imodium .

## 2024-08-07 NOTE — Assessment & Plan Note (Signed)
 Restarting statin

## 2024-08-07 NOTE — Assessment & Plan Note (Signed)
 Continue to monitor with starting diuresis.  BMI 43.27

## 2024-08-07 NOTE — Plan of Care (Signed)
°  Problem: Nutritional: Goal: Maintenance of adequate nutrition will improve Outcome: Not Progressing Goal: Progress toward achieving an optimal weight will improve Outcome: Not Progressing   Problem: Pain Managment: Goal: General experience of comfort will improve and/or be controlled Outcome: Not Progressing

## 2024-08-07 NOTE — Plan of Care (Signed)

## 2024-08-07 NOTE — Assessment & Plan Note (Signed)
 EGD negative.  Biopsy consistent with chronic peptic duodenitis.  Will start Protonix .

## 2024-08-07 NOTE — Hospital Course (Addendum)
 58 y.o. female with medical history significant of diabetes mellitus 2 on Ozempic, GERD, NASH, recent diagnosis of sleep apnea now on CPAP, obesity with BMI 43, dyslipidemia, cervical radiculopathy/cervical spinal stenosis with history of prior injection therapies to treat pain.  Patient presented to the ED complaining of 3 weeks of upper abdominal pain with diarrhea worse after eating   EGD and colonoscopy appearance is negative, pathology came back with duodenitis.  Colon sample was negative. Paracentesis performed on 1217 showing eosinophils.  Started on empiric antibiotics. ANA negative, ANCA negative, Strongyloides, IgE negative.   Oncology ordered BCR-ABL, flow cytometry, myeloid NGS, pro BNP, T-cell panel and tryptase.  Albendazole  400 mg twice daily ordered on 12/21 for 7 days.  Per recommendation from ID. 12/22.   Abdominal paracentesis drew off another 1.8 L with eosinophilic predominance.  Since tryptase level came back normal oncology canceled the bone marrow biopsy. Patient condition has improved, diarrhea is slowing down.  Medically stable for discharge.

## 2024-08-07 NOTE — Progress Notes (Signed)
 Reviewed cell count from ascites.  Nucleated cell count 8591.  Will start empiric Rocephin .  I also noticed that high level of eosinophils.  Will add on cytology and acid-fast culture.  Will also check strongyloidiasis antibody in the blood for tomorrow.  Dr. Charlie Patterson

## 2024-08-07 NOTE — Assessment & Plan Note (Signed)
-  Hold Ozempic °

## 2024-08-07 NOTE — Assessment & Plan Note (Signed)
 On CPAP. ?

## 2024-08-07 NOTE — Assessment & Plan Note (Addendum)
 Patient interested in starting empiric medication for treating parasite.  Will start albendazole  400 mg twice daily.  Case discussed with infectious disease specialist.  High eosinophil count in abdominal fluid.  On empiric Rocephin  for suspected SBP blood culture negative.  Increased Aldactone  and low-dose Lasix .  Sedimentation rate normal.  CRP low.  ANA and ANCA are negative..  IgG Strongyloides antibody negative. Cytology negative. Oncology sent off testing which is still pending.  Will repeat another paracentesis on Monday.

## 2024-08-08 DIAGNOSIS — K30 Functional dyspepsia: Secondary | ICD-10-CM | POA: Diagnosis not present

## 2024-08-08 DIAGNOSIS — G4739 Other sleep apnea: Secondary | ICD-10-CM

## 2024-08-08 DIAGNOSIS — R197 Diarrhea, unspecified: Secondary | ICD-10-CM | POA: Diagnosis not present

## 2024-08-08 DIAGNOSIS — K652 Spontaneous bacterial peritonitis: Secondary | ICD-10-CM | POA: Insufficient documentation

## 2024-08-08 DIAGNOSIS — D721 Eosinophilia, unspecified: Secondary | ICD-10-CM | POA: Diagnosis not present

## 2024-08-08 DIAGNOSIS — R188 Other ascites: Secondary | ICD-10-CM | POA: Diagnosis not present

## 2024-08-08 LAB — CBC WITH DIFFERENTIAL/PLATELET
Abs Immature Granulocytes: 0.04 K/uL (ref 0.00–0.07)
Basophils Absolute: 0.1 K/uL (ref 0.0–0.1)
Basophils Relative: 0 %
Eosinophils Absolute: 9.4 K/uL — ABNORMAL HIGH (ref 0.0–0.5)
Eosinophils Relative: 57 %
HCT: 37.9 % (ref 36.0–46.0)
Hemoglobin: 12.6 g/dL (ref 12.0–15.0)
Immature Granulocytes: 0 %
Lymphocytes Relative: 15 %
Lymphs Abs: 2.6 K/uL (ref 0.7–4.0)
MCH: 28.9 pg (ref 26.0–34.0)
MCHC: 33.2 g/dL (ref 30.0–36.0)
MCV: 86.9 fL (ref 80.0–100.0)
Monocytes Absolute: 0.3 K/uL (ref 0.1–1.0)
Monocytes Relative: 2 %
Neutro Abs: 4.3 K/uL (ref 1.7–7.7)
Neutrophils Relative %: 26 %
Platelets: 171 K/uL (ref 150–400)
RBC: 4.36 MIL/uL (ref 3.87–5.11)
RDW: 13.3 % (ref 11.5–15.5)
Smear Review: NORMAL
WBC: 16.7 K/uL — ABNORMAL HIGH (ref 4.0–10.5)
nRBC: 0 % (ref 0.0–0.2)

## 2024-08-08 LAB — BASIC METABOLIC PANEL WITH GFR
Anion gap: 11 (ref 5–15)
BUN: 8 mg/dL (ref 6–20)
CO2: 24 mmol/L (ref 22–32)
Calcium: 8.5 mg/dL — ABNORMAL LOW (ref 8.9–10.3)
Chloride: 107 mmol/L (ref 98–111)
Creatinine, Ser: 0.38 mg/dL — ABNORMAL LOW (ref 0.44–1.00)
GFR, Estimated: >60 mL/min (ref >60)
Glucose, Bld: 95 mg/dL (ref 70–99)
Potassium: 3.4 mmol/L — ABNORMAL LOW (ref 3.5–5.1)
Sodium: 141 mmol/L (ref 135–145)

## 2024-08-08 LAB — SEDIMENTATION RATE: Sed Rate: 4 mm/h (ref 0–30)

## 2024-08-08 LAB — GLUCOSE, CAPILLARY
Glucose-Capillary: 101 mg/dL — ABNORMAL HIGH (ref 70–99)
Glucose-Capillary: 120 mg/dL — ABNORMAL HIGH (ref 70–99)
Glucose-Capillary: 123 mg/dL — ABNORMAL HIGH (ref 70–99)
Glucose-Capillary: 93 mg/dL (ref 70–99)
Glucose-Capillary: 96 mg/dL (ref 70–99)
Glucose-Capillary: 97 mg/dL (ref 70–99)

## 2024-08-08 LAB — C-REACTIVE PROTEIN: CRP: <3 mg/dL — ABNORMAL HIGH (ref <1.0)

## 2024-08-08 LAB — CYTOLOGY - NON PAP

## 2024-08-08 LAB — PATHOLOGIST SMEAR REVIEW

## 2024-08-08 MED ADMIN — Potassium Chloride Microencapsulated Crys ER Tab 20 mEq: 20 meq | ORAL | @ 14:00:00 | NDC 00245531989

## 2024-08-08 MED FILL — Potassium Chloride Microencapsulated Crys ER Tab 20 mEq: 20.0000 meq | ORAL | Qty: 1 | Status: AC

## 2024-08-08 NOTE — Plan of Care (Signed)
  Problem: Coping: Goal: Ability to adjust to condition or change in health will improve Outcome: Progressing   Problem: Fluid Volume: Goal: Ability to maintain a balanced intake and output will improve Outcome: Progressing   Problem: Health Behavior/Discharge Planning: Goal: Ability to identify and utilize available resources and services will improve Outcome: Progressing Goal: Ability to manage health-related needs will improve Outcome: Progressing

## 2024-08-08 NOTE — Assessment & Plan Note (Signed)
 Eosinophils seen in ascitic fluid and on CBC with differential.  Undergoing workup.  On empiric antibiotic.

## 2024-08-08 NOTE — Progress Notes (Signed)
 Progress Note   Patient: Tonya Mueller FMW:[id number] DOB: 1966/07/11 DOA: 08/04/2024     4 DOS: the patient was seen and examined on 08/08/2024   Brief hospital course: 58 y.o. female with medical history significant of diabetes mellitus 2 on Ozempic, GERD, NASH, recent diagnosis of sleep apnea now on CPAP, obesity with BMI 43, dyslipidemia, cervical radiculopathy/cervical spinal stenosis with history of prior injection therapies to treat pain.  Patient presented to the ED complaining of 3 weeks of upper abdominal pain with diarrhea worse after eating    12/15: GI consult 12/16: EGD and colonoscopy appearance is negative but biopsies were taken, added Phenergan for refractory nausea and vomiting, try clear liquid diet 12/17.  Advance to solid food.  Discontinue IV fluids.  Paracentesis showing eosinophils.  Started on empiric antibiotics. 12/18.  CBC with differential showing eosinophilia, sed rate of 4  Assessment and Plan: * Abdominal ascites Could be secondary to NASH.  Strange that there are a lot of eosinophils in the fluid.  Started on empiric Rocephin  for suspected SBP.  Continue Aldactone  and low-dose Lasix .  Sedimentation rate normal.  Follow-up ANCA and ANA.  Ordered Strongyloides antibody.  If cultures remain negative I may empirically start steroids.  Eosinophilia, unspecified Eosinophils seen in ascitic fluid and on CBC with differential.  Undergoing workup.  On empiric antibiotic.  Stomach upset EGD negative  Diarrhea Stool studies negative.  Colonoscopy appeared normal.  Biopsies taken.  As needed Imodium .  Send off ova and parasites.  Controlled type 2 diabetes mellitus without complication, without long-term current use of insulin  (HCC) Hold Ozempic  Hyperlipidemia, unspecified Restarting statin  Sleep apnea On CPAP  Obesity, Class III, BMI 40-49.9 (morbid obesity) (HCC) Continue to monitor with starting diuresis.  BMI 43.27        Subjective: Patient  feels a little bit better today but not back to her normal.  Still swollen in the abdomen.  Still having diarrhea.  Did not eat much yesterday.  Physical Exam: Vitals:   08/07/24 1530 08/07/24 2007 08/08/24 0413 08/08/24 0806  BP: 128/71 130/63 123/68 123/71  Pulse: (!) 104 96 90 97  Resp: 18 20 18 16   Temp: 98.4 F (36.9 C) 98.3 F (36.8 C) 98.3 F (36.8 C) 98 F (36.7 C)  TempSrc:    Oral  SpO2: 96% 99% 96% 96%  Weight:      Height:       Physical Exam HENT:     Head: Normocephalic.  Eyes:     General: Lids are normal.     Conjunctiva/sclera: Conjunctivae normal.  Cardiovascular:     Rate and Rhythm: Normal rate and regular rhythm.     Heart sounds: Normal heart sounds, S1 normal and S2 normal.  Pulmonary:     Breath sounds: No decreased breath sounds, wheezing, rhonchi or rales.  Abdominal:     General: There is distension.     Palpations: Abdomen is soft.     Tenderness: There is generalized abdominal tenderness.  Musculoskeletal:     Right lower leg: No swelling.     Left lower leg: No swelling.  Skin:    General: Skin is warm.     Findings: No rash.  Neurological:     Mental Status: She is alert and oriented to person, place, and time.     Data Reviewed: Eosinophil seen on ascites tap Eosinophils seen on CBC White blood cell count 16.7, hemoglobin 12.6, platelet count 171, creatinine 0.38, potassium 3.4  Family Communication: Daughter at bedside  Disposition: Status is: Inpatient Remains inpatient appropriate because: Empirically treat for SBP.  Workup for elevated eosinophils.  Patient not eating well  Planned Discharge Destination: Home    Time spent: 28 minutes  Author: Charlie Patterson, MD 08/08/2024 12:31 PM  For on call review [email or web address].

## 2024-08-08 NOTE — Plan of Care (Signed)

## 2024-08-09 ENCOUNTER — Inpatient Hospital Stay

## 2024-08-09 DIAGNOSIS — R197 Diarrhea, unspecified: Secondary | ICD-10-CM | POA: Diagnosis not present

## 2024-08-09 DIAGNOSIS — K30 Functional dyspepsia: Secondary | ICD-10-CM | POA: Diagnosis not present

## 2024-08-09 DIAGNOSIS — G4730 Sleep apnea, unspecified: Secondary | ICD-10-CM | POA: Diagnosis not present

## 2024-08-09 DIAGNOSIS — R188 Other ascites: Secondary | ICD-10-CM | POA: Diagnosis not present

## 2024-08-09 DIAGNOSIS — G4739 Other sleep apnea: Secondary | ICD-10-CM | POA: Diagnosis not present

## 2024-08-09 DIAGNOSIS — D7219 Other eosinophilia: Secondary | ICD-10-CM

## 2024-08-09 DIAGNOSIS — E876 Hypokalemia: Secondary | ICD-10-CM | POA: Insufficient documentation

## 2024-08-09 DIAGNOSIS — E66813 Obesity, class 3: Secondary | ICD-10-CM | POA: Diagnosis not present

## 2024-08-09 DIAGNOSIS — E785 Hyperlipidemia, unspecified: Secondary | ICD-10-CM | POA: Diagnosis not present

## 2024-08-09 DIAGNOSIS — D721 Eosinophilia, unspecified: Secondary | ICD-10-CM | POA: Diagnosis not present

## 2024-08-09 DIAGNOSIS — E119 Type 2 diabetes mellitus without complications: Secondary | ICD-10-CM | POA: Diagnosis not present

## 2024-08-09 LAB — URINALYSIS, W/ REFLEX TO CULTURE (INFECTION SUSPECTED)
Bacteria, UA: NONE SEEN
Bilirubin Urine: NEGATIVE
Glucose, UA: NEGATIVE mg/dL
Hgb urine dipstick: NEGATIVE
Ketones, ur: NEGATIVE mg/dL
Leukocytes,Ua: NEGATIVE
Nitrite: NEGATIVE
Protein, ur: NEGATIVE mg/dL
Specific Gravity, Urine: 1.01 (ref 1.005–1.030)
pH: 5 (ref 5.0–8.0)

## 2024-08-09 LAB — GLUCOSE, CAPILLARY
Glucose-Capillary: 108 mg/dL — ABNORMAL HIGH (ref 70–99)
Glucose-Capillary: 116 mg/dL — ABNORMAL HIGH (ref 70–99)
Glucose-Capillary: 122 mg/dL — ABNORMAL HIGH (ref 70–99)
Glucose-Capillary: 128 mg/dL — ABNORMAL HIGH (ref 70–99)
Glucose-Capillary: 131 mg/dL — ABNORMAL HIGH (ref 70–99)
Glucose-Capillary: 163 mg/dL — ABNORMAL HIGH (ref 70–99)

## 2024-08-09 LAB — BASIC METABOLIC PANEL WITH GFR
Anion gap: 8 (ref 5–15)
BUN: 7 mg/dL (ref 6–20)
CO2: 28 mmol/L (ref 22–32)
Calcium: 8 mg/dL — ABNORMAL LOW (ref 8.9–10.3)
Chloride: 104 mmol/L (ref 98–111)
Creatinine, Ser: 0.54 mg/dL (ref 0.44–1.00)
GFR, Estimated: >60 mL/min
Glucose, Bld: 114 mg/dL — ABNORMAL HIGH (ref 70–99)
Potassium: 3.2 mmol/L — ABNORMAL LOW (ref 3.5–5.1)
Sodium: 140 mmol/L (ref 135–145)

## 2024-08-09 LAB — CBC
HCT: 41.2 % (ref 36.0–46.0)
Hemoglobin: 13.1 g/dL (ref 12.0–15.0)
MCH: 28.2 pg (ref 26.0–34.0)
MCHC: 31.8 g/dL (ref 30.0–36.0)
MCV: 88.8 fL (ref 80.0–100.0)
Platelets: 189 K/uL (ref 150–400)
RBC: 4.64 MIL/uL (ref 3.87–5.11)
RDW: 13.5 % (ref 11.5–15.5)
WBC: 17.2 K/uL — ABNORMAL HIGH (ref 4.0–10.5)
nRBC: 0 % (ref 0.0–0.2)

## 2024-08-09 LAB — ANCA PROFILE
Anti-MPO Antibodies: <0.2 U (ref 0.0–0.9)
Anti-PR3 Antibodies: <0.2 U (ref 0.0–0.9)
Atypical P-ANCA titer: <1:20 {titer}
C-ANCA: <1:20 {titer}
P-ANCA: <1:20 {titer}

## 2024-08-09 LAB — CULTURE, BLOOD (ROUTINE X 2)
Culture: NO GROWTH
Culture: NO GROWTH

## 2024-08-09 LAB — ANA W/REFLEX IF POSITIVE: Anti Nuclear Antibody (ANA): NEGATIVE

## 2024-08-09 LAB — STRONGYLOIDES, AB, IGG: Strongyloides, Ab, IgG: NEGATIVE

## 2024-08-09 LAB — ACID FAST SMEAR (AFB, MYCOBACTERIA): Acid Fast Smear: NEGATIVE

## 2024-08-09 MED ORDER — SPIRONOLACTONE 25 MG PO TABS
50.0000 mg | ORAL_TABLET | Freq: Every day | ORAL | Status: DC
  Administered 2024-08-10 – 2024-08-12 (×3): 50 mg via ORAL
  Filled 2024-08-09 (×3): qty 2

## 2024-08-09 MED ORDER — PANTOPRAZOLE SODIUM 40 MG PO TBEC
40.0000 mg | DELAYED_RELEASE_TABLET | Freq: Every day | ORAL | Status: DC
  Administered 2024-08-09 – 2024-08-15 (×7): 40 mg via ORAL
  Filled 2024-08-09 (×7): qty 1

## 2024-08-09 MED ORDER — POTASSIUM CHLORIDE CRYS ER 20 MEQ PO TBCR
40.0000 meq | EXTENDED_RELEASE_TABLET | Freq: Once | ORAL | Status: AC
  Administered 2024-08-09: 40 meq via ORAL
  Filled 2024-08-09: qty 2

## 2024-08-09 NOTE — Progress Notes (Signed)
 " Progress Note   Patient: Tonya Mueller FMW:[id number] DOB: September 30, 1965 DOA: 08/04/2024     5 DOS: the patient was seen and examined on 08/09/2024   Brief hospital course: 58 y.o. female with medical history significant of diabetes mellitus 2 on Ozempic, GERD, NASH, recent diagnosis of sleep apnea now on CPAP, obesity with BMI 43, dyslipidemia, cervical radiculopathy/cervical spinal stenosis with history of prior injection therapies to treat pain.  Patient presented to the ED complaining of 3 weeks of upper abdominal pain with diarrhea worse after eating    12/15: GI consult 12/16: EGD and colonoscopy appearance is negative but biopsies were taken, added Phenergan for refractory nausea and vomiting, try clear liquid diet 12/17.  Advance to solid food.  Discontinue IV fluids.  Paracentesis showing eosinophils.  Started on empiric antibiotics. 12/18.  CBC with differential showing eosinophilia, sed rate of 4 12/19.  ANA, ANCA, Strongyloides, IgE still pending.  Consulted oncology.  Oncology ordered BCR-ABL, flow cytometry, myeloid NGS, pro BNP, T-cell panel and tryptase.  Assessment and Plan: * Abdominal ascites Could be secondary to NASH.  Strange that there are a lot of eosinophils in the fluid.  Started on empiric Rocephin  for suspected SBP.  So far cultures are negative.  Increase Aldactone  and low-dose Lasix .  Sedimentation rate normal.  CRP low.  Follow-up ANCA and ANA.  Follow-up Strongyloides antibody.  If cultures remain negative I may empirically start steroids.  Tuberculosis screening for ascites fluid pending.  Cytology negative.  Eosinophilia, unspecified Eosinophils seen in ascitic fluid and on CBC with differential.  Undergoing workup.  On empiric antibiotic.  Consulted oncology and she ordered a BCR-ABL, flow cytometry, myeloid NGS, proBNP, T-cell panel and tryptase.  Cytology of the ascites fluid negative.  Stomach upset EGD negative.  Biopsy consistent with chronic peptic  duodenitis.  Will start Protonix .  Diarrhea Stool studies negative.  Colonoscopy appeared normal.  Biopsies taken.  As needed Imodium .  Send off ova and parasites.  Controlled type 2 diabetes mellitus without complication, without long-term current use of insulin  (HCC) Hold Ozempic  Hyperlipidemia, unspecified Restarting statin  Sleep apnea On CPAP  Obesity, Class III, BMI 40-49.9 (morbid obesity) (HCC) Continue to monitor with starting diuresis.  BMI 43.27  Hypokalemia Increase Aldactone , replace potassium.        Subjective: Patient still not feeling very well.  Eating only very small amounts.  Still having some diarrhea.  Still with abdominal bloating.  Swelling of the legs.  Physical Exam: Vitals:   08/08/24 1538 08/08/24 2039 08/09/24 0441 08/09/24 0808  BP: 108/75 120/75 (!) 106/58 (!) 127/93  Pulse: (!) 107 (!) 105 87 (!) 109  Resp: 16 18 18 17   Temp: 99.3 F (37.4 C) 98.3 F (36.8 C) 98.3 F (36.8 C) 98.4 F (36.9 C)  TempSrc: Oral   Oral  SpO2: 97% 95% 96% 97%  Weight:      Height:       Physical Exam HENT:     Head: Normocephalic.  Eyes:     General: Lids are normal.     Conjunctiva/sclera: Conjunctivae normal.  Cardiovascular:     Rate and Rhythm: Normal rate and regular rhythm.     Heart sounds: Normal heart sounds, S1 normal and S2 normal.  Pulmonary:     Breath sounds: No decreased breath sounds, wheezing, rhonchi or rales.  Abdominal:     General: There is distension.     Palpations: Abdomen is soft.     Tenderness: There  is generalized abdominal tenderness.  Musculoskeletal:     Right lower leg: Swelling present.     Left lower leg: Swelling present.  Skin:    General: Skin is warm.     Findings: No rash.  Neurological:     Mental Status: She is alert and oriented to person, place, and time.     Data Reviewed: Potassium 3.2, creatinine 0.54, white blood cell count 17.2, hemoglobin 13.1, platelet count 189  Family Communication:  Daughter at bedside  Disposition: Status is: Inpatient Remains inpatient appropriate because: Consulting oncology for further evaluation of eosinophilia.  May end up needing steroids.  Watching cultures to make sure no infection prior to starting steroid.  Likely will need another paracentesis on Monday.  Planned Discharge Destination: Home    Time spent: 28 minutes  Author: Charlie Patterson, MD 08/09/2024 12:22 PM  For on call review [email or web address].  "

## 2024-08-09 NOTE — Plan of Care (Signed)

## 2024-08-09 NOTE — Consult Note (Signed)
 Infectious Disease     Reason for Consult: eosinophilic peritonitis   Referring Physician: Josette Ade, MD  Date of Admission:  08/04/2024   Principal Problem:   Abdominal ascites Active Problems:   Diarrhea   Stomach upset   Controlled type 2 diabetes mellitus without complication, without long-term current use of insulin  (HCC)   Hyperlipidemia, unspecified   Sleep apnea   Obesity, Class III, BMI 40-49.9 (morbid obesity) (HCC)   Eosinophilia, unspecified   SBP (spontaneous bacterial peritonitis) (HCC)   Hypokalemia   HPI: Tonya Mueller is a 58 y.o. female with medical history significant of diabetes mellitus 2 on Ozempic, GERD, NASH, recent diagnosis of sleep apnea now on CPAP, obesity with BMI 43, dyslipidemia, cervical radiculopathy/cervical spinal stenosis admitted December 14 with 3 weeks of upper abdominal pain with diarrhea failing outpatient treatment.  On admission she was afebrile white blood count was 21,000, but no DIF was initially done.  Alk phos slightly elevated at 188 and ALT 64.  But other LFTs normal.  Lactic acid was normal.  Had a IgM hep B surface antigen hep B core antibody and HCV were all negative.  HIV testing was negative.  Strongyloides antibody was negative.   Urine sample showed 6-10 white cells.  C. difficile testing was negative.  PCR are was negative. She had CT  scan done and compared to 1 done in March 2015 which showed diffuse moderate ascites stable from prior CT but increased from 2021.  There was distal small bowel wall thickening similar to prior exam possible reactive to ascites.  There is thickening of the distal esophagus Since admit she has had an EGD and colon done with normal appearance and biopsies taken.  She underwent paracentesis with 8591 total nucleated cells  84% eosinophils.  Cytology on the fluid was negative.  Culture has been sent to assess AFB.  She was started on ceftriaxone  and metronidazole  Repeat white count is down to 17.2  but eosinophil of 9.4 thousand with 57% eosinophils   On review she has had largley nml Eosinophilia counts but in oct 2023 had eosinophil count of 3000.  She had CT scan 2015 with moderate ascites  but unclear work up at that time   Past Medical History:  Diagnosis Date   Allergic rhinitis    Angio-edema    Cervical radiculopathy    Chronic sinusitis    COVID-19    Elevated liver enzymes    Fatty liver disease, nonalcoholic    GERD (gastroesophageal reflux disease)    Peroneal tendonitis, left    Plantar fasciitis    Situational depression    Trigger finger of left  hand    Type 2 diabetes mellitus (HCC)    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    Past Surgical History:  Procedure Laterality Date   ANTERIOR CERVICAL DECOMP/DISCECTOMY FUSION N/A 04/25/2023   Procedure: C6-7 ANTERIOR CERVICAL DISCECTOMY AND FUSION (GLOBUS FORGE);  Surgeon: Claudene Penne ORN, MD;  Location: [facility] ORS;  Service: Neurosurgery;  Laterality: N/A;   BREAST EXCISIONAL BIOPSY Left 2004-2005   benign   BREAST SURGERY     CESAREAN SECTION  2006   CHOLECYSTECTOMY     COLONOSCOPY N/A 12/28/2023   Procedure: COLONOSCOPY;  Surgeon: Onita Elspeth Sharper, DO;  Location: [facility] ENDOSCOPY;  Service: Gastroenterology;  Laterality: N/A;  DM   COLONOSCOPY N/A 08/06/2024   Procedure: COLONOSCOPY;  Surgeon: Jinny Carmine, MD;  Location: [facility] ENDOSCOPY;  Service: Endoscopy;  Laterality: N/A;   ESOPHAGOGASTRODUODENOSCOPY N/A  08/06/2024   Procedure: EGD (ESOPHAGOGASTRODUODENOSCOPY);  Surgeon: Jinny Carmine, MD;  Location: [facility] ENDOSCOPY;  Service: Endoscopy;  Laterality: N/A;   ETHMOIDECTOMY Left 05/24/2022   Procedure: ETHMOIDECTOMY- TOTAL WITH FRONTAL SINUS EXPLORATION;  Surgeon: Blair Mt, MD;  Location: [facility];  Service: ENT;  Laterality: Left;  Diabetic   GALLBLADDER SURGERY     IMAGE GUIDED SINUS SURGERY Left 05/24/2022   Procedure: IMAGE GUIDED SINUS SURGERY;  Surgeon: Blair Mt, MD;  Location: [facility]  SURGERY CNTR;  Service: ENT;  Laterality: Left;  PLACED DISK ON OR CHARGE NURSE DESK 8-23 KP   MAXILLARY ANTROSTOMY Left 05/24/2022   Procedure: MAXILLARY ANTROSTOMY WITH TISSUE REMOVAL;  Surgeon: Blair Mt, MD;  Location: [facility] SURGERY CNTR;  Service: ENT;  Laterality: Left;   POLYPECTOMY  12/28/2023   Procedure: POLYPECTOMY, INTESTINE;  Surgeon: Onita Elspeth Sharper, DO;  Location: [facility] ENDOSCOPY;  Service: Gastroenterology;;   Social History[1] Family History  Problem Relation Age of Onset   Breast cancer Neg Hx     Allergies: Allergies[2]  Current antibiotics: Antibiotics Given (last 72 hours)     Date/Time Action Medication Dose Rate   08/07/24 1843 New Bag/Given   cefTRIAXone  (ROCEPHIN ) 2 g in sodium chloride  0.9 % 100 mL IVPB 2 g 200 mL/hr   08/08/24 1742 New Bag/Given   cefTRIAXone  (ROCEPHIN ) 2 g in sodium chloride  0.9 % 100 mL IVPB 2 g 200 mL/hr       MEDICATIONS:  DULoxetine   60 mg Oral Daily   enoxaparin  (LOVENOX ) injection  0.5 mg/kg Subcutaneous Q24H   furosemide   40 mg Oral Daily   insulin  aspart  0-9 Units Subcutaneous Q4H   pantoprazole   40 mg Oral Daily   rosuvastatin   10 mg Oral Daily   sodium chloride  flush  3 mL Intravenous Q12H   [START ON 08/10/2024] spironolactone   50 mg Oral Daily    Review of Systems - 11 systems reviewed and negative per HPI   OBJECTIVE: Temp:  [98.3 F (36.8 C)-100.6 F (38.1 C)] 100.6 F (38.1 C) (12/19 1538) Pulse Rate:  [87-111] 111 (12/19 1538) Resp:  [16-18] 16 (12/19 1538) BP: (106-127)/(58-93) 127/85 (12/19 1538) SpO2:  [95 %-97 %] 95 % (12/19 1538) Constitutional:  oriented to person, place, and time. appears well-developed and well-nourished. No distress.  HENT: [place]/AT, PERRLA, no scleral icterus Mouth/Throat: Oropharynx is clear and moist. No oropharyngeal exudate.  Cardiovascular: Normal rate, regular rhythm and normal heart sounds. Exam reveals no gallop and no friction rub.  No murmur heard.   Pulmonary/Chest: Effort normal and breath sounds normal. No respiratory distress.  has no wheezes.  Neck = supple, no nuchal rigidity Abdominal: Soft. Abd distention. Non tender Lymphadenopathy: no cervical adenopathy. No axillary adenopathy Neurological: alert and oriented to person, place, and time.  Skin: Skin is warm and dry. No rash noted. No erythema.  Psychiatric: a normal mood and affect.  behavior is normal.    LABS: Results for orders placed or performed during the hospital encounter of 08/04/24 (from the past 48 hours)  Glucose, capillary     Status: None   Collection Time: 08/07/24  8:10 PM  Result Value Ref Range   Glucose-Capillary 84 70 - 99 mg/dL    Comment: Glucose reference range applies only to samples taken after fasting for at least 8 hours.  Glucose, capillary     Status: None   Collection Time: 08/07/24 11:59 PM  Result Value Ref Range   Glucose-Capillary 93 70 - 99 mg/dL  Comment: Glucose reference range applies only to samples taken after fasting for at least 8 hours.  Glucose, capillary     Status: None   Collection Time: 08/08/24  4:15 AM  Result Value Ref Range   Glucose-Capillary 96 70 - 99 mg/dL    Comment: Glucose reference range applies only to samples taken after fasting for at least 8 hours.   Comment 1 Notify RN   Strongyloides, Ab, IgG     Status: None   Collection Time: 08/08/24  6:18 AM  Result Value Ref Range   Strongyloides, Ab, IgG Negative Negative    Comment: (NOTE) Performed At: [facility] [address] [place], [place] [id number] Jennette Shorter MD Ey:[phone]   Basic metabolic panel     Status: Abnormal   Collection Time: 08/08/24  6:18 AM  Result Value Ref Range   Sodium 141 135 - 145 mmol/L   Potassium 3.4 (L) 3.5 - 5.1 mmol/L   Chloride 107 98 - 111 mmol/L   CO2 24 22 - 32 mmol/L   Glucose, Bld 95 70 - 99 mg/dL    Comment: Glucose reference range applies only to samples taken after fasting for at least 8  hours.   BUN 8 6 - 20 mg/dL   Creatinine, Ser 9.61 (L) 0.44 - 1.00 mg/dL   Calcium  8.5 (L) 8.9 - 10.3 mg/dL   GFR, Estimated >39 >39 mL/min    Comment: (NOTE) Calculated using the CKD-EPI Creatinine Equation (2021)    Anion gap 11 5 - 15    Comment: Performed at [facility], [address] [place], [place] [address]  Sedimentation rate     Status: None   Collection Time: 08/08/24  6:18 AM  Result Value Ref Range   Sed Rate 4 0 - 30 mm/hr    Comment: Performed at [facility], [address] [place], [place] [address]  C-reactive protein     Status: Abnormal   Collection Time: 08/08/24  6:18 AM  Result Value Ref Range   CRP <3.0 (H) <1.0 mg/dL    Comment: Performed at [facility], [address], [place], [place] [address]  CBC with Differential/Platelet     Status: Abnormal   Collection Time: 08/08/24  6:18 AM  Result Value Ref Range   WBC 16.7 (H) 4.0 - 10.5 K/uL   RBC 4.36 3.87 - 5.11 MIL/uL   Hemoglobin 12.6 12.0 - 15.0 g/dL   HCT 62.0 63.9 - 53.9 %   MCV 86.9 80.0 - 100.0 fL   MCH 28.9 26.0 - 34.0 pg   MCHC 33.2 30.0 - 36.0 g/dL   RDW 86.6 88.4 - 84.4 %   Platelets 171 150 - 400 K/uL   nRBC 0.0 0.0 - 0.2 %   Neutrophils Relative % 26 %   Neutro Abs 4.3 1.7 - 7.7 K/uL   Lymphocytes Relative 15 %   Lymphs Abs 2.6 0.7 - 4.0 K/uL   Monocytes Relative 2 %   Monocytes Absolute 0.3 0.1 - 1.0 K/uL   Eosinophils Relative 57 %   Eosinophils Absolute 9.4 (H) 0.0 - 0.5 K/uL   Basophils Relative 0 %   Basophils Absolute 0.1 0.0 - 0.1 K/uL   WBC Morphology MORPHOLOGY UNREMARKABLE    RBC Morphology MORPHOLOGY UNREMARKABLE    Smear Review Normal platelet morphology    Immature Granulocytes 0 %   Abs Immature Granulocytes 0.04 0.00 - 0.07 K/uL    Comment: Performed at [facility],  [address] [place], [place] [address]  Pathologist smear review     Status: None   Collection Time: 08/08/24  6:18 AM  Result Value Ref Range   Path  Review Blood smear is reviewed.     Comment: Leukocytosis with absolute eosinophilia. Unremarkable WBC, RBC, and platelet morphology. Peripheral eosinophilia is a non specific findings and possible etiologies include parasitic infection, allergy, medications, or systemic disease associated with hypereosinophilia. Reviewed by Rexene MOTE Janel, M.D. Performed at [facility], [address] [place], [place] [address]   Glucose, capillary     Status: None   Collection Time: 08/08/24  8:08 AM  Result Value Ref Range   Glucose-Capillary 97 70 - 99 mg/dL    Comment: Glucose reference range applies only to samples taken after fasting for at least 8 hours.  Glucose, capillary     Status: Abnormal   Collection Time: 08/08/24 11:27 AM  Result Value Ref Range   Glucose-Capillary 101 (H) 70 - 99 mg/dL    Comment: Glucose reference range applies only to samples taken after fasting for at least 8 hours.  Glucose, capillary     Status: Abnormal   Collection Time: 08/08/24  3:27 PM  Result Value Ref Range   Glucose-Capillary 123 (H) 70 - 99 mg/dL    Comment: Glucose reference range applies only to samples taken after fasting for at least 8 hours.  Glucose, capillary     Status: Abnormal   Collection Time: 08/08/24  8:42 PM  Result Value Ref Range   Glucose-Capillary 120 (H) 70 - 99 mg/dL    Comment: Glucose reference range applies only to samples taken after fasting for at least 8 hours.  Glucose, capillary     Status: Abnormal   Collection Time: 08/09/24 12:29 AM  Result Value Ref Range   Glucose-Capillary 122 (H) 70 - 99 mg/dL    Comment: Glucose reference range applies only to samples taken after fasting for at least 8 hours.  Glucose, capillary     Status: Abnormal   Collection Time: 08/09/24  4:42 AM  Result Value Ref Range   Glucose-Capillary 131 (H) 70 - 99 mg/dL    Comment: Glucose reference range applies only to samples taken after fasting for at least 8 hours.  CBC      Status: Abnormal   Collection Time: 08/09/24  5:25 AM  Result Value Ref Range   WBC 17.2 (H) 4.0 - 10.5 K/uL   RBC 4.64 3.87 - 5.11 MIL/uL   Hemoglobin 13.1 12.0 - 15.0 g/dL   HCT 58.7 63.9 - 53.9 %   MCV 88.8 80.0 - 100.0 fL   MCH 28.2 26.0 - 34.0 pg   MCHC 31.8 30.0 - 36.0 g/dL   RDW 86.4 88.4 - 84.4 %   Platelets 189 150 - 400 K/uL   nRBC 0.0 0.0 - 0.2 %    Comment: Performed at [facility], [address], [place], [place] [address]  Basic metabolic panel     Status: Abnormal   Collection Time: 08/09/24  5:25 AM  Result Value Ref Range   Sodium 140 135 - 145 mmol/L   Potassium 3.2 (L) 3.5 - 5.1 mmol/L   Chloride 104 98 - 111 mmol/L   CO2 28 22 - 32 mmol/L   Glucose, Bld 114 (H) 70 - 99 mg/dL    Comment: Glucose reference range applies only to samples taken after fasting for at least 8 hours.   BUN 7 6 -  20 mg/dL   Creatinine, Ser 9.45 0.44 - 1.00 mg/dL   Calcium  8.0 (L) 8.9 - 10.3 mg/dL   GFR, Estimated >39 >39 mL/min    Comment: (NOTE) Calculated using the CKD-EPI Creatinine Equation (2021)    Anion gap 8 5 - 15    Comment: Performed at [facility], [address] [place], [place] [address]  Glucose, capillary     Status: Abnormal   Collection Time: 08/09/24  8:35 AM  Result Value Ref Range   Glucose-Capillary 116 (H) 70 - 99 mg/dL    Comment: Glucose reference range applies only to samples taken after fasting for at least 8 hours.  Glucose, capillary     Status: Abnormal   Collection Time: 08/09/24 11:49 AM  Result Value Ref Range   Glucose-Capillary 128 (H) 70 - 99 mg/dL    Comment: Glucose reference range applies only to samples taken after fasting for at least 8 hours.   No components found for: ESR, C REACTIVE PROTEIN MICRO: Recent Results (from the past 720 hours)  Blood culture (routine x 2)     Status: None   Collection Time: 08/04/24  3:56 AM   Specimen: BLOOD  Result Value Ref Range Status   Specimen Description BLOOD BLOOD  LEFT HAND  Final   Special Requests   Final    BOTTLES DRAWN AEROBIC AND ANAEROBIC Blood Culture results may not be optimal due to an inadequate volume of blood received in culture bottles   Culture   Final    NO GROWTH 5 DAYS Performed at [facility], [address] [place], [place] [address]    Report Status 08/09/2024 FINAL  Final  Blood culture (routine x 2)     Status: None   Collection Time: 08/04/24  4:06 AM   Specimen: BLOOD  Result Value Ref Range Status   Specimen Description BLOOD BLOOD RIGHT HAND  Final   Special Requests   Final    BOTTLES DRAWN AEROBIC AND ANAEROBIC Blood Culture results may not be optimal due to an inadequate volume of blood received in culture bottles   Culture   Final    NO GROWTH 5 DAYS Performed at [facility], [address] [place], [place] [address]    Report Status 08/09/2024 FINAL  Final  C Difficile Quick Screen w PCR reflex     Status: None   Collection Time: 08/04/24  4:36 AM   Specimen: STOOL  Result Value Ref Range Status   C Diff antigen NEGATIVE NEGATIVE Final   C Diff toxin NEGATIVE NEGATIVE Final   C Diff interpretation No C. difficile detected.  Final    Comment: Performed at [facility], [address] [place], [place] [address]  Gastrointestinal Panel by PCR , Stool     Status: None   Collection Time: 08/04/24  4:36 AM   Specimen: STOOL  Result Value Ref Range Status   Campylobacter species NOT DETECTED NOT DETECTED Final   Plesimonas shigelloides NOT DETECTED NOT DETECTED Final   Salmonella species NOT DETECTED NOT DETECTED Final   Yersinia enterocolitica NOT DETECTED NOT DETECTED Final   Vibrio species NOT DETECTED NOT DETECTED Final   Vibrio cholerae NOT DETECTED NOT DETECTED Final   Enteroaggregative E coli (EAEC) NOT DETECTED NOT DETECTED Final   Enteropathogenic E coli (EPEC) NOT DETECTED NOT DETECTED Final   Enterotoxigenic E coli (ETEC) NOT DETECTED NOT DETECTED Final   Shiga like  toxin producing E coli (STEC) NOT  DETECTED NOT DETECTED Final   Shigella/Enteroinvasive E coli (EIEC) NOT DETECTED NOT DETECTED Final   Cryptosporidium NOT DETECTED NOT DETECTED Final   Cyclospora cayetanensis NOT DETECTED NOT DETECTED Final   Entamoeba histolytica NOT DETECTED NOT DETECTED Final   Giardia lamblia NOT DETECTED NOT DETECTED Final   Adenovirus F40/41 NOT DETECTED NOT DETECTED Final   Astrovirus NOT DETECTED NOT DETECTED Final   Norovirus GI/GII NOT DETECTED NOT DETECTED Final   Rotavirus A NOT DETECTED NOT DETECTED Final   Sapovirus (I, II, IV, and V) NOT DETECTED NOT DETECTED Final    Comment: Performed at [facility], [address] [place], [place] [address]  Body fluid culture w Gram Stain     Status: None (Preliminary result)   Collection Time: 08/07/24 11:29 AM   Specimen: PATH Cytology Peritoneal fluid  Result Value Ref Range Status   Specimen Description   Final    PERITONEAL Performed at [facility], [address], [place], [place] [address]    Special Requests   Final    NONE Performed at [facility], [address] [place], [place] [address]    Gram Stain   Final    FEW WBC PRESENT, PREDOMINANTLY PMN NO ORGANISMS SEEN    Culture   Final    NO GROWTH 2 DAYS Performed at [facility], [address], [place], [place] [address]    Report Status PENDING  Incomplete    IMAGING: US  Paracentesis Result Date: 08/07/2024 INDICATION: 58 year old female with a history of NASH who presented to the ED on 08/04/2024 with abdominal pain and several weeks of intermittent diarrhea and bloating. Noted to have new onset ascites. Request for diagnostic and therapeutic paracentesis. EXAM: ULTRASOUND GUIDED DIAGNOSTIC AND THERAPEUTIC, LEFT-SIDED PARACENTESIS MEDICATIONS: 1% lidocaine , 10 mL. COMPLICATIONS: None immediate. PROCEDURE: Informed written consent was obtained from the patient after a discussion of the risks, benefits and  alternatives to treatment. A timeout was performed prior to the initiation of the procedure. Initial ultrasound scanning demonstrates a large amount of ascites within the left lower abdominal quadrant. The left lower abdomen was prepped and draped in the usual sterile fashion. 1% lidocaine  was used for local anesthesia. Following this, a 19 gauge, 10-cm, Yueh catheter was introduced. An ultrasound image was saved for documentation purposes. The paracentesis was performed. The catheter was removed and a dressing was applied. The patient tolerated the procedure well without immediate post procedural complication. FINDINGS: A total of approximately 1.8 L of amber fluid was removed. Samples were sent to the laboratory as requested by the clinical team. IMPRESSION: Successful ultrasound-guided paracentesis yielding 1.8 liters of peritoneal fluid. Procedure performed by: Sherrilee Bal, PA-C under the supervision of Dr. KANDICE Banner Electronically Signed   By: Cordella Banner   On: 08/07/2024 13:37   US  ASCITES (ABDOMEN LIMITED) Result Date: 08/04/2024 CLINICAL DATA:  Ascites. EXAM: LIMITED ABDOMEN ULTRASOUND FOR ASCITES TECHNIQUE: Limited ultrasound survey for ascites was performed in all four abdominal quadrants. COMPARISON:  CT scan from earlier same day FINDINGS: Moderate volume ascites in all 4 quadrants. IMPRESSION: Positive for ascites. Electronically Signed   By: Camellia Candle M.D.   On: 08/04/2024 08:35   CT ABDOMEN PELVIS W CONTRAST Result Date: 08/04/2024 EXAM: CT ABDOMEN AND PELVIS WITH CONTRAST 08/04/2024 02:14:44 AM TECHNIQUE: CT of the abdomen and pelvis was performed with the administration of 100 mL of iohexol  (OMNIPAQUE ) 300 MG/ML solution. Multiplanar reformatted images are provided for review. Automated exposure control, iterative  reconstruction, and/or weight-based adjustment of the mA/kV was utilized to reduce the radiation dose to as low as reasonably achievable. COMPARISON: 10/24/2013.  CLINICAL HISTORY: upper abd pain, diarrhea, HR 120, WBC 21 FINDINGS: LOWER CHEST: Lung bases are free from acute infiltrate or sizable effusion. LIVER: The liver is within normal limits. GALLBLADDER AND BILE DUCTS: The gallbladder has been surgically removed. No biliary ductal dilatation. SPLEEN: The spleen is within normal limits. PANCREAS: The pancreas is within normal limits. ADRENAL GLANDS: The adrenal glands are unremarkable. KIDNEYS, URETERS AND BLADDER: The kidneys are unremarkable. No stones in the kidneys or ureters. No hydronephrosis. The bladder is decompressed. GI AND BOWEL: Thickening of the distal esophagus is noted, likely related to reflux. This is stable from the prior exam. The stomach is unremarkable. Distal small bowel shows some wall thickening .this is similar to that seen on the prior exam from 2015 and may simply be reactive to the underlying ascites. Possibility of underlying inflammatory bowel disease deserves consideration. No fistulization is seen. No obstructive or inflammatory changes of the colon are seen. The appendix is within normal limits. PERITONEUM AND RETROPERITONEUM: Diffuse moderate ascites is noted throughout the abdomen. No free air. VASCULATURE: Aorta is normal in caliber. LYMPH NODES: No lymphadenopathy. REPRODUCTIVE ORGANS: The uterus is within normal limits. No definitive ovarian lesion is seen. BONES AND SOFT TISSUES: No acute osseous abnormality. No focal soft tissue abnormality. IMPRESSION: 1. Diffuse moderate ascites. This is stable from the prior CT examination but increased when compared with the prior ultrasound from 2021. 2. Distal small bowel wall thickening, similar to prior exam, possibly reactive to ascites; underlying inflammatory bowel disease could be considered. No fistulization. The overall appearance is stable from the prior CT examination. Electronically signed by: Oneil Devonshire MD 08/04/2024 02:26 AM EST RP Workstation: [id number]    Assessment:    Tonya Mueller is a 58 y.o. female with peripheral eosinophilia, abd pain, ascitics with profound eosinophilia. She is from [place]. HIV neg. Strongyloides negative. Onc workup pending  Recommendations Peripheral eosinophila and eosinophilic ascites Check coccidiomycosis, stool O and p, histoplasma.  Given prior eosinophilia several years ago and prior CT in 2015 with moderate ascites this is likely not infectious given prolonged nature  Thank you very much for allowing me to participate in the care of this patient. Please call with questions.   Alm SQUIBB. Epifanio, MD       [1]  Social History Tobacco Use   Smoking status: Never   Smokeless tobacco: Never  Vaping Use   Vaping status: Never Used  Substance Use Topics   Alcohol use: No   Drug use: No  [2]  Allergies Allergen Reactions   Cortisone Other (See Comments) and Swelling    Patient experienced severe stomach pains and bloating; 05/24/22: pt has had an injection recently without issues  Liver failure  Liver failure - subacute & reversible; diagnosed after she had severe bloating & stomach pain after numerous cortisone injections over a 6 month timeframe. Has had isolated cortisone injections which were better spaced out without these difficulties & has tolerated oral steroids without difficulty in short courses as well. This reaction only occurred after the multiple injections within that 6 months.   Liver failure - subacute & reversible; diagnosed after she had severe bloating & stomach pain after numerous cortisone injections over a 6 month timeframe. Has had isolated cortisone injections which were better spaced out without these difficulties & has tolerated oral steroids without difficulty in short courses  as well. This reaction only occurred after the multiple injections within that 6 months.

## 2024-08-09 NOTE — Plan of Care (Signed)

## 2024-08-09 NOTE — Assessment & Plan Note (Signed)
 Replace potassium today and increase Aldactone .

## 2024-08-09 NOTE — Progress Notes (Signed)
 This patient had a paracentesis done on her ascitic fluid that showed:  Component     Latest Ref Rng 08/07/2024  Fluid Type-FCT CYTO PERI   Color, Fluid     YELLOW  YELLOW   Appearance, Fluid     CLEAR  CLOUDY !   Total Nucleated Cell Count, Fluid     cu mm 8,591   Neutrophil Count, Fluid     % 2   Lymphs, Fluid     % 5   Monocyte-Macrophage-Serous Fluid     % 9   Eos, Fluid     % 84    The absolute neutrophil cell count was under 250 and consistent with spontaneous bacterial peritonitis.  The patient's blood work also showed an elevated white cell count with an elevated absolute neutrophil count.  The patient's EGD and colonoscopy did not show any abnormalities and biopsies of the small intestines and random colon biopsies showed:  FINAL DIAGNOSIS        1. Small Intestine Biopsy, cbx :       DUODENAL MUCOSA WITH BRUNNER GLAND HYPERPLASIA AND FOVEOLAR METAPLASIA       CONSISTENT WITH CHRONIC PEPTIC DUODENITIS.       NO EVIDENCE OF VILLOUS BLUNTING OR INTRAEPITHELIAL LYMPHOCYTOSIS.       NO EVIDENCE OF ACTIVITY, CHRONICITY, GRANULOMA, DYSPLASIA OR MALIGNANCY.        2. Colon, biopsy, cbx random :       COLONIC MUCOSA WITH NO SIGNIFICANT DIAGNOSTIC ALTERATION.       NO EVIDENCE OF LYMPHOCYTIC COLITIS OR COLLAGENOUS COLITIS.       NEGATIVE FOR ACTIVITY, CHRONICITY, GRANULOMA, DYSPLASIA OR MALIGNANCY.   Peripheral blood smear was reported as: Unremarkable WBC, RBC, and platelet morphology.  Peripheral eosinophilia is a non specific findings and possible etiologies include parasitic infection, allergy, medications, or systemic disease associated with hypereosinophilia.   I would recommend a hematology and possible ID consults prior to starting steroids for possible eosinophilic disease.

## 2024-08-10 DIAGNOSIS — R197 Diarrhea, unspecified: Secondary | ICD-10-CM | POA: Diagnosis not present

## 2024-08-10 DIAGNOSIS — R188 Other ascites: Secondary | ICD-10-CM | POA: Diagnosis not present

## 2024-08-10 DIAGNOSIS — D721 Eosinophilia, unspecified: Secondary | ICD-10-CM | POA: Diagnosis not present

## 2024-08-10 DIAGNOSIS — K30 Functional dyspepsia: Secondary | ICD-10-CM | POA: Diagnosis not present

## 2024-08-10 LAB — GLUCOSE, CAPILLARY
Glucose-Capillary: 106 mg/dL — ABNORMAL HIGH (ref 70–99)
Glucose-Capillary: 107 mg/dL — ABNORMAL HIGH (ref 70–99)
Glucose-Capillary: 121 mg/dL — ABNORMAL HIGH (ref 70–99)
Glucose-Capillary: 124 mg/dL — ABNORMAL HIGH (ref 70–99)
Glucose-Capillary: 126 mg/dL — ABNORMAL HIGH (ref 70–99)
Glucose-Capillary: 135 mg/dL — ABNORMAL HIGH (ref 70–99)
Glucose-Capillary: 147 mg/dL — ABNORMAL HIGH (ref 70–99)

## 2024-08-10 LAB — BASIC METABOLIC PANEL WITH GFR
Anion gap: 7 (ref 5–15)
BUN: 7 mg/dL (ref 6–20)
CO2: 32 mmol/L (ref 22–32)
Calcium: 8.5 mg/dL — ABNORMAL LOW (ref 8.9–10.3)
Chloride: 101 mmol/L (ref 98–111)
Creatinine, Ser: 0.52 mg/dL (ref 0.44–1.00)
GFR, Estimated: >60 mL/min
Glucose, Bld: 105 mg/dL — ABNORMAL HIGH (ref 70–99)
Potassium: 3.8 mmol/L (ref 3.5–5.1)
Sodium: 140 mmol/L (ref 135–145)

## 2024-08-10 LAB — CBC WITH DIFFERENTIAL/PLATELET
Abs Immature Granulocytes: 0.03 K/uL (ref 0.00–0.07)
Basophils Absolute: 0.1 K/uL (ref 0.0–0.1)
Basophils Relative: 0 %
Eosinophils Absolute: 11.2 K/uL — ABNORMAL HIGH (ref 0.0–0.5)
Eosinophils Relative: 63 %
HCT: 41.7 % (ref 36.0–46.0)
Hemoglobin: 13.4 g/dL (ref 12.0–15.0)
Immature Granulocytes: 0 %
Lymphocytes Relative: 14 %
Lymphs Abs: 2.6 K/uL (ref 0.7–4.0)
MCH: 28.5 pg (ref 26.0–34.0)
MCHC: 32.1 g/dL (ref 30.0–36.0)
MCV: 88.5 fL (ref 80.0–100.0)
Monocytes Absolute: 0.3 K/uL (ref 0.1–1.0)
Monocytes Relative: 2 %
Neutro Abs: 3.8 K/uL (ref 1.7–7.7)
Neutrophils Relative %: 21 %
Platelets: 205 K/uL (ref 150–400)
RBC: 4.71 MIL/uL (ref 3.87–5.11)
RDW: 13.4 % (ref 11.5–15.5)
Smear Review: NORMAL
WBC: 17.9 K/uL — ABNORMAL HIGH (ref 4.0–10.5)
nRBC: 0 % (ref 0.0–0.2)

## 2024-08-10 LAB — BODY FLUID CULTURE W GRAM STAIN: Culture: NO GROWTH

## 2024-08-10 LAB — MAGNESIUM: Magnesium: 1.9 mg/dL (ref 1.7–2.4)

## 2024-08-10 LAB — IGE: IgE (Immunoglobulin E), Serum: 768 [IU]/mL — ABNORMAL HIGH (ref 6–495)

## 2024-08-10 LAB — PRO BRAIN NATRIURETIC PEPTIDE: Pro Brain Natriuretic Peptide: <50 pg/mL

## 2024-08-10 NOTE — Progress Notes (Signed)
 " Progress Note   Patient: Tonya Mueller FMW:[id number] DOB: 1966/04/01 DOA: 08/04/2024     6 DOS: the patient was seen and examined on 08/10/2024   Brief hospital course: 58 y.o. female with medical history significant of diabetes mellitus 2 on Ozempic, GERD, NASH, recent diagnosis of sleep apnea now on CPAP, obesity with BMI 43, dyslipidemia, cervical radiculopathy/cervical spinal stenosis with history of prior injection therapies to treat pain.  Patient presented to the ED complaining of 3 weeks of upper abdominal pain with diarrhea worse after eating    12/15: GI consult 12/16: EGD and colonoscopy appearance is negative but biopsies were taken, added Phenergan for refractory nausea and vomiting, try clear liquid diet 12/17.  Advance to solid food.  Discontinue IV fluids.  Paracentesis showing eosinophils.  Started on empiric antibiotics. 12/18.  CBC with differential showing eosinophilia, sed rate of 4 12/19.  ANA, ANCA, Strongyloides, IgE still pending.  Consulted oncology.  Oncology ordered BCR-ABL, flow cytometry, myeloid NGS, pro BNP, T-cell panel and tryptase. 12/20.  Patient feels a little bit better today.  Still has abdominal distention.  Still has some diarrhea.  Eating a little bit better.  Assessment and Plan: * Abdominal ascites High eosinophil count in abdominal fluid.  Started on empiric Rocephin  for suspected SBP.  So far cultures are negative.  Increased Aldactone  and low-dose Lasix .  Sedimentation rate normal.  CRP low.  ANA and ANCA are negative..  IgG Strongyloides antibody negative. Cytology negative.  Case discussed with oncology, gastroenterology and infectious disease specialist.  Potentially will decide on treating empirically for Strongyloides but will have infectious disease opinion first.  Oncology sent off testing which is still pending.  Will repeat another paracentesis on Monday.  Eosinophilia, unspecified Eosinophils seen in ascitic fluid and on CBC with  differential.  Undergoing workup.  On empiric antibiotic.  Consulted oncology and she ordered a BCR-ABL, flow cytometry, myeloid NGS, proBNP, T-cell panel and tryptase.  Cytology of the ascites fluid negative.  Stomach upset EGD negative.  Biopsy consistent with chronic peptic duodenitis.  Continue Protonix .  Diarrhea Stool studies negative.  Colonoscopy appeared normal.  Biopsies taken.  As needed Imodium .  Send off ova and parasites.  Controlled type 2 diabetes mellitus without complication, without long-term current use of insulin  (HCC) Hold Ozempic  Hyperlipidemia, unspecified Restarting statin  Sleep apnea On CPAP  Obesity, Class III, BMI 40-49.9 (morbid obesity) (HCC) Continue to monitor with starting diuresis.  BMI 43.27  Hypokalemia Increased Aldactone .        Subjective: Patient feels a little bit better today.  Still has abdominal pains.  Still has diarrhea.  Eating a little bit better today.  Admitted with nausea vomiting and diarrhea.  Physical Exam: Vitals:   08/09/24 1606 08/09/24 2057 08/10/24 0338 08/10/24 0813  BP: (!) 122/94 133/76 116/75 124/73  Pulse: (!) 116 100 90 99  Resp: 20 16 16 19   Temp: 98.1 F (36.7 C) 97.9 F (36.6 C) 98 F (36.7 C) 98.4 F (36.9 C)  TempSrc:      SpO2: 94% 95% 95% 100%  Weight:      Height:       Physical Exam HENT:     Head: Normocephalic.  Eyes:     General: Lids are normal.     Conjunctiva/sclera: Conjunctivae normal.  Cardiovascular:     Rate and Rhythm: Normal rate and regular rhythm.     Heart sounds: Normal heart sounds, S1 normal and S2 normal.  Pulmonary:  Breath sounds: No decreased breath sounds, wheezing, rhonchi or rales.  Abdominal:     General: There is distension.     Palpations: Abdomen is soft.     Tenderness: There is generalized abdominal tenderness.  Musculoskeletal:     Right lower leg: Swelling present.     Left lower leg: Swelling present.  Skin:    General: Skin is warm.      Findings: No rash.  Neurological:     Mental Status: She is alert and oriented to person, place, and time.     Data Reviewed: ANA negative, ANCA negative, IgE elevated at 768, proBNP negative, creatinine 0.52, electrolytes normal range today, white blood cell count 17.9, eosinophil absolute 11.2, hemoglobin 13.4, platelet count 205.  Strongyloides antibody negative  Family Communication: Daughter at bedside  Disposition: Status is: Inpatient Remains inpatient appropriate because: Trying to figure out a diagnosis.  Will get another paracentesis on Monday.  Oncology will order a bone marrow biopsy for Monday.  Planned Discharge Destination: Home    Time spent: 40 minutes Case discussed with gastroenterology and oncology.  Case was discussed with infectious disease specialist.  Author: Charlie Patterson, MD 08/10/2024 3:12 PM  For on call review [email or web address].  "

## 2024-08-10 NOTE — Consult Note (Addendum)
 "  Hematology/Oncology Consult note [facility] Telephone:([phone] [phone]  Patient Care Team: [name], NP as PCP - General (Family Medicine)   Name of the patient: Tonya Mueller  [id number]  1966/04/20    Reason for consult: Eosinophilia   Requesting physician: Dr. Josette  Date of visit: 08/09/2024   History of presenting illness-patient is a 58 year old female with a past medical history significant for GERD, fatty liver, sleep apnea on CPAP, diabetes who presented to the ER about 5 days ago with complaints of abdominal pain distention and diarrhea.  She had visited [facility] in February 2025.   On admission patient underwent CT abdomen pelvis with contrast which showed diffuse moderate ascites.  Distal small bowel thickening, no evidence of lymphadenopathy or splenomegaly.  Patient underwent paracentesis.  Cytology negative for malignancy.SABRA  aFP negative.  Body fluid cell count was showing elevated white blood cells of 8591 with 84% eosinophils.  Ascitic fluid protein is still pending.  Cultures negative.  Systemic workup so far shows peripheral blood leukocytosis with a white cell count which has remained around 17 with peripheral eosinophilia with an absolute eosinophil count of 9.4.  proBNP normal.  IgE elevated at 768.  ANA negative.  ANCA negative.  ESR and CRP are normal.  IgG Strongyloides antibody testing negative.  Stool ova and parasites currently pending.  Pathology report smear review shows absolute eosinophilia but no other abnormal findings.  Chest x-ray did not show any infiltrates.  Urinalysis showed  trace protein 30.  B12 normal at 409.  Iron studies normal.  CMP normal.  CBC does not show any evidence of other cytopenias.  Patient underwent EGD and colonoscopy by Dr. Jinny.  EGD was normal and duodenal biopsies showed chronic peptic duodenitis.    Colonic biopsies also did not show any evidence of granuloma or dysplasia or  malignancy.  Patient is afebrile but still complains of abdominal bloating and inability to eat much.  She still has on and off abdominal pain  ECOG PS- 1  Pain scale- 4   Review of systems- Review of Systems  Constitutional:  Negative for chills, fever, malaise/fatigue and weight loss.  HENT:  Negative for congestion, ear discharge and nosebleeds.   Eyes:  Negative for blurred vision.  Respiratory:  Negative for cough, hemoptysis, sputum production, shortness of breath and wheezing.   Cardiovascular:  Negative for chest pain, palpitations, orthopnea and claudication.  Gastrointestinal:  Positive for abdominal pain. Negative for blood in stool, constipation, diarrhea, heartburn, melena, nausea and vomiting.       Abdominal distention  Genitourinary:  Negative for dysuria, flank pain, frequency, hematuria and urgency.  Musculoskeletal:  Negative for back pain, joint pain and myalgias.  Skin:  Negative for rash.  Neurological:  Negative for dizziness, tingling, focal weakness, seizures, weakness and headaches.  Endo/Heme/Allergies:  Does not bruise/bleed easily.  Psychiatric/Behavioral:  Negative for depression and suicidal ideas. The patient does not have insomnia.     Allergies[1]  Patient Active Problem List   Diagnosis Date Noted   Hypokalemia 08/09/2024   Eosinophilia, unspecified 08/08/2024   SBP (spontaneous bacterial peritonitis) (HCC) 08/08/2024   Abdominal ascites 08/07/2024   Controlled type 2 diabetes mellitus without complication, without long-term current use of insulin  (HCC) 08/07/2024   Hyperlipidemia, unspecified 08/07/2024   Sleep apnea 08/07/2024   Obesity, Class III, BMI 40-49.9 (morbid obesity) (HCC) 08/07/2024   Diarrhea 08/06/2024   Stomach upset 08/06/2024   S/P cervical spinal fusion 04/25/2023  Cervical disc disorder with radiculopathy of cervical region 04/05/2023   Left arm weakness 04/05/2023     Past Medical History:  Diagnosis Date    Allergic rhinitis    Angio-edema    Cervical radiculopathy    Chronic sinusitis    COVID-19    Elevated liver enzymes    Fatty liver disease, nonalcoholic    GERD (gastroesophageal reflux disease)    Peroneal tendonitis, left    Plantar fasciitis    Situational depression    Trigger finger of left  hand    Type 2 diabetes mellitus (HCC)    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      Past Surgical History:  Procedure Laterality Date   ANTERIOR CERVICAL DECOMP/DISCECTOMY FUSION N/A 04/25/2023   Procedure: C6-7 ANTERIOR CERVICAL DISCECTOMY AND FUSION (GLOBUS FORGE);  Surgeon: Claudene Penne ORN, MD;  Location: [facility] ORS;  Service: Neurosurgery;  Laterality: N/A;   BREAST EXCISIONAL BIOPSY Left 2004-2005   benign   BREAST SURGERY     CESAREAN SECTION  2006   CHOLECYSTECTOMY     COLONOSCOPY N/A 12/28/2023   Procedure: COLONOSCOPY;  Surgeon: Onita Elspeth Sharper, DO;  Location: [facility] END[facility];  Service: Gastroenterology;  Laterality: N/A;  DM   COLONOSCOPY N/A 08/06/2024   Procedure: COLONOSCOPY;  Surgeon: Jinny Carmine, MD;  Location: [facility] ENDOSCOPY;  Service: Endoscopy;  Laterality: N/A;   ESOPHAGOGASTRODUODENOSCOPY N/A 08/06/2024   Procedure: EGD (ESOPHAGOGASTRODUODENOSCOPY);  Surgeon: Jinny Carmine, MD;  Location: [facility] ENDOSCOPY;  Service: Endoscopy;  Laterality: N/A;   ETHMOIDECTOMY Left 05/24/2022   Procedure: ETHMOIDECTOMY- TOTAL WITH FRONTAL SINUS EXPLORATION;  Surgeon: Blair Mt, MD;  Location: [facility];  Service: ENT;  Laterality: Left;  Diabetic   GALLBLADDER SURGERY     IMAGE GUIDED SINUS SURGERY Left 05/24/2022   Procedure: IMAGE GUIDED SINUS SURGERY;  Surgeon: Blair Mt, MD;  Location: [facility] SURGERY CNTR;  Service: ENT;  Laterality: Left;  PLACED DISK ON OR CHARGE NURSE DESK 8-23 KP   MAXILLARY ANTROSTOMY Left 05/24/2022   Procedure: MAXILLARY ANTROSTOMY WITH TISSUE REMOVAL;  Surgeon: Blair Mt, MD;  Location: [facility];  Service: ENT;  Laterality: Left;    POLYPECTOMY  12/28/2023   Procedure: POLYPECTOMY, INTESTINE;  Surgeon: Onita Elspeth Sharper, DO;  Location: [facility] ENDOSCOPY;  Service: Gastroenterology;;    Social History   Socioeconomic History   Marital status: Married    Spouse name: Scottie   Number of children: Not on file   Years of education: Not on file   Highest education level: Not on file  Occupational History   Not on file  Tobacco Use   Smoking status: Never   Smokeless tobacco: Never  Vaping Use   Vaping status: Never Used  Substance and Sexual Activity   Alcohol use: No   Drug use: No   Sexual activity: Not on file  Other Topics Concern   Not on file  Social History Narrative   Not on file   Social Drivers of Health   Tobacco Use: Low Risk (08/06/2024)   Patient History    Smoking Tobacco Use: Never    Smokeless Tobacco Use: Never    Passive Exposure: Not on file  Financial Resource Strain: High Risk (03/01/2024)   Received from [facility]   Overall Financial Resource Strain (CARDIA)    Difficulty of Paying Living Expenses: Hard  Food Insecurity: No Food Insecurity (08/04/2024)   Epic    Worried About Running Out of Food in the Last Year: Never  true    Ran Out of Food in the Last Year: Never true  Transportation Needs: No Transportation Needs (08/04/2024)   Epic    Lack of Transportation (Medical): No    Lack of Transportation (Non-Medical): No  Physical Activity: Inactive (11/08/2023)   Received from [facility]   Exercise Vital Sign    On average, how many days per week do you engage in moderate to strenuous exercise (like a brisk walk)?: 0 days    On average, how many minutes do you engage in exercise at this level?: 0 min  Stress: No Stress Concern Present (11/08/2023)   Received from [facility] of Occupational Health - Occupational Stress Questionnaire    Feeling of Stress : Not at all  Social Connections: Socially  Isolated (11/08/2023)   Received from [facility]   Social Connection and Isolation Panel    In a typical week, how many times do you talk on the phone with family, friends, or neighbors?: More than three times a week    How often do you get together with friends or relatives?: More than three times a week    How often do you attend church or religious services?: Never    Do you belong to any clubs or organizations such as church groups, unions, fraternal or athletic groups, or school groups?: No    How often do you attend meetings of the clubs or organizations you belong to?: Never    Are you married, widowed, divorced, separated, never married, or living with a partner?: Separated  Intimate Partner Violence: Not At Risk (08/04/2024)   Epic    Fear of Current or Ex-Partner: No    Emotionally Abused: No    Physically Abused: No    Sexually Abused: No  Depression (PHQ2-9): Not on file  Alcohol Screen: Not on file  Housing: Low Risk (08/04/2024)   Epic    Unable to Pay for Housing in the Last Year: No    Number of Times Moved in the Last Year: 0    Homeless in the Last Year: No  Utilities: Not At Risk (08/04/2024)   Epic    Threatened with loss of utilities: No  Health Literacy: Adequate Health Literacy (11/08/2023)   Received from [facility]   B1300 Health Literacy    Frequency of need for help with medical instructions: Never     Family History  Problem Relation Age of Onset   Breast cancer Neg Hx     Current Medications[2]   Physical exam:  Vitals:   08/09/24 1606 08/09/24 2057 08/10/24 0338 08/10/24 0813  BP: (!) 122/94 133/76 116/75 124/73  Pulse: (!) 116 100 90 99  Resp: 20 16 16 19   Temp: 98.1 F (36.7 C) 97.9 F (36.6 C) 98 F (36.7 C) 98.4 F (36.9 C)  TempSrc:      SpO2: 94% 95% 95% 100%  Weight:      Height:       Physical Exam Cardiovascular:     Rate and Rhythm: Normal rate and regular rhythm.     Heart sounds:  Normal heart sounds.  Pulmonary:     Effort: Pulmonary effort is normal.     Breath sounds: Normal breath sounds.  Abdominal:     General: Bowel sounds are normal. There is distension.     Palpations: Abdomen is soft.     Tenderness: There is no abdominal tenderness.  Comments: No palpable hepatosplenomegaly  Lymphadenopathy:     Comments: No palpable cervical, supraclavicular, axillary or inguinal adenopathy    Skin:    General: Skin is warm and dry.  Neurological:     Mental Status: She is alert and oriented to person, place, and time.           Latest Ref Rng & Units 08/10/2024    6:08 AM  CMP  Glucose 70 - 99 mg/dL 894   BUN 6 - 20 mg/dL 7   Creatinine 9.55 - 8.99 mg/dL 9.47   Sodium 864 - 854 mmol/L 140   Potassium 3.5 - 5.1 mmol/L 3.8   Chloride 98 - 111 mmol/L 101   CO2 22 - 32 mmol/L 32   Calcium  8.9 - 10.3 mg/dL 8.5       Latest Ref Rng & Units 08/10/2024    6:08 AM  CBC  WBC 4.0 - 10.5 K/uL 17.9   Hemoglobin 12.0 - 15.0 g/dL 86.5   Hematocrit 63.9 - 46.0 % 41.7   Platelets 150 - 400 K/uL 205     @IMAGES @  DG Chest Port 1 View Result Date: 08/09/2024 EXAM: 1 VIEW(S) XRAY OF THE CHEST 08/09/2024 04:47:00 PM COMPARISON: None available. CLINICAL HISTORY: Fever. FINDINGS: LUNGS AND PLEURA: Calm inspiration. Linear infiltrate or atelectasis in the left lung base. This could represent compressive atelectasis or focal pneumonia. The right lung is clear. No pleural effusion. No pneumothorax. HEART AND MEDIASTINUM: Heart size and pulmonary vascularity are normal. Mediastinal contours appear intact. BONES AND SOFT TISSUES: Postoperative changes in the cervical spine. IMPRESSION: 1. Linear opacity at the left lung base, which may represent atelectasis or focal pneumonia. 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 Noted to have new onset ascites. Request for diagnostic and therapeutic paracentesis. EXAM: ULTRASOUND GUIDED DIAGNOSTIC AND THERAPEUTIC, LEFT-SIDED PARACENTESIS MEDICATIONS: 1% lidocaine , 10 mL. COMPLICATIONS: None immediate. PROCEDURE: Informed written consent was obtained from the patient after a discussion of the risks, benefits and alternatives to treatment. A timeout was performed prior to the initiation of the procedure. Initial ultrasound scanning demonstrates a large amount of ascites within the left lower abdominal quadrant. The left lower abdomen was prepped and draped in the usual sterile fashion. 1% lidocaine  was used for local anesthesia. Following this, a 19 gauge, 10-cm, Yueh catheter was introduced. An ultrasound image was saved for documentation purposes. The paracentesis was performed. The catheter was removed and a dressing was applied. The patient tolerated the procedure well without immediate post procedural complication. FINDINGS: A total of approximately 1.8 L of amber fluid was removed. Samples were sent to the laboratory as requested by the clinical team. IMPRESSION: Successful ultrasound-guided paracentesis yielding 1.8 liters of peritoneal fluid. Procedure performed by: Sherrilee Bal, PA-C under the supervision of Dr. KANDICE Banner Electronically Signed   By: Cordella Banner   On: 08/07/2024 13:37   US  ASCITES (ABDOMEN LIMITED) Result Date: 08/04/2024 CLINICAL DATA:  Ascites. EXAM: LIMITED ABDOMEN ULTRASOUND FOR ASCITES TECHNIQUE: Limited ultrasound survey for ascites was performed in all four abdominal quadrants. COMPARISON:  CT scan from earlier same day FINDINGS: Moderate volume ascites in all 4 quadrants. IMPRESSION: Positive for ascites. Electronically Signed   By: Camellia Candle M.D.   On: 08/04/2024 08:35  CT ABDOMEN PELVIS W CONTRAST Result Date: 08/04/2024 EXAM:  CT ABDOMEN AND PELVIS WITH CONTRAST 08/04/2024 02:14:44 AM TECHNIQUE: CT of the abdomen and pelvis was performed with the administration of 100 mL of iohexol  (OMNIPAQUE ) 300 MG/ML solution. Multiplanar reformatted images are provided for review. Automated exposure control, iterative reconstruction, and/or weight-based adjustment of the mA/kV was utilized to reduce the radiation dose to as low as reasonably achievable. COMPARISON: 10/24/2013. CLINICAL HISTORY: upper abd pain, diarrhea, HR 120, WBC 21 FINDINGS: LOWER CHEST: Lung bases are free from acute infiltrate or sizable effusion. LIVER: The liver is within normal limits. GALLBLADDER AND BILE DUCTS: The gallbladder has been surgically removed. No biliary ductal dilatation. SPLEEN: The spleen is within normal limits. PANCREAS: The pancreas is within normal limits. ADRENAL GLANDS: The adrenal glands are unremarkable. KIDNEYS, URETERS AND BLADDER: The kidneys are unremarkable. No stones in the kidneys or ureters. No hydronephrosis. The bladder is decompressed. GI AND BOWEL: Thickening of the distal esophagus is noted, likely related to reflux. This is stable from the prior exam. The stomach is unremarkable. Distal small bowel shows some wall thickening .this is similar to that seen on the prior exam from 2015 and may simply be reactive to the underlying ascites. Possibility of underlying inflammatory bowel disease deserves consideration. No fistulization is seen. No obstructive or inflammatory changes of the colon are seen. The appendix is within normal limits. PERITONEUM AND RETROPERITONEUM: Diffuse moderate ascites is noted throughout the abdomen. No free air. VASCULATURE: Aorta is normal in caliber. LYMPH NODES: No lymphadenopathy. REPRODUCTIVE ORGANS: The uterus is within normal limits. No definitive ovarian lesion is seen. BONES AND SOFT TISSUES: No acute osseous abnormality. No focal soft tissue abnormality. IMPRESSION: 1. Diffuse moderate ascites. This is  stable from the prior CT examination but increased when compared with the prior ultrasound from 2021. 2. Distal small bowel wall thickening, similar to prior exam, possibly reactive to ascites; underlying inflammatory bowel disease could be considered. No fistulization. The overall appearance is stable from the prior CT examination. Electronically signed by: Oneil Devonshire MD 08/04/2024 02:26 AM EST RP Workstation: [id number]    Assessment and plan- Patient is a 58 y.o. 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  GI on board. No other evidence of systemic involvement given trace urine protein with normal creatnine, LFT's ,CXR and pro BNP.   At this time  the broad differential is infectious/parasitic (secondary/reactive)cause of eosinophilic ascites which is causing peripheral blood eosinophilia versus primary (Clonal) hematologic disorder causing eosinophilia such as myeloproliferative  neoplasms, chronic eosinophilic leukemia, T-cell or B-cell lymphomas, lymphocyte variant hypereosinophilic syndromes. IgE is elevated which can be seen with both primary and secondary causes.   Her initial hematologic workup including BCR-ABL FISH testing, peripheral flow cytometry, T-cell gene rearrangement assay, serum tryptase level, peripheral blood NGS testing has been ordered.  Clinically this still appears to be a secondary/reactive cause.  CT abdomen did not show any evidence of splenomegaly or adenopathy which we would typically see with lymphoma.  However I will proceed with a bone marrow biopsy on 12/22, 2025 if possible by IR. No empiric treatment can be recommended at this time from hematology standpoint. Will continue to follow    Visit Diagnosis 1. Generalized abdominal pain   2. Other ascites   3. Enteritis      Dr.  Annah Skene, MD, MPH [facility] at [facility] [phone] 08/10/2024                   [1]  Allergies Allergen Reactions   Cortisone Other (See Comments) and Swelling    Patient experienced severe stomach pains and bloating; 05/24/22: pt has had an injection recently without issues  Liver failure  Liver failure - subacute & reversible; diagnosed after she had severe bloating & stomach pain after numerous cortisone injections over a 6 month timeframe. Has had isolated cortisone injections which were better spaced out without these difficulties & has tolerated oral steroids without difficulty in short courses as well. This reaction only occurred after the multiple injections within that 6 months.   Liver failure - subacute & reversible; diagnosed after she had severe bloating & stomach pain after numerous cortisone injections over a 6 month timeframe. Has had isolated cortisone injections which were better spaced out without these difficulties & has tolerated oral steroids without difficulty in short courses as well. This reaction only occurred after the multiple injections within that 6 months.  [2]  Current Facility-Administered Medications:    acetaminophen  (TYLENOL ) tablet 650 mg, 650 mg, Oral, Q4H PRN, Jinny, Darren, MD   cefTRIAXone  (ROCEPHIN ) 2 g in sodium chloride  0.9 % 100 mL IVPB, 2 g, Intravenous, Q24H, Wieting, Richard, MD, Last Rate: 200 mL/hr at 08/09/24 1824, 2 g at 08/09/24 1824   DULoxetine  (CYMBALTA ) DR capsule 60 mg, 60 mg, Oral, Daily, Jinny, Darren, MD, 60 mg at 08/10/24 9158   enoxaparin  (LOVENOX ) injection 52.5 mg, 0.5 mg/kg, Subcutaneous, Q24H, Jinny, Darren, MD, 52.5 mg at 08/09/24 2213   furosemide  (LASIX ) tablet 40 mg, 40 mg, Oral, Daily, Josette, Richard, MD, 40 mg at 08/10/24 9157   insulin  aspart (novoLOG ) injection 0-9 Units, 0-9 Units, Subcutaneous, Q4H, Wohl, Darren, MD, 2 Units at 08/09/24 1700   loperamide  (IMODIUM ) capsule 2  mg, 2 mg, Oral, Q8H PRN, Josette Ade, MD, 2 mg at 08/07/24 1227   morphine  (PF) 2 MG/ML injection 1 mg, 1 mg, Intravenous, Q4H PRN, Jinny Carmine, MD, 1 mg at 08/09/24 1018   ondansetron  (ZOFRAN ) tablet 4 mg, 4 mg, Oral, Q6H PRN **OR** ondansetron  (ZOFRAN ) injection 4 mg, 4 mg, Intravenous, Q6H PRN, Jinny Carmine, MD, 4 mg at 08/07/24 1227   pantoprazole  (PROTONIX ) EC tablet 40 mg, 40 mg, Oral, Daily, Wieting, Richard, MD, 40 mg at 08/10/24 0843   promethazine (PHENERGAN) 12.5 mg in sodium chloride  0.9 % 50 mL IVPB, 12.5 mg, Intravenous, Q6H PRN, Maree Hue, MD   rosuvastatin  (CRESTOR ) tablet 10 mg, 10 mg, Oral, Daily, Wieting, Richard, MD, 10 mg at 08/10/24 0841   sodium chloride  flush (NS) 0.9 % injection 3 mL, 3 mL, Intravenous, Q12H, Wohl, Darren, MD, 3 mL at 08/10/24 0843   spironolactone  (ALDACTONE ) tablet 50 mg, 50 mg, Oral, Daily, Josette Ade, MD, 50 mg at 08/10/24 0841  "

## 2024-08-10 NOTE — Progress Notes (Signed)
 "  Ruel Kung , MD [address], [address], [place], [place], [address] Phone: [phone] Fax: [phone]   Tonya Mueller is being followed for ascites  Subjective:  No new complaints    Objective: Vital signs in last 24 hours: Vitals:   08/09/24 1606 08/09/24 2057 08/10/24 0338 08/10/24 0813  BP: (!) 122/94 133/76 116/75 124/73  Pulse: (!) 116 100 90 99  Resp: 20 16 16 19   Temp: 98.1 F (36.7 C) 97.9 F (36.6 C) 98 F (36.7 C) 98.4 F (36.9 C)  TempSrc:      SpO2: 94% 95% 95% 100%  Weight:      Height:       Weight change:   Intake/Output Summary (Last 24 hours) at 08/10/2024 1130 Last data filed at 08/10/2024 0900 Gross per 24 hour  Intake 120 ml  Output --  Net 120 ml     Exam: Neuro : Ax 0 x 3 no pain or distress   Lab Results: @LABTEST2 @ Micro Results: Recent Results (from the past 240 hours)  Blood culture (routine x 2)     Status: None   Collection Time: 08/04/24  3:56 AM   Specimen: BLOOD  Result Value Ref Range Status   Specimen Description BLOOD BLOOD LEFT HAND  Final   Special Requests   Final    BOTTLES DRAWN AEROBIC AND ANAEROBIC Blood Culture results may not be optimal due to an inadequate volume of blood received in culture bottles   Culture   Final    NO GROWTH 5 DAYS Performed at [facility], [address] [place], [place] [address]    Report Status 08/09/2024 FINAL  Final  Blood culture (routine x 2)     Status: None   Collection Time: 08/04/24  4:06 AM   Specimen: BLOOD  Result Value Ref Range Status   Specimen Description BLOOD BLOOD RIGHT HAND  Final   Special Requests   Final    BOTTLES DRAWN AEROBIC AND ANAEROBIC Blood Culture results may not be optimal due to an inadequate volume of blood received in culture bottles   Culture   Final    NO GROWTH 5 DAYS Performed at [facility], [address] [place], [place] [address]    Report Status 08/09/2024 FINAL  Final  C Difficile Quick Screen w  PCR reflex     Status: None   Collection Time: 08/04/24  4:36 AM   Specimen: STOOL  Result Value Ref Range Status   C Diff antigen NEGATIVE NEGATIVE Final   C Diff toxin NEGATIVE NEGATIVE Final   C Diff interpretation No C. difficile detected.  Final    Comment: Performed at [facility], [address] [place], [place] [address]  Gastrointestinal Panel by PCR , Stool     Status: None   Collection Time: 08/04/24  4:36 AM   Specimen: STOOL  Result Value Ref Range Status   Campylobacter species NOT DETECTED NOT DETECTED Final   Plesimonas shigelloides NOT DETECTED NOT DETECTED Final   Salmonella species NOT DETECTED NOT DETECTED Final   Yersinia enterocolitica NOT DETECTED NOT DETECTED Final   Vibrio species NOT DETECTED NOT DETECTED Final   Vibrio cholerae NOT DETECTED NOT DETECTED Final   Enteroaggregative E coli (EAEC) NOT DETECTED NOT DETECTED Final   Enteropathogenic E coli (EPEC) NOT DETECTED NOT DETECTED Final   Enterotoxigenic E coli (ETEC) NOT DETECTED NOT DETECTED Final   Shiga like toxin producing E coli (STEC)  NOT DETECTED NOT DETECTED Final   Shigella/Enteroinvasive E coli (EIEC) NOT DETECTED NOT DETECTED Final   Cryptosporidium NOT DETECTED NOT DETECTED Final   Cyclospora cayetanensis NOT DETECTED NOT DETECTED Final   Entamoeba histolytica NOT DETECTED NOT DETECTED Final   Giardia lamblia NOT DETECTED NOT DETECTED Final   Adenovirus F40/41 NOT DETECTED NOT DETECTED Final   Astrovirus NOT DETECTED NOT DETECTED Final   Norovirus GI/GII NOT DETECTED NOT DETECTED Final   Rotavirus A NOT DETECTED NOT DETECTED Final   Sapovirus (I, II, IV, and V) NOT DETECTED NOT DETECTED Final    Comment: Performed at [facility], [address] [place], [place] [address]  Body fluid culture w Gram Stain     Status: None (Preliminary result)   Collection Time: 08/07/24 11:29 AM   Specimen: PATH Cytology Peritoneal fluid  Result Value Ref Range Status   Specimen  Description   Final    PERITONEAL Performed at [facility], [address], [place], [place] [address]    Special Requests   Final    NONE Performed at [facility], [address] [place], [place] [address]    Gram Stain   Final    FEW WBC PRESENT, PREDOMINANTLY PMN NO ORGANISMS SEEN    Culture   Final    NO GROWTH 2 DAYS Performed at [facility], [address], [place], [place] [address]    Report Status PENDING  Incomplete  Acid Fast Smear (AFB)     Status: None   Collection Time: 08/07/24 11:29 AM   Specimen: Peritoneal Dialysis; Peritoneal Fluid  Result Value Ref Range Status   AFB Specimen Processing Concentration  Final   Acid Fast Smear Negative  Final    Comment: (NOTE) Performed At: [facility] [address] [place], [place] [id number] Jennette Shorter MD Ey:[id number]    Source (AFB) PERITONEAL  Final    Comment: Performed at [facility], [address] [place], [place] [address]  Culture, blood (Routine X 2) w Reflex to ID Panel     Status: None (Preliminary result)   Collection Time: 08/09/24  5:22 PM   Specimen: BLOOD  Result Value Ref Range Status   Specimen Description BLOOD BLOOD RIGHT ARM  Final   Special Requests   Final    BOTTLES DRAWN AEROBIC AND ANAEROBIC Blood Culture adequate volume   Culture   Final    NO GROWTH < 12 HOURS Performed at [facility], [address] [place], [place] [address]    Report Status PENDING  Incomplete  Culture, blood (Routine X 2) w Reflex to ID Panel     Status: None (Preliminary result)   Collection Time: 08/09/24  5:28 PM   Specimen: BLOOD  Result Value Ref Range Status   Specimen Description BLOOD BLOOD LEFT ARM  Final   Special Requests   Final    BOTTLES DRAWN AEROBIC AND ANAEROBIC Blood Culture adequate volume   Culture   Final    NO GROWTH < 12 HOURS Performed at [facility], [address] [place], [place] [address]    Report Status  PENDING  Incomplete   Studies/Results: DG Chest Port 1 View Result Date: 08/09/2024 EXAM: 1 VIEW(S) XRAY OF THE CHEST 08/09/2024 04:47:00 PM COMPARISON: None available. CLINICAL HISTORY: Fever. FINDINGS: LUNGS AND PLEURA: Calm inspiration. Linear infiltrate or atelectasis in the left lung base. This could represent compressive atelectasis or focal pneumonia. The right lung is clear. No  pleural effusion. No pneumothorax. HEART AND MEDIASTINUM: Heart size and pulmonary vascularity are normal. Mediastinal contours appear intact. BONES AND SOFT TISSUES: Postoperative changes in the cervical spine. IMPRESSION: 1. Linear opacity at the left lung base, which may represent atelectasis or focal pneumonia. Electronically signed by: Elsie Gravely MD 08/09/2024 09:26 PM EST RP Workstation: [id number]   Medications: I have reviewed the patient's current medications. Scheduled Meds:  DULoxetine   60 mg Oral Daily   enoxaparin  (LOVENOX ) injection  0.5 mg/kg Subcutaneous Q24H   furosemide   40 mg Oral Daily   insulin  aspart  0-9 Units Subcutaneous Q4H   pantoprazole   40 mg Oral Daily   rosuvastatin   10 mg Oral Daily   sodium chloride  flush  3 mL Intravenous Q12H   spironolactone   50 mg Oral Daily   Continuous Infusions:  cefTRIAXone  (ROCEPHIN )  IV 2 g (08/09/24 1824)   promethazine (PHENERGAN) injection (IM or IVPB)     PRN Meds:.acetaminophen , loperamide , morphine  injection, ondansetron  **OR** ondansetron  (ZOFRAN ) IV, promethazine (PHENERGAN) injection (IM or IVPB)   Assessment: Principal Problem:   Abdominal ascites Active Problems:   Diarrhea   Stomach upset   Controlled type 2 diabetes mellitus without complication, without long-term current use of insulin  (HCC)   Hyperlipidemia, unspecified   Sleep apnea   Obesity, Class III, BMI 40-49.9 (morbid obesity) (HCC)   Eosinophilia, unspecified   SBP (spontaneous bacterial peritonitis) (HCC)   Hypokalemia  Gustave FORBES Kitty 58 y.o. female present  to the emergency room with upper abdominal pain found to have abdominal ascites with a very elevated neutrophilic countPeripheral eosinophilia and high level of eosinophils in the ascites fluid.  Protein levels in the acetic fluid are still in process no LDH is available AFB is negative. SAAG gradient has not been calculated.  Reviewed evaluation upper endoscopy with biopsy and colonoscopy  with biopsy were negative.  I believe workup for vasculitis is negative I do not see any blood in the urine.  ID has been consulted and not feel this is infectious in etiology.   My recommendations 1.  Proceed as per up-to-date algorithm below check serum ascites albumin gradient repeat the appropriate labs to calculate this check LDH, if this is felt to be exudative and if hematological workup is negative then her only other option would be to perform a peritoneal biopsy.  Would not recommend steroids till we have obtained all the tests that we require as it can affect the results and can be dangerous to start steroids if patient has Strongyloides..  2.  In terms of serologies for infectious diseases they can  negative and would recommend we have checked serologies for Toxocara ,schistosomiasis, ???Angiostrongylus costaricensis .   3.  My other suggestion to discuss with infectious diseases would be would it be worth empirically treating with albendazole  and ivermectin for parasites  due to the prevalence of false negative serologies, he country of travel and subsequently decide upon peritoneal biopsies depending on how things are proceeding.  If peritoneal biopsy is obtained then recommend also sending for TB culture.    I have discussed with Dr Josette and Dr Melanee      LOS: 6 days   Ruel Kung, MD 08/10/2024, 11:30 AM      "

## 2024-08-10 NOTE — Plan of Care (Signed)
" °  Problem: Education: Goal: Ability to describe self-care measures that may prevent or decrease complications (Diabetes Survival Skills Education) will improve Outcome: Progressing Goal: Individualized Educational Video(s) Outcome: Progressing   Problem: Nutritional: Goal: Maintenance of adequate nutrition will improve Outcome: Progressing Goal: Progress toward achieving an optimal weight will improve Outcome: Progressing   Problem: Skin Integrity: Goal: Risk for impaired skin integrity will decrease Outcome: Progressing   Problem: Activity: Goal: Risk for activity intolerance will decrease Outcome: Progressing   Problem: Safety: Goal: Ability to remain free from injury will improve Outcome: Progressing   Problem: Pain Managment: Goal: General experience of comfort will improve and/or be controlled Outcome: Progressing   "

## 2024-08-11 ENCOUNTER — Inpatient Hospital Stay: Admit: 2024-08-11 | Discharge: 2024-08-11 | Disposition: A | Attending: Internal Medicine | Admitting: Internal Medicine

## 2024-08-11 DIAGNOSIS — R0600 Dyspnea, unspecified: Secondary | ICD-10-CM | POA: Diagnosis not present

## 2024-08-11 DIAGNOSIS — K30 Functional dyspepsia: Secondary | ICD-10-CM | POA: Diagnosis not present

## 2024-08-11 DIAGNOSIS — R188 Other ascites: Secondary | ICD-10-CM | POA: Diagnosis not present

## 2024-08-11 DIAGNOSIS — R197 Diarrhea, unspecified: Secondary | ICD-10-CM | POA: Diagnosis not present

## 2024-08-11 DIAGNOSIS — D721 Eosinophilia, unspecified: Secondary | ICD-10-CM | POA: Diagnosis not present

## 2024-08-11 LAB — ECHOCARDIOGRAM COMPLETE
AR max vel: 1.68 cm2
AV Peak grad: 7.1 mmHg
Ao pk vel: 1.33 m/s
Area-P 1/2: 4.93 cm2
Height: 61 in
S' Lateral: 2.5 cm
Weight: 3664 [oz_av]

## 2024-08-11 LAB — GLUCOSE, CAPILLARY
Glucose-Capillary: 120 mg/dL — ABNORMAL HIGH (ref 70–99)
Glucose-Capillary: 135 mg/dL — ABNORMAL HIGH (ref 70–99)
Glucose-Capillary: 125 mg/dL — ABNORMAL HIGH (ref 70–99)
Glucose-Capillary: 106 mg/dL — ABNORMAL HIGH (ref 70–99)
Glucose-Capillary: 115 mg/dL — ABNORMAL HIGH (ref 70–99)

## 2024-08-11 LAB — MISC LABCORP TEST (SEND OUT): Labcorp test code: 164798

## 2024-08-11 LAB — LACTATE DEHYDROGENASE: LDH: 257 U/L — ABNORMAL HIGH (ref 105–235)

## 2024-08-11 MED ORDER — ALBENDAZOLE 200 MG PO TABS
400.0000 mg | ORAL_TABLET | Freq: Two times a day (BID) | ORAL | Status: DC
  Administered 2024-08-11 – 2024-08-15 (×9): 400 mg via ORAL
  Filled 2024-08-11 (×10): qty 2

## 2024-08-11 NOTE — Progress Notes (Signed)
" °  Echocardiogram 2D Echocardiogram has been performed.  Tonya Mueller Louder 08/11/2024, 1:54 PM "

## 2024-08-11 NOTE — Plan of Care (Signed)

## 2024-08-11 NOTE — Progress Notes (Signed)
 " Progress Note   Patient: Tonya Mueller FMW:[id number] DOB: 1965-12-01 DOA: 08/04/2024     7 DOS: the patient was seen and examined on 08/11/2024   Brief hospital course: 58 y.o. female with medical history significant of diabetes mellitus 2 on Ozempic, GERD, NASH, recent diagnosis of sleep apnea now on CPAP, obesity with BMI 43, dyslipidemia, cervical radiculopathy/cervical spinal stenosis with history of prior injection therapies to treat pain.  Patient presented to the ED complaining of 3 weeks of upper abdominal pain with diarrhea worse after eating    12/15: GI consult 12/16: EGD and colonoscopy appearance is negative but biopsies were taken, added Phenergan for refractory nausea and vomiting, try clear liquid diet 12/17.  Advance to solid food.  Discontinue IV fluids.  Paracentesis showing eosinophils.  Started on empiric antibiotics. 12/18.  CBC with differential showing eosinophilia, sed rate of 4 12/19.  ANA negative, ANCA negative, Strongyloides, IgE negative.  Consulted oncology.  Oncology ordered BCR-ABL, flow cytometry, myeloid NGS, pro BNP, T-cell panel and tryptase. 12/20.  Patient feels a little bit better today.  Still has abdominal distention.  Still has some diarrhea.  Eating a little bit better. 12/21.  Patient wants to start treatment empirically for parasite.  Case discussed with infectious disease specialist and we will prescribe albendazole  400 mg twice daily for 7 days.  Assessment and Plan: * Abdominal ascites Patient interested in starting empiric medication for treating parasite.  Will start albendazole  400 mg twice daily.  Case discussed with infectious disease specialist.  High eosinophil count in abdominal fluid.  On empiric Rocephin  for suspected SBP blood culture negative.  Increased Aldactone  and low-dose Lasix .  Sedimentation rate normal.  CRP low.  ANA and ANCA are negative..  IgG Strongyloides antibody negative. Cytology negative. Oncology sent off testing which  is still pending.  Will repeat another paracentesis on Monday.  Eosinophilia, unspecified Eosinophils seen in ascitic fluid and on CBC with differential.  Undergoing workup.  On empiric antibiotic.  Started empiric albendazole  for empiric treatment for possible #.  Consulted oncology and she ordered a BCR-ABL, flow cytometry, myeloid NGS, proBNP, T-cell panel and tryptase.  Cytology of the ascites fluid negative.  Stomach upset EGD negative.  Biopsy consistent with chronic peptic duodenitis.  Continue Protonix .  Diarrhea Stool studies negative.  Colonoscopy appeared normal.  Biopsies taken.  As needed Imodium .  Send off ova and parasites.  Giardia came back negative.  Controlled type 2 diabetes mellitus without complication, without long-term current use of insulin  (HCC) Hold Ozempic  Hyperlipidemia, unspecified Restarting statin  Sleep apnea On CPAP  Obesity, Class III, BMI 40-49.9 (morbid obesity) (HCC) Continue to monitor with starting diuresis.  BMI 43.27  Hypokalemia Increased Aldactone .        Subjective: Patient starting to feel little bit better.  Eating a little bit more.  Still having diarrhea.  Still having abdominal cramping  Physical Exam: Vitals:   08/10/24 2024 08/10/24 2336 08/11/24 0417 08/11/24 0742  BP: 139/65 133/72 119/84 130/89  Pulse: (!) 45 99 88 (!) 103  Resp: 18 18 17 16   Temp: 98.7 F (37.1 C) 98.5 F (36.9 C) 98.2 F (36.8 C) 97.9 F (36.6 C)  TempSrc:      SpO2: 93% 94% 95% 98%  Weight:      Height:       Physical Exam HENT:     Head: Normocephalic.  Eyes:     General: Lids are normal.     Conjunctiva/sclera: Conjunctivae normal.  Cardiovascular:     Rate and Rhythm: Normal rate and regular rhythm.     Heart sounds: Normal heart sounds, S1 normal and S2 normal.  Pulmonary:     Breath sounds: No decreased breath sounds, wheezing, rhonchi or rales.  Abdominal:     General: There is distension.     Palpations: Abdomen is soft.      Tenderness: There is generalized abdominal tenderness.  Musculoskeletal:     Right lower leg: Swelling present.     Left lower leg: Swelling present.  Skin:    General: Skin is warm.     Findings: No rash.  Neurological:     Mental Status: She is alert and oriented to person, place, and time.     Data Reviewed: LDH 257  Disposition: Status is: Inpatient Remains inpatient appropriate because: Patient interested in starting empiric treatment for parasite.  Will start albendazole  400 mg twice daily for 7 days.  Planned Discharge Destination: Home    Time spent: 28 minutes  Author: Charlie Patterson, MD 08/11/2024 12:21 PM  For on call review [email or web address].  "

## 2024-08-11 NOTE — Plan of Care (Signed)
" °  Problem: Education: Goal: Ability to describe self-care measures that may prevent or decrease complications (Diabetes Survival Skills Education) will improve Outcome: Progressing Goal: Individualized Educational Video(s) Outcome: Progressing   Problem: Nutritional: Goal: Maintenance of adequate nutrition will improve Outcome: Progressing Goal: Progress toward achieving an optimal weight will improve Outcome: Progressing   Problem: Clinical Measurements: Goal: Will remain free from infection Outcome: Progressing   Problem: Activity: Goal: Risk for activity intolerance will decrease Outcome: Progressing   Problem: Coping: Goal: Level of anxiety will decrease Outcome: Progressing   Problem: Pain Managment: Goal: General experience of comfort will improve and/or be controlled Outcome: Progressing   Problem: Safety: Goal: Ability to remain free from injury will improve Outcome: Progressing   "

## 2024-08-12 ENCOUNTER — Inpatient Hospital Stay

## 2024-08-12 ENCOUNTER — Telehealth (HOSPITAL_COMMUNITY): Payer: Self-pay

## 2024-08-12 ENCOUNTER — Other Ambulatory Visit (HOSPITAL_COMMUNITY): Payer: Self-pay

## 2024-08-12 DIAGNOSIS — G4730 Sleep apnea, unspecified: Secondary | ICD-10-CM | POA: Diagnosis not present

## 2024-08-12 DIAGNOSIS — G4739 Other sleep apnea: Secondary | ICD-10-CM | POA: Diagnosis not present

## 2024-08-12 DIAGNOSIS — E66813 Obesity, class 3: Secondary | ICD-10-CM | POA: Diagnosis not present

## 2024-08-12 DIAGNOSIS — K30 Functional dyspepsia: Secondary | ICD-10-CM | POA: Diagnosis not present

## 2024-08-12 DIAGNOSIS — E876 Hypokalemia: Secondary | ICD-10-CM | POA: Diagnosis not present

## 2024-08-12 DIAGNOSIS — E785 Hyperlipidemia, unspecified: Secondary | ICD-10-CM | POA: Diagnosis not present

## 2024-08-12 DIAGNOSIS — D7219 Other eosinophilia: Secondary | ICD-10-CM | POA: Diagnosis not present

## 2024-08-12 DIAGNOSIS — E119 Type 2 diabetes mellitus without complications: Secondary | ICD-10-CM | POA: Diagnosis not present

## 2024-08-12 DIAGNOSIS — R197 Diarrhea, unspecified: Secondary | ICD-10-CM | POA: Diagnosis not present

## 2024-08-12 DIAGNOSIS — R188 Other ascites: Secondary | ICD-10-CM | POA: Diagnosis not present

## 2024-08-12 DIAGNOSIS — D721 Eosinophilia, unspecified: Secondary | ICD-10-CM | POA: Diagnosis not present

## 2024-08-12 LAB — CBC WITH DIFFERENTIAL/PLATELET
Abs Immature Granulocytes: 0.03 K/uL (ref 0.00–0.07)
Basophils Absolute: 0.1 K/uL (ref 0.0–0.1)
Basophils Relative: 0 %
Eosinophils Absolute: 10 K/uL — ABNORMAL HIGH (ref 0.0–0.5)
Eosinophils Relative: 62 %
HCT: 40.9 % (ref 36.0–46.0)
Hemoglobin: 13.4 g/dL (ref 12.0–15.0)
Immature Granulocytes: 0 %
Lymphocytes Relative: 13 %
Lymphs Abs: 2.2 K/uL (ref 0.7–4.0)
MCH: 28.5 pg (ref 26.0–34.0)
MCHC: 32.8 g/dL (ref 30.0–36.0)
MCV: 86.8 fL (ref 80.0–100.0)
Monocytes Absolute: 0.3 K/uL (ref 0.1–1.0)
Monocytes Relative: 2 %
Neutro Abs: 3.8 K/uL (ref 1.7–7.7)
Neutrophils Relative %: 23 %
Platelets: 198 K/uL (ref 150–400)
RBC: 4.71 MIL/uL (ref 3.87–5.11)
RDW: 13.5 % (ref 11.5–15.5)
Smear Review: NORMAL
WBC: 16.4 K/uL — ABNORMAL HIGH (ref 4.0–10.5)
nRBC: 0 % (ref 0.0–0.2)

## 2024-08-12 LAB — PATHOLOGIST SMEAR REVIEW

## 2024-08-12 LAB — BODY FLUID CELL COUNT WITH DIFFERENTIAL
Eos, Fluid: 96 %
Lymphs, Fluid: 1 %
Monocyte-Macrophage-Serous Fluid: 3 %
Neutrophil Count, Fluid: 0 %
Total Nucleated Cell Count, Fluid: 13722 uL

## 2024-08-12 LAB — BASIC METABOLIC PANEL WITH GFR
Anion gap: 11 (ref 5–15)
BUN: 8 mg/dL (ref 6–20)
CO2: 29 mmol/L (ref 22–32)
Calcium: 8.3 mg/dL — ABNORMAL LOW (ref 8.9–10.3)
Chloride: 100 mmol/L (ref 98–111)
Creatinine, Ser: 0.45 mg/dL (ref 0.44–1.00)
GFR, Estimated: >60 mL/min
Glucose, Bld: 104 mg/dL — ABNORMAL HIGH (ref 70–99)
Potassium: 3.3 mmol/L — ABNORMAL LOW (ref 3.5–5.1)
Sodium: 140 mmol/L (ref 135–145)

## 2024-08-12 LAB — GLUCOSE, CAPILLARY
Glucose-Capillary: 102 mg/dL — ABNORMAL HIGH (ref 70–99)
Glucose-Capillary: 110 mg/dL — ABNORMAL HIGH (ref 70–99)
Glucose-Capillary: 111 mg/dL — ABNORMAL HIGH (ref 70–99)
Glucose-Capillary: 112 mg/dL — ABNORMAL HIGH (ref 70–99)
Glucose-Capillary: 118 mg/dL — ABNORMAL HIGH (ref 70–99)
Glucose-Capillary: 145 mg/dL — ABNORMAL HIGH (ref 70–99)

## 2024-08-12 LAB — TRYPTASE: Tryptase: 7.3 ug/L (ref 2.2–13.2)

## 2024-08-12 MED ORDER — LIDOCAINE HCL (PF) 1 % IJ SOLN
10.0000 mL | Freq: Once | INTRAMUSCULAR | Status: AC
  Administered 2024-08-12: 10 mL via INTRADERMAL
  Filled 2024-08-12: qty 10

## 2024-08-12 MED ORDER — SPIRONOLACTONE 25 MG PO TABS
100.0000 mg | ORAL_TABLET | Freq: Every day | ORAL | Status: DC
  Administered 2024-08-13 – 2024-08-15 (×3): 100 mg via ORAL
  Filled 2024-08-12 (×3): qty 4

## 2024-08-12 MED ORDER — INSULIN ASPART 100 UNIT/ML IJ SOLN
0.0000 [IU] | Freq: Three times a day (TID) | INTRAMUSCULAR | Status: DC
  Administered 2024-08-14: 1 [IU] via SUBCUTANEOUS
  Filled 2024-08-12: qty 1

## 2024-08-12 MED ORDER — POTASSIUM CHLORIDE CRYS ER 20 MEQ PO TBCR
40.0000 meq | EXTENDED_RELEASE_TABLET | Freq: Once | ORAL | Status: AC
  Administered 2024-08-12: 40 meq via ORAL
  Filled 2024-08-12: qty 2

## 2024-08-12 MED ORDER — INSULIN ASPART 100 UNIT/ML IJ SOLN: 0.0000 [IU] | Freq: Every day | INTRAMUSCULAR | Status: DC

## 2024-08-12 NOTE — Progress Notes (Signed)
 INFECTIOUS DISEASE PROGRESS NOTE Date of Admission:  08/04/2024     ID: Tonya Mueller is a 58 y.o. 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  Principal Problem:   Abdominal ascites Active Problems:   Diarrhea   Stomach upset   Controlled type 2 diabetes mellitus without complication, without long-term current use of insulin  (HCC)   Hyperlipidemia, unspecified   Sleep apnea   Obesity, Class III, BMI 40-49.9 (morbid obesity) (HCC)   Eosinophilia, unspecified   SBP (spontaneous bacterial peritonitis) (HCC)   Hypokalemia   Subjective: No fever, repeat para done today with 1.8 L   ROS  Eleven systems are reviewed and negative except per hpi  Medications:  Antibiotics Given (last 72 hours)     Date/Time Action Medication Dose Rate   08/09/24 1824 New Bag/Given   cefTRIAXone  (ROCEPHIN ) 2 g in sodium chloride  0.9 % 100 mL IVPB 2 g 200 mL/hr   08/10/24 1805 New Bag/Given   cefTRIAXone  (ROCEPHIN ) 2 g in sodium chloride  0.9 % 100 mL IVPB 2 g 200 mL/hr   08/11/24 9071 Given   albendazole  (ALBENZA ) tablet 400 mg 400 mg    08/11/24 1747 New Bag/Given   cefTRIAXone  (ROCEPHIN ) 2 g in sodium chloride  0.9 % 100 mL IVPB 2 g 200 mL/hr   08/11/24 1747 Given   albendazole  (ALBENZA ) tablet 400 mg 400 mg    08/12/24 0900 Given   albendazole  (ALBENZA ) tablet 400 mg 400 mg        albendazole   400 mg Oral BID WC   DULoxetine   60 mg Oral Daily   enoxaparin  (LOVENOX ) injection  0.5 mg/kg Subcutaneous Q24H   furosemide   40 mg Oral Daily   insulin  aspart  0-9 Units Subcutaneous Q4H   pantoprazole   40 mg Oral Daily   rosuvastatin   10 mg Oral Daily   sodium chloride  flush  3 mL Intravenous Q12H   [START ON 08/13/2024] spironolactone   100 mg Oral Daily    Objective: Vital signs in last 24 hours: Temp:  [98 F (36.7 C)-99 F (37.2 C)] 99 F (37.2 C) (12/22 0734) Pulse Rate:  [92-110] 107 (12/22 1323) Resp:  [16-18] 16 (12/22 0734) BP: (113-131)/(61-85) 113/62 (12/22  1323) SpO2:  [94 %-96 %] 96 % (12/22 1323) Physical Exam  Constitutional:  oriented to person, place, and time. appears well-developed and well-nourished. No distress.  HENT: [place]/AT, PERRLA, no scleral icterus Mouth/Throat: Oropharynx is clear and moist. No oropharyngeal exudate.  Cardiovascular: Normal rate, regular rhythm and normal heart sounds. Exam reveals no gallop and no friction rub.  No murmur heard.  Pulmonary/Chest: Effort normal and breath sounds normal. No respiratory distress.  has no wheezes.  Neck = supple, no nuchal rigidity Abdominal: Soft. Abd distention. Non tender Lymphadenopathy: no cervical adenopathy. No axillary adenopathy Neurological: alert and oriented to person, place, and time.  Skin: Skin is warm and dry. No rash noted. No erythema.  Psychiatric: a normal mood and affect.  behavior is normal.    Lab Results Recent Labs    08/10/24 0608 08/12/24 0602  WBC 17.9* 16.4*  HGB 13.4 13.4  HCT 41.7 40.9  NA 140 140  K 3.8 3.3*  CL 101 100  CO2 32 29  BUN 7 8  CREATININE 0.52 0.45    Microbiology: Results for orders placed or performed during the hospital encounter of 08/04/24  Blood culture (routine x 2)     Status: None   Collection Time: 08/04/24  3:56 AM   Specimen:  BLOOD  Result Value Ref Range Status   Specimen Description BLOOD BLOOD LEFT HAND  Final   Special Requests   Final    BOTTLES DRAWN AEROBIC AND ANAEROBIC Blood Culture results may not be optimal due to an inadequate volume of blood received in culture bottles   Culture   Final    NO GROWTH 5 DAYS Performed at [facility], [address] [place], [place] [address]    Report Status 08/09/2024 FINAL  Final  Blood culture (routine x 2)     Status: None   Collection Time: 08/04/24  4:06 AM   Specimen: BLOOD  Result Value Ref Range Status   Specimen Description BLOOD BLOOD RIGHT HAND  Final   Special Requests   Final    BOTTLES DRAWN AEROBIC AND ANAEROBIC Blood Culture  results may not be optimal due to an inadequate volume of blood received in culture bottles   Culture   Final    NO GROWTH 5 DAYS Performed at [facility], [address] [place], [place] [address]    Report Status 08/09/2024 FINAL  Final  C Difficile Quick Screen w PCR reflex     Status: None   Collection Time: 08/04/24  4:36 AM   Specimen: STOOL  Result Value Ref Range Status   C Diff antigen NEGATIVE NEGATIVE Final   C Diff toxin NEGATIVE NEGATIVE Final   C Diff interpretation No C. difficile detected.  Final    Comment: Performed at [facility], [address] [place], [place] [address]  Gastrointestinal Panel by PCR , Stool     Status: None   Collection Time: 08/04/24  4:36 AM   Specimen: STOOL  Result Value Ref Range Status   Campylobacter species NOT DETECTED NOT DETECTED Final   Plesimonas shigelloides NOT DETECTED NOT DETECTED Final   Salmonella species NOT DETECTED NOT DETECTED Final   Yersinia enterocolitica NOT DETECTED NOT DETECTED Final   Vibrio species NOT DETECTED NOT DETECTED Final   Vibrio cholerae NOT DETECTED NOT DETECTED Final   Enteroaggregative E coli (EAEC) NOT DETECTED NOT DETECTED Final   Enteropathogenic E coli (EPEC) NOT DETECTED NOT DETECTED Final   Enterotoxigenic E coli (ETEC) NOT DETECTED NOT DETECTED Final   Shiga like toxin producing E coli (STEC) NOT DETECTED NOT DETECTED Final   Shigella/Enteroinvasive E coli (EIEC) NOT DETECTED NOT DETECTED Final   Cryptosporidium NOT DETECTED NOT DETECTED Final   Cyclospora cayetanensis NOT DETECTED NOT DETECTED Final   Entamoeba histolytica NOT DETECTED NOT DETECTED Final   Giardia lamblia NOT DETECTED NOT DETECTED Final   Adenovirus F40/41 NOT DETECTED NOT DETECTED Final   Astrovirus NOT DETECTED NOT DETECTED Final   Norovirus GI/GII NOT DETECTED NOT DETECTED Final   Rotavirus A NOT DETECTED NOT DETECTED Final   Sapovirus (I, II, IV, and V) NOT DETECTED NOT DETECTED Final     Comment: Performed at [facility], [address] [place], [place] [address]  Body fluid culture w Gram Stain     Status: None   Collection Time: 08/07/24 11:29 AM   Specimen: PATH Cytology Peritoneal fluid  Result Value Ref Range Status   Specimen Description   Final    PERITONEAL Performed at [facility], [address] [place], [place] [address]    Special Requests   Final    NONE Performed at [facility], [address] [place], [place] [address]    Gram Stain   Final  FEW WBC PRESENT, PREDOMINANTLY PMN NO ORGANISMS SEEN    Culture   Final    NO GROWTH 3 DAYS Performed at [facility], [address] [place], [place] [address]    Report Status 08/10/2024 FINAL  Final  Acid Fast Smear (AFB)     Status: None   Collection Time: 08/07/24 11:29 AM   Specimen: Peritoneal Dialysis; Peritoneal Fluid  Result Value Ref Range Status   AFB Specimen Processing Concentration  Final   Acid Fast Smear Negative  Final    Comment: (NOTE) Performed At: [facility] [address] [place], [place] [id number] Jennette Shorter MD Ey:[id number]    Source (AFB) PERITONEAL  Final    Comment: Performed at [facility], [address] [place], [place] [address]  Giardia, EIA; Ova/Parasite     Status: None (Preliminary result)   Collection Time: 08/08/24  9:47 AM   Specimen: Stool  Result Value Ref Range Status   Ova + Parasite Exam PENDING  Incomplete   Giardia Ag, Stl Negative Negative Final    Comment: (NOTE) Performed At: [facility] [address] [place], [place] [id number] Jennette Shorter MD Ey:[id number]   Culture, blood (Routine X 2) w Reflex to ID Panel     Status: None (Preliminary result)   Collection Time: 08/09/24  5:22 PM   Specimen: BLOOD  Result Value Ref Range Status   Specimen Description BLOOD BLOOD RIGHT ARM  Final   Special Requests   Final    BOTTLES DRAWN AEROBIC AND ANAEROBIC Blood Culture  adequate volume   Culture   Final    NO GROWTH 3 DAYS Performed at [facility], [address], [place], [place] [address]    Report Status PENDING  Incomplete  Culture, blood (Routine X 2) w Reflex to ID Panel     Status: None (Preliminary result)   Collection Time: 08/09/24  5:28 PM   Specimen: BLOOD  Result Value Ref Range Status   Specimen Description BLOOD BLOOD LEFT ARM  Final   Special Requests   Final    BOTTLES DRAWN AEROBIC AND ANAEROBIC Blood Culture adequate volume   Culture   Final    NO GROWTH 3 DAYS Performed at [facility], [address] [place], [place] [address]    Report Status PENDING  Incomplete    Studies/Results: US  Paracentesis Result Date: 08/12/2024 INDICATION: The 58 year old female with a history of NASH cirrhosis, admitted for abdominal pain/ diarrhea. Now noted with new onset ascites. IR is requested for repeat diagnostic and therapeutic paracentesis. EXAM: ULTRASOUND GUIDED DIAGNOSTIC AND THERAPEUTIC PARACENTESIS MEDICATIONS: 9 mL of 1% lidocaine . COMPLICATIONS: None immediate. PROCEDURE: Informed written consent was obtained from the patient after a discussion of the risks, benefits and alternatives to treatment. A timeout was performed prior to the initiation of the procedure. Initial ultrasound scanning demonstrates a moderate amount of ascites within the right lower abdominal quadrant. The right lower abdomen was prepped and draped in the usual sterile fashion. 1% lidocaine  was used for local anesthesia. Following this, a 19 gauge, 7-cm, Yueh catheter was introduced. An ultrasound image was saved for documentation purposes. The paracentesis was performed. The catheter was removed and a dressing was applied. The patient tolerated the procedure well without immediate post procedural complication. FINDINGS: A total of approximately 1.8 L of clear, straw-colored peritoneal fluid was removed. Samples were sent to the laboratory as requested  by the clinical team. IMPRESSION: Successful ultrasound-guided paracentesis yielding 1.8 liters of  peritoneal fluid. Procedure performed by Carlin Griffon, PA-C, under the direct supervision of Dr. Marcey Moan, MD Electronically Signed   By: Marcey Moan M.D.   On: 08/12/2024 13:59   ECHOCARDIOGRAM COMPLETE Result Date: 08/11/2024    ECHOCARDIOGRAM REPORT   Patient Name:   Tonya Mueller Date of Exam: 08/11/2024 Medical Rec #:  [id number]      Height:       61.0 in Accession #:    [id number]     Weight:       229.0 lb Date of Birth:  1965-08-30       BSA:          2.001 m Patient Age:    58 years       BP:           130/89 mmHg Patient Gender: F              HR:           92 bpm. Exam Location:  [facility] Procedure: 2D Echo (Both Spectral and Color Flow Doppler were utilized during            procedure). Indications:     Dyspnea R06.00  History:         Patient has no prior history of Echocardiogram examinations.  Sonographer:     Thedora Louder RDCS, FASE Referring Phys:  [id number] CHARLIE PATTERSON Diagnosing Phys: Denyse Bathe IMPRESSIONS  1. Left ventricular ejection fraction, by estimation, is 55 to 60%. The left ventricle has normal function. The left ventricle has no regional wall motion abnormalities. Left ventricular diastolic parameters were normal.  2. Right ventricular systolic function is normal. The right ventricular size is normal.  3. The mitral valve is normal in structure. No evidence of mitral valve regurgitation. No evidence of mitral stenosis.  4. The aortic valve is normal in structure. Aortic valve regurgitation is not visualized. No aortic stenosis is present.  5. The inferior vena cava is normal in size with greater than 50% respiratory variability, suggesting right atrial pressure of 3 mmHg. FINDINGS  Left Ventricle: Left ventricular ejection fraction, by estimation, is 55 to 60%. The left ventricle has normal function. The left ventricle has no regional wall motion abnormalities. Strain was  performed and the global longitudinal strain is indeterminate. The left ventricular internal cavity size was normal in size. There is no left ventricular hypertrophy. Left ventricular diastolic parameters were normal. Right Ventricle: The right ventricular size is normal. No increase in right ventricular wall thickness. Right ventricular systolic function is normal. Left Atrium: Left atrial size was normal in size. Right Atrium: Right atrial size was normal in size. Pericardium: There is no evidence of pericardial effusion. Mitral Valve: The mitral valve is normal in structure. No evidence of mitral valve regurgitation. No evidence of mitral valve stenosis. Tricuspid Valve: The tricuspid valve is normal in structure. Tricuspid valve regurgitation is trivial. No evidence of tricuspid stenosis. Aortic Valve: The aortic valve is normal in structure. Aortic valve regurgitation is not visualized. No aortic stenosis is present. Aortic valve peak gradient measures 7.1 mmHg. Pulmonic Valve: The pulmonic valve was normal in structure. Pulmonic valve regurgitation is trivial. No evidence of pulmonic stenosis. Aorta: The aortic root is normal in size and structure. Venous: The inferior vena cava is normal in size with greater than 50% respiratory variability, suggesting right atrial pressure of 3 mmHg. IAS/Shunts: No atrial level shunt detected by color flow Doppler. Additional Comments: 3D was performed not requiring  image post processing on an independent workstation and was indeterminate.  LEFT VENTRICLE PLAX 2D LVIDd:         3.60 cm   Diastology LVIDs:         2.50 cm   LV e' medial:    8.92 cm/s LV PW:         1.10 cm   LV E/e' medial:  7.2 LV IVS:        1.00 cm   LV e' lateral:   14.60 cm/s LVOT diam:     1.80 cm   LV E/e' lateral: 4.4 LV SV:         38 LV SV Index:   19 LVOT Area:     2.54 cm  RIGHT VENTRICLE RV Basal diam:  2.50 cm RV S prime:     17.80 cm/s TAPSE (M-mode): 2.6 cm LEFT ATRIUM           Index         RIGHT ATRIUM           Index LA diam:      3.60 cm 1.80 cm/m   RA Area:     12.20 cm LA Vol (A2C): 26.3 ml 13.15 ml/m  RA Volume:   29.50 ml  14.75 ml/m LA Vol (A4C): 10.1 ml 5.05 ml/m  AORTIC VALVE                 PULMONIC VALVE AV Area (Vmax): 1.68 cm     PV Vmax:        0.98 m/s AV Vmax:        133.00 cm/s  PV Peak grad:   3.8 mmHg AV Peak Grad:   7.1 mmHg     RVOT Peak grad: 4 mmHg LVOT Vmax:      87.80 cm/s LVOT Vmean:     59.500 cm/s LVOT VTI:       0.149 m  AORTA Ao Root diam: 2.90 cm Ao Asc diam:  2.70 cm MITRAL VALVE MV Area (PHT): 4.93 cm    SHUNTS MV Decel Time: 154 msec    Systemic VTI:  0.15 m MV E velocity: 63.90 cm/s  Systemic Diam: 1.80 cm MV A velocity: 66.00 cm/s MV E/A ratio:  0.97 Shaukat Khan Electronically signed by Denyse Bathe Signature Date/Time: 08/11/2024/3:32:54 PM    Final     Assessment/Plan: Tonya Mueller is a 58 y.o. female with peripheral eosinophilia, abd pain, ascitics with profound eosinophilia. She is from [place]. HIV neg. Strongyloides negative. Onc workup pending   Recommendations Peripheral eosinophila and eosinophilic ascites Pending  coccidiomycosis, stool O and p, histoplasma.  Given prior eosinophilia several years ago and prior CT in 2015 with moderate ascites this is likely not infectious given prolonged nature Would treat with 7 days albendazole  and can fu as otpt.    Tonya Mueller   08/12/2024, 2:26 PM

## 2024-08-12 NOTE — Progress Notes (Signed)
 " Progress Note   Patient: Tonya Mueller FMW:[id number] DOB: [birth year] DOA: 08/04/2024     8 DOS: the patient was seen and examined on 08/12/2024   Brief hospital course: 58 y.o. female with medical history significant of diabetes mellitus 2 on Ozempic, GERD, NASH, recent diagnosis of sleep apnea now on CPAP, obesity with BMI 43, dyslipidemia, cervical radiculopathy/cervical spinal stenosis with history of prior injection therapies to treat pain.  Patient presented to the ED complaining of 3 weeks of upper abdominal pain with diarrhea worse after eating    12/15: GI consult 12/16: EGD and colonoscopy appearance is negative but biopsies were taken, added Phenergan for refractory nausea and vomiting, try clear liquid diet 12/17.  Advance to solid food.  Discontinue IV fluids.  Paracentesis showing eosinophils.  Started on empiric antibiotics. 12/18.  CBC with differential showing eosinophilia, sed rate of 4 12/19.  ANA negative, ANCA negative, Strongyloides, IgE negative.  Consulted oncology.  Oncology ordered BCR-ABL, flow cytometry, myeloid NGS, pro BNP, T-cell panel and tryptase. 12/20.  Patient feels a little bit better today.  Still has abdominal distention.  Still has some diarrhea.  Eating a little bit better. 12/21.  Patient wants to start treatment empirically for parasite.  Case discussed with infectious disease specialist and we will prescribe albendazole  400 mg twice daily for 7 days.  Assessment and Plan: * Abdominal ascites Continue empiric albendazole  400 mg twice daily.  Case discussed with infectious disease specialist over the weekend.  High eosinophil count in abdominal fluid.  On empiric Rocephin  for suspected SBP even though culture negative.  Increased Aldactone  and low-dose Lasix .  Sedimentation rate normal.  CRP low.  ANA and ANCA are negative.  IgG Strongyloides antibody negative. Cytology negative. Oncology sent off testing which is still pending.  Will repeat another  paracentesis today.  Eosinophilia, unspecified Eosinophils seen in ascitic fluid and on CBC with differential.  Undergoing workup.  On empiric antibiotic.  Started empiric albendazole  for empiric treatment for possible parasite.  Consulted oncology and she ordered a BCR-ABL, flow cytometry, myeloid NGS, proBNP, T-cell panel and tryptase.  Cytology of the ascites fluid negative.  Since tryptase is negative, oncology decided to hold off on bone marrow biopsy and follow-up as outpatient after treatment course with albendazole .  Stomach upset EGD negative.  Biopsy consistent with chronic peptic duodenitis.  Continue Protonix .  Diarrhea Stool studies negative.  Colonoscopy appeared normal.  Biopsies taken.  As needed Imodium .  Send off ova and parasites.  Giardia came back negative.  Controlled type 2 diabetes mellitus without complication, without long-term current use of insulin  (HCC) Hold Ozempic  Hyperlipidemia, unspecified On statin  Sleep apnea On CPAP  Obesity, Class III, BMI 40-49.9 (morbid obesity) (HCC) Continue to monitor with starting diuresis.  BMI 43.27  Hypokalemia Replace potassium today and increase Aldactone .        Subjective: Patient feeling little bit better today.  Had upset stomach yesterday after medication.  Physical Exam: Vitals:   08/11/24 1530 08/11/24 1956 08/12/24 0357 08/12/24 0734  BP: 118/61 130/80 127/85 121/67  Pulse: 97 92 100 99  Resp: 16 16 18 16   Temp: 99 F (37.2 C) 98 F (36.7 C) 98.4 F (36.9 C) 99 F (37.2 C)  TempSrc:      SpO2: 95% 94% 95% 95%  Weight:      Height:       Physical Exam HENT:     Head: Normocephalic.  Eyes:     General: Lids  are normal.     Conjunctiva/sclera: Conjunctivae normal.  Cardiovascular:     Rate and Rhythm: Normal rate and regular rhythm.     Heart sounds: Normal heart sounds, S1 normal and S2 normal.  Pulmonary:     Breath sounds: No decreased breath sounds, wheezing, rhonchi or rales.   Abdominal:     General: There is distension.     Palpations: Abdomen is soft.     Tenderness: There is generalized abdominal tenderness.  Musculoskeletal:     Right lower leg: Swelling present.     Left lower leg: Swelling present.  Skin:    General: Skin is warm.     Findings: No rash.  Neurological:     Mental Status: She is alert and oriented to person, place, and time.     Data Reviewed: Potassium 3.3, creatinine 0.45, white blood cell count 16.4, hemoglobin 13.4, platelet count 198  Disposition: Status is: Inpatient Remains inpatient appropriate because: Repeat paracentesis today.  Planned Discharge Destination: Home    Time spent: 28 minutes  Author: Charlie Patterson, MD 08/12/2024 12:27 PM  For on call review [email or web address].  "

## 2024-08-12 NOTE — Plan of Care (Signed)
" °  Problem: Education: Goal: Ability to describe self-care measures that may prevent or decrease complications (Diabetes Survival Skills Education) will improve Outcome: Progressing Goal: Individualized Educational Video(s) Outcome: Progressing   Problem: Coping: Goal: Ability to adjust to condition or change in health will improve Outcome: Progressing   Problem: Nutritional: Goal: Maintenance of adequate nutrition will improve Outcome: Progressing Goal: Progress toward achieving an optimal weight will improve Outcome: Progressing   Problem: Skin Integrity: Goal: Risk for impaired skin integrity will decrease Outcome: Progressing   Problem: Education: Goal: Knowledge of General Education information will improve Description: Including pain rating scale, medication(s)/side effects and non-pharmacologic comfort measures Outcome: Progressing   Problem: Clinical Measurements: Goal: Ability to maintain clinical measurements within normal limits will improve Outcome: Progressing Goal: Will remain free from infection Outcome: Progressing Goal: Diagnostic test results will improve Outcome: Progressing Goal: Respiratory complications will improve Outcome: Progressing Goal: Cardiovascular complication will be avoided Outcome: Progressing   Problem: Pain Managment: Goal: General experience of comfort will improve and/or be controlled Outcome: Progressing   Problem: Safety: Goal: Ability to remain free from injury will improve Outcome: Progressing   "

## 2024-08-12 NOTE — Procedures (Signed)
 PROCEDURE SUMMARY:  Successful image-guided paracentesis from the right abdomen.  Yielded 1.80 liters of clear, straw-colored peritoneal fluid.  No immediate complications.  EBL: zero Patient tolerated well.   Specimen was sent for labs.  Please see imaging section of Epic for full dictation.  Carlin LABOR Dexter Signor PA-C 08/12/2024 1:39 PM

## 2024-08-12 NOTE — Telephone Encounter (Signed)
 Pharmacy Patient Advocate Encounter  Insurance verification completed.    The patient is insured through [facility] MEDICAID.     Ran test claim for albendazole  200mg  tablet and the current 5 day co-pay is $4.   This test claim was processed through [employer]- copay amounts may vary at other pharmacies due to [employer], or as the patient moves through the different stages of their insurance plan.

## 2024-08-13 ENCOUNTER — Other Ambulatory Visit: Payer: Self-pay

## 2024-08-13 DIAGNOSIS — R197 Diarrhea, unspecified: Secondary | ICD-10-CM | POA: Diagnosis not present

## 2024-08-13 DIAGNOSIS — K30 Functional dyspepsia: Secondary | ICD-10-CM | POA: Diagnosis not present

## 2024-08-13 DIAGNOSIS — D7219 Other eosinophilia: Secondary | ICD-10-CM | POA: Diagnosis not present

## 2024-08-13 DIAGNOSIS — R188 Other ascites: Secondary | ICD-10-CM | POA: Diagnosis not present

## 2024-08-13 DIAGNOSIS — D721 Eosinophilia, unspecified: Secondary | ICD-10-CM | POA: Diagnosis not present

## 2024-08-13 LAB — GLUCOSE, CAPILLARY
Glucose-Capillary: 99 mg/dL (ref 70–99)
Glucose-Capillary: 99 mg/dL (ref 70–99)
Glucose-Capillary: 125 mg/dL — ABNORMAL HIGH (ref 70–99)
Glucose-Capillary: 122 mg/dL — ABNORMAL HIGH (ref 70–99)
Glucose-Capillary: 142 mg/dL — ABNORMAL HIGH (ref 70–99)

## 2024-08-13 LAB — QUANTIFERON-TB GOLD PLUS (RQFGPL)
QuantiFERON Mitogen Value: 3.36 [IU]/mL
QuantiFERON Nil Value: 0.04 [IU]/mL
QuantiFERON TB1 Ag Value: 0.05 [IU]/mL
QuantiFERON TB2 Ag Value: 0.05 [IU]/mL

## 2024-08-13 LAB — GLUCOSE, BODY FLUID OTHER: Glucose, Body Fluid Other: 33 mg/dL

## 2024-08-13 LAB — BASIC METABOLIC PANEL WITH GFR
Anion gap: 8 (ref 5–15)
BUN: 10 mg/dL (ref 6–20)
CO2: 30 mmol/L (ref 22–32)
Calcium: 8.4 mg/dL — ABNORMAL LOW (ref 8.9–10.3)
Chloride: 101 mmol/L (ref 98–111)
Creatinine, Ser: 0.49 mg/dL (ref 0.44–1.00)
GFR, Estimated: >60 mL/min
Glucose, Bld: 90 mg/dL (ref 70–99)
Potassium: 3.7 mmol/L (ref 3.5–5.1)
Sodium: 139 mmol/L (ref 135–145)

## 2024-08-13 LAB — PROTEIN, BODY FLUID (OTHER): Total Protein, Body Fluid Other: 4.5 g/dL

## 2024-08-13 LAB — ALBUMIN: Albumin: 3.5 g/dL (ref 3.5–5.0)

## 2024-08-13 LAB — QUANTIFERON-TB GOLD PLUS: QuantiFERON-TB Gold Plus: NEGATIVE

## 2024-08-13 LAB — GIARDIA, EIA; OVA/PARASITE: Giardia Ag, Stl: NEGATIVE

## 2024-08-13 LAB — O&P RESULT

## 2024-08-13 LAB — MAGNESIUM: Magnesium: 2 mg/dL (ref 1.7–2.4)

## 2024-08-13 LAB — CYTOLOGY - NON PAP

## 2024-08-13 NOTE — Progress Notes (Addendum)
 INFECTIOUS DISEASE PROGRESS NOTE Date of Admission:  08/04/2024     ID: Tonya Mueller is a 58 y.o. 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 Tolerating albendazole   ROS  Eleven systems are reviewed and negative except per hpi  Medications:  Antibiotics Given (last 72 hours)     Date/Time Action Medication Dose Rate   08/10/24 1805 New Bag/Given   cefTRIAXone  (ROCEPHIN ) 2 g in sodium chloride  0.9 % 100 mL IVPB 2 g 200 mL/hr   08/11/24 9071 Given   albendazole  (ALBENZA ) tablet 400 mg 400 mg    08/11/24 1747 New Bag/Given   cefTRIAXone  (ROCEPHIN ) 2 g in sodium chloride  0.9 % 100 mL IVPB 2 g 200 mL/hr   08/11/24 1747 Given   albendazole  (ALBENZA ) tablet 400 mg 400 mg    08/12/24 0900 Given   albendazole  (ALBENZA ) tablet 400 mg 400 mg    08/12/24 1747 Given   albendazole  (ALBENZA ) tablet 400 mg 400 mg    08/12/24 1747 New Bag/Given   cefTRIAXone  (ROCEPHIN ) 2 g in sodium chloride  0.9 % 100 mL IVPB 2 g 200 mL/hr   08/13/24 0849 Given   albendazole  (ALBENZA ) tablet 400 mg 400 mg        albendazole   400 mg Oral BID WC   DULoxetine   60 mg Oral Daily   enoxaparin  (LOVENOX ) injection  0.5 mg/kg Subcutaneous Q24H   furosemide   40 mg Oral Daily   insulin  aspart  0-5 Units Subcutaneous QHS   insulin  aspart  0-6 Units Subcutaneous TID WC   pantoprazole   40 mg Oral Daily   rosuvastatin   10 mg Oral Daily   sodium chloride  flush  3 mL Intravenous Q12H   spironolactone   100 mg Oral Daily    Objective: Vital signs  in last 24 hours: Temp:  [98 F (36.7 C)-98.7 F (37.1 C)] 98.4 F (36.9 C) (12/23 0833) Pulse Rate:  [92-102] 99 (12/23 0833) Resp:  [16-18] 18 (12/23 0833) BP: (110-131)/(47-80) 125/72 (12/23 0833) SpO2:  [94 %-96 %] 96 % (12/23 0833) Weight:  [104.6 kg] 104.6 kg (12/23 0603) Physical Exam  Constitutional:  oriented to person, place, and time. appears well-developed and well-nourished. No distress.  HENT: [place]/AT, PERRLA, no scleral icterus Mouth/Throat: Oropharynx is clear and moist. No oropharyngeal exudate.  Cardiovascular: Normal rate, regular rhythm and normal heart sounds. Exam reveals no gallop and no friction rub.  No murmur heard.  Pulmonary/Chest: Effort normal and breath sounds normal. No respiratory distress.  has no wheezes.  Neck = supple, no nuchal rigidity Abdominal: Soft. Abd distention. Non tender Lymphadenopathy: no cervical adenopathy. No axillary adenopathy Neurological: alert and oriented to person, place, and time.  Skin: Skin is warm and dry. No rash noted. No erythema.  Psychiatric: a normal mood and affect.  behavior is normal.    Lab Results Recent Labs    08/12/24 0602 08/13/24 0615  WBC 16.4*  --   HGB 13.4  --   HCT 40.9  --   NA 140 139  K 3.3* 3.7  CL 100  101  CO2 29 30  BUN 8 10  CREATININE 0.45 0.49    Microbiology: Results for orders placed or performed during the hospital encounter of 08/04/24  Blood culture (routine x 2)     Status: None   Collection Time: 08/04/24  3:56 AM   Specimen: BLOOD  Result Value Ref Range Status   Specimen Description BLOOD BLOOD LEFT HAND  Final   Special Requests   Final    BOTTLES DRAWN AEROBIC AND ANAEROBIC Blood Culture results may not be optimal due to an inadequate volume of blood received in culture bottles   Culture   Final    NO GROWTH 5 DAYS Performed at [facility], [address] [place], [place] [address]    Report Status 08/09/2024 FINAL  Final  Blood culture (routine x 2)      Status: None   Collection Time: 08/04/24  4:06 AM   Specimen: BLOOD  Result Value Ref Range Status   Specimen Description BLOOD BLOOD RIGHT HAND  Final   Special Requests   Final    BOTTLES DRAWN AEROBIC AND ANAEROBIC Blood Culture results may not be optimal due to an inadequate volume of blood received in culture bottles   Culture   Final    NO GROWTH 5 DAYS Performed at [facility], [address], [place], [place] [address]    Report Status 08/09/2024 FINAL  Final  C Difficile Quick Screen w PCR reflex     Status: None   Collection Time: 08/04/24  4:36 AM   Specimen: STOOL  Result Value Ref Range Status   C Diff antigen NEGATIVE NEGATIVE Final   C Diff toxin NEGATIVE NEGATIVE Final   C Diff interpretation No C. difficile detected.  Final    Comment: Performed at [facility], [address] [place], [place] [address]  Gastrointestinal Panel by PCR , Stool     Status: None   Collection Time: 08/04/24  4:36 AM   Specimen: STOOL  Result Value Ref Range Status   Campylobacter species NOT DETECTED NOT DETECTED Final   Plesimonas shigelloides NOT DETECTED NOT DETECTED Final   Salmonella species NOT DETECTED NOT DETECTED Final   Yersinia enterocolitica NOT DETECTED NOT DETECTED Final   Vibrio species NOT DETECTED NOT DETECTED Final   Vibrio cholerae NOT DETECTED NOT DETECTED Final   Enteroaggregative E coli (EAEC) NOT DETECTED NOT DETECTED Final   Enteropathogenic E coli (EPEC) NOT DETECTED NOT DETECTED Final   Enterotoxigenic E coli (ETEC) NOT DETECTED NOT DETECTED Final   Shiga like toxin producing E coli (STEC) NOT DETECTED NOT DETECTED Final   Shigella/Enteroinvasive E coli (EIEC) NOT DETECTED NOT DETECTED Final   Cryptosporidium NOT DETECTED NOT DETECTED Final   Cyclospora cayetanensis NOT DETECTED NOT DETECTED Final   Entamoeba histolytica NOT DETECTED NOT DETECTED Final   Giardia lamblia NOT DETECTED NOT DETECTED Final   Adenovirus F40/41 NOT  DETECTED NOT DETECTED Final   Astrovirus NOT DETECTED NOT DETECTED Final   Norovirus GI/GII NOT DETECTED NOT DETECTED Final   Rotavirus A NOT DETECTED NOT DETECTED Final   Sapovirus (I, II, IV, and V) NOT DETECTED NOT DETECTED Final    Comment: Performed at [facility], [address] [place], [place] [address]  Body fluid culture w Gram Stain     Status: None   Collection Time: 08/07/24 11:29 AM   Specimen: PATH Cytology Peritoneal fluid  Result Value Ref Range Status   Specimen Description  Final    PERITONEAL Performed at [facility], [address] [place], [place] [address]    Special Requests   Final    NONE Performed at [facility], [address] [place], [place] [address]    Gram Stain   Final    FEW WBC PRESENT, PREDOMINANTLY PMN NO ORGANISMS SEEN    Culture   Final    NO GROWTH 3 DAYS Performed at [facility], [address], [place], [place] [address]    Report Status 08/10/2024 FINAL  Final  Acid Fast Smear (AFB)     Status: None   Collection Time: 08/07/24 11:29 AM   Specimen: Peritoneal Dialysis; Peritoneal Fluid  Result Value Ref Range Status   AFB Specimen Processing Concentration  Final   Acid Fast Smear Negative  Final    Comment: (NOTE) Performed At: [facility] [address] [place], [place] [id number] Jennette Shorter MD Ey:[id number]    Source (AFB) PERITONEAL  Final    Comment: Performed at [facility], [address] [place], [place] [address]  Giardia, EIA; Ova/Parasite     Status: None   Collection Time: 08/08/24  9:47 AM   Specimen: Stool  Result Value Ref Range Status   Ova + Parasite Exam Final report  Final    Comment: (NOTE) These results were obtained using wet preparation(s) and trichrome stained smear. This test does not include testing for Cryptosporidium parvum, Cyclospora, or Microsporidia.    Giardia Ag, Stl Negative Negative Final    Comment: (NOTE) Performed At:  [facility] [address] [place], [place] [id number] Jennette Shorter MD Ey:[id number] Performed At: [facility] [address] [place], [place] [id number] Cherilyn Pipe T MD Ph:[phone]   Culture, blood (Routine X 2) w Reflex to ID Panel     Status: None (Preliminary result)   Collection Time: 08/09/24  5:22 PM   Specimen: BLOOD  Result Value Ref Range Status   Specimen Description BLOOD BLOOD RIGHT ARM  Final   Special Requests   Final    BOTTLES DRAWN AEROBIC AND ANAEROBIC Blood Culture adequate volume   Culture   Final    NO GROWTH 4 DAYS Performed at [facility], [address], [place], [place] [address]    Report Status PENDING  Incomplete  Culture, blood (Routine X 2) w Reflex to ID Panel     Status: None (Preliminary result)   Collection Time: 08/09/24  5:28 PM   Specimen: BLOOD  Result Value Ref Range Status   Specimen Description BLOOD BLOOD LEFT ARM  Final   Special Requests   Final    BOTTLES DRAWN AEROBIC AND ANAEROBIC Blood Culture adequate volume   Culture   Final    NO GROWTH 4 DAYS Performed at [facility], [address] [place], [place] [address]    Report Status PENDING  Incomplete  Body fluid culture w Gram Stain     Status: None (Preliminary result)   Collection Time: 08/12/24  1:14 PM   Specimen: PATH Cytology Peritoneal fluid  Result Value Ref Range Status   Specimen Description   Final    PERITONEAL Performed at [facility], [address] [place], [place] [address]    Special Requests   Final    PERITONEAL Performed at [facility], [address] [place], [place] [address]    Gram Stain   Final    RARE WBC PRESENT, PREDOMINANTLY PMN NO ORGANISMS SEEN  Culture   Final    NO GROWTH < 24 HOURS Performed at [facility], [address], [place], [place] [address]    Report Status PENDING  Incomplete    Studies/Results: US  Paracentesis Result Date:  08/12/2024 INDICATION: The 58 year old female with a history of NASH cirrhosis, admitted for abdominal pain/ diarrhea. Now noted with new onset ascites. IR is requested for repeat diagnostic and therapeutic paracentesis. EXAM: ULTRASOUND GUIDED DIAGNOSTIC AND THERAPEUTIC PARACENTESIS MEDICATIONS: 9 mL of 1% lidocaine . COMPLICATIONS: None immediate. PROCEDURE: Informed written consent was obtained from the patient after a discussion of the risks, benefits and alternatives to treatment. A timeout was performed prior to the initiation of the procedure. Initial ultrasound scanning demonstrates a moderate amount of ascites within the right lower abdominal quadrant. The right lower abdomen was prepped and draped in the usual sterile fashion. 1% lidocaine  was used for local anesthesia. Following this, a 19 gauge, 7-cm, Yueh catheter was introduced. An ultrasound image was saved for documentation purposes. The paracentesis was performed. The catheter was removed and a dressing was applied. The patient tolerated the procedure well without immediate post procedural complication. FINDINGS: A total of approximately 1.8 L of clear, straw-colored peritoneal fluid was removed. Samples were sent to the laboratory as requested by the clinical team. IMPRESSION: Successful ultrasound-guided paracentesis yielding 1.8 liters of peritoneal fluid. Procedure performed by Carlin Griffon, PA-C, under the direct supervision of Dr. Marcey Moan, MD Electronically Signed   By: Marcey Moan M.D.   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 female with peripheral eosinophilia, abd pain, ascitics with profound eosinophilia. She is from [place]. HIV neg. Strongyloides negative.    On review she has had largley nml Eosinophilia counts but in oct 2023 had eosinophil count of 3000.  She had CT scan 2015 with moderate ascites  but unclear work up at that timeOnc workup pending    Given prior eosinophilia several  years ago and prior CT in 2015 with moderate ascites this is likely not infectious given prolonged nature.   December 23-her follow-up paracentesis initially showed increasing white count up from 8000-13,000 and was initially read as predominantly PMNs but with path review this has been 96% eosinophils.  She has started albendazole .  Culture of her peritoneal fluid from December 17 is negative.  AFB is negative smear culture pending.  Ova and parasite smear was negative cultures done on December 22 to pending for fungal and AFB as well.  Routine is also negative. she has had negative ANCA testing, IgE is elevated at 768 strongyloidiasis antibody testing negative.  Cytology from the December 17 peritoneal fluid negative for malignancy and just shows acute inflammation.  She has had small intestinal biopsy consistent with chronic peptic duodenitis and colon biopsy negative.  No mention of eosinophils. Pending results include QuantiFERON gold, cocci CDU antibody testing histoplasma urinary antigen Recommendations Peripheral eosinophila and eosinophilic ascites Pending  coccidiomycosis (I reordered as wrong tube drawn) , histoplasma. Urine ag (Also reordered ), QFG, AFB and fungal cultures from ascitic fluid. Ordered schistomiasis ab, toxocara ab Can stop ceftriaxone  Can dc when stable to finish albendazone course for 7 days and I can see in 3 weeks at [facility] ID clinic to repeat Eosinophil count     Alm SHAUNNA Needle   08/13/2024, 2:00 PM

## 2024-08-13 NOTE — Plan of Care (Signed)

## 2024-08-13 NOTE — Progress Notes (Signed)
 " Progress Note   Patient: Tonya Mueller FMW:[id number] DOB: [birth year] DOA: 08/04/2024     9 DOS: the patient was seen and examined on 08/13/2024   Brief hospital course: 58 y.o. female with medical history significant of diabetes mellitus 2 on Ozempic, GERD, NASH, recent diagnosis of sleep apnea now on CPAP, obesity with BMI 43, dyslipidemia, cervical radiculopathy/cervical spinal stenosis with history of prior injection therapies to treat pain.  Patient presented to the ED complaining of 3 weeks of upper abdominal pain with diarrhea worse after eating    12/15: GI consult 12/16: EGD and colonoscopy appearance is negative but biopsies were taken, added Phenergan for refractory nausea and vomiting, try clear liquid diet 12/17.  Advance to solid food.  Discontinue IV fluids.  Paracentesis showing eosinophils.  Started on empiric antibiotics. 12/18.  CBC with differential showing eosinophilia, sed rate of 4 12/19.  ANA negative, ANCA negative, Strongyloides, IgE negative.  Consulted oncology.  Oncology ordered BCR-ABL, flow cytometry, myeloid NGS, pro BNP, T-cell panel and tryptase. 12/20.  Patient feels a little bit better today.  Still has abdominal distention.  Still has some diarrhea.  Eating a little bit better. 12/21.  Patient wants to start treatment empirically for parasite.  Case discussed with infectious disease specialist and we will prescribe albendazole  400 mg twice daily for 7 days. 12/22.  Continue albendazole .  Abdominal paracentesis drew off another 1.8 L with eosinophilic predominance.  Since tryptase level came back normal oncology canceled the bone marrow biopsy. 12/23.  Pharmacy staff tried to obtain the remainder of the albendazole  prescription for patient.  Patient still having some abdominal discomfort and diarrhea.   Assessment and Plan: * Abdominal ascites Continue empiric albendazole  400 mg twice daily.   High eosinophil count in abdominal fluid x 2.  Patient completed  empiric Rocephin  and this was discontinued.  Patient on increased dose of Aldactone  and low-dose Lasix .  Sedimentation rate normal.  CRP low.  ANA and ANCA are negative.  IgG Strongyloides antibody negative. Cytology negative. Oncology sent off testing which is still pending.  If no improvement with albendazole  patient may need a peritoneal biopsy to rule out tuberculosis.  Eosinophilia, unspecified Eosinophils seen in ascitic fluid and on CBC with differential.  Undergoing workup.  On empiric albendazole .  Consulted oncology and she ordered a BCR-ABL, flow cytometry, myeloid NGS, proBNP, T-cell panel and tryptase.  Cytology of the ascites fluid negative.  Since tryptase is negative, oncology decided to hold off on bone marrow biopsy and follow-up as outpatient after treatment course with albendazole .  Stomach upset EGD negative.  Biopsy consistent with chronic peptic duodenitis.  Continue Protonix .  Diarrhea Stool studies negative.  Colonoscopy appeared normal.  Biopsies negative.  As needed Imodium . Giardia came back negative.  Controlled type 2 diabetes mellitus without complication, without long-term current use of insulin  (HCC) Hold Ozempic  Hyperlipidemia, unspecified On statin  Sleep apnea On CPAP  Obesity, Class III, BMI 40-49.9 (morbid obesity) (HCC) Continue to monitor with starting diuresis.  BMI 43.27  Hypokalemia Replace potassium today and increase Aldactone .        Subjective: Patient nervous about going home since she is still having some back pain and diarrhea.  Empirically started treatment with albendazole  just in case this is a parasite causing the eosinophilia and the ascites and peripheral ischemia failure.  Physical Exam: Vitals:   08/13/24 0402 08/13/24 0603 08/13/24 0833 08/13/24 1615  BP: (!) 115/47 110/63 125/72 123/78  Pulse: 92 98 99 (!)  102  Resp: 16  18 16   Temp: 98 F (36.7 C)  98.4 F (36.9 C) 98.4 F (36.9 C)  TempSrc:      SpO2: 94% 96%  96% 97%  Weight:  104.6 kg    Height:       Physical Exam HENT:     Head: Normocephalic.  Eyes:     General: Lids are normal.     Conjunctiva/sclera: Conjunctivae normal.  Cardiovascular:     Rate and Rhythm: Normal rate and regular rhythm.     Heart sounds: Normal heart sounds, S1 normal and S2 normal.  Pulmonary:     Breath sounds: No decreased breath sounds, wheezing, rhonchi or rales.  Abdominal:     General: There is distension.     Palpations: Abdomen is soft.     Tenderness: There is generalized abdominal tenderness.  Musculoskeletal:     Right lower leg: Swelling present.     Left lower leg: Swelling present.  Skin:    General: Skin is warm.     Findings: No rash.  Neurological:     Mental Status: She is alert and oriented to person, place, and time.     Data Reviewed: Potassium 3.7, creatinine 0.49, last white blood count 16.4  Disposition: Status is: Inpatient Remains inpatient appropriate because: Pharmacy staff tried to find out where we cannot get the remainder of the albendazole  course.  Planned Discharge Destination: Home    Time spent: 28 minutes  Author: Charlie Patterson, MD 08/13/2024 4:21 PM  For on call review [email or web address].  "

## 2024-08-13 NOTE — Plan of Care (Signed)
" °  Problem: Coping: Goal: Ability to adjust to condition or change in health will improve Outcome: Progressing   Problem: Fluid Volume: Goal: Ability to maintain a balanced intake and output will improve Outcome: Progressing   Problem: Metabolic: Goal: Ability to maintain appropriate glucose levels will improve Outcome: Progressing   Problem: Education: Goal: Knowledge of General Education information will improve Description: Including pain rating scale, medication(s)/side effects and non-pharmacologic comfort measures Outcome: Progressing   Problem: Pain Managment: Goal: General experience of comfort will improve and/or be controlled Outcome: Progressing   "

## 2024-08-14 ENCOUNTER — Other Ambulatory Visit: Payer: Self-pay

## 2024-08-14 DIAGNOSIS — D721 Eosinophilia, unspecified: Secondary | ICD-10-CM | POA: Diagnosis not present

## 2024-08-14 DIAGNOSIS — R188 Other ascites: Secondary | ICD-10-CM | POA: Diagnosis not present

## 2024-08-14 DIAGNOSIS — R197 Diarrhea, unspecified: Secondary | ICD-10-CM | POA: Diagnosis not present

## 2024-08-14 LAB — GLUCOSE, CAPILLARY
Glucose-Capillary: 103 mg/dL — ABNORMAL HIGH (ref 70–99)
Glucose-Capillary: 152 mg/dL — ABNORMAL HIGH (ref 70–99)
Glucose-Capillary: 109 mg/dL — ABNORMAL HIGH (ref 70–99)
Glucose-Capillary: 102 mg/dL — ABNORMAL HIGH (ref 70–99)

## 2024-08-14 LAB — ALBUMIN, FLUID (OTHER)
Albumin, Body Fluid Other: 2.9 g/dL
Albumin, Body Fluid Other: 3 g/dL

## 2024-08-14 LAB — COMP PANEL: LEUKEMIA/LYMPHOMA

## 2024-08-14 LAB — PROTEIN, BODY FLUID (OTHER): Total Protein, Body Fluid Other: 4.5 g/dL

## 2024-08-14 LAB — ACID FAST SMEAR (AFB, MYCOBACTERIA): Acid Fast Smear: NEGATIVE

## 2024-08-14 LAB — CULTURE, BLOOD (ROUTINE X 2)
Culture: NO GROWTH
Culture: NO GROWTH
Special Requests: ADEQUATE
Special Requests: ADEQUATE

## 2024-08-14 LAB — LD, BODY FLUID (OTHER): LD, Body Fluid: 379 IU/L

## 2024-08-14 MED ORDER — PANCRELIPASE (LIP-PROT-AMYL) 12000-38000 UNITS PO CPEP
24000.0000 [IU] | ORAL_CAPSULE | Freq: Three times a day (TID) | ORAL | Status: DC
  Administered 2024-08-14 – 2024-08-15 (×2): 24000 [IU] via ORAL
  Filled 2024-08-14 (×4): qty 2

## 2024-08-14 MED ORDER — ALBENDAZOLE 200 MG PO TABS
400.0000 mg | ORAL_TABLET | Freq: Two times a day (BID) | ORAL | 0 refills | Status: AC
  Filled 2024-08-14: qty 12, 3d supply, fill #0

## 2024-08-14 NOTE — Plan of Care (Signed)

## 2024-08-14 NOTE — Progress Notes (Signed)
 " Progress Note   Patient: Tonya Mueller FMW:[id number] DOB: 1966/05/23 DOA: 08/04/2024     10 DOS: the patient was seen and examined on 08/14/2024   Brief hospital course: 58 y.o. female with medical history significant of diabetes mellitus 2 on Ozempic, GERD, NASH, recent diagnosis of sleep apnea now on CPAP, obesity with BMI 43, dyslipidemia, cervical radiculopathy/cervical spinal stenosis with history of prior injection therapies to treat pain.  Patient presented to the ED complaining of 3 weeks of upper abdominal pain with diarrhea worse after eating   EGD and colonoscopy appearance is negative, pathology came back with duodenitis.  Colon sample was negative. Paracentesis performed on 1217 showing eosinophils.  Started on empiric antibiotics. ANA negative, ANCA negative, Strongyloides, IgE negative.   Oncology ordered BCR-ABL, flow cytometry, myeloid NGS, pro BNP, T-cell panel and tryptase.  Albendazole  400 mg twice daily ordered on 12/21 for 7 days.  Per recommendation from ID. 12/22.   Abdominal paracentesis drew off another 1.8 L with eosinophilic predominance.  Since tryptase level came back normal oncology canceled the bone marrow biopsy.     Principal Problem:   Abdominal ascites Active Problems:   Eosinophilia, unspecified   Diarrhea   Stomach upset   Controlled type 2 diabetes mellitus without complication, without long-term current use of insulin  (HCC)   Hyperlipidemia, unspecified   Sleep apnea   Obesity, Class III, BMI 40-49.9 (morbid obesity) (HCC)   SBP (spontaneous bacterial peritonitis) (HCC)   Hypokalemia   Assessment and Plan: * Abdominal ascites Eosinophilia Watery diarrhea. Continue empiric albendazole  400 mg twice daily.   High eosinophil count in abdominal fluid x 2.  Patient completed empiric Rocephin  and this was discontinued.  Patient on increased dose of Aldactone  and low-dose Lasix .  Sedimentation rate normal.  CRP low.  ANA and ANCA are negative.  IgG  Strongyloides antibody negative. Cytology negative. Oncology sent off testing which is still pending.  If no improvement with albendazole  patient may need a peritoneal biopsy to rule out tuberculosis. Per ID, patient can be discharged when symptoms is better.  She will follow-up with ID as outpatient. However, patient still complaining of significant watery diarrhea.  Added Creon , continue as needed Imodium .  Also sent out stool for parasites and ova.  Initial GI panel negative.  Stomach upset secondary to duodenitis. EGD negative.  Biopsy consistent with chronic peptic duodenitis.  Continue Protonix .  Controlled type 2 diabetes mellitus without complication, without long-term current use of insulin  (HCC) Hold Ozempic, continue sliding scale insulin .  Hyperlipidemia, unspecified On statin  Sleep apnea On CPAP  Obesity, Class III, BMI 40-49.9 (morbid obesity) (HCC) Continue to monitor with starting diuresis.  BMI 43.27  Hypokalemia Recheck labs tomorrow       Subjective:  Patient still complaining of significant diarrhea which was watery, no blood.  No nausea vomiting.  Physical Exam: Vitals:   08/13/24 1615 08/13/24 1943 08/14/24 0428 08/14/24 0812  BP: 123/78 118/71 120/77 118/75  Pulse: (!) 102 (!) 101 92 98  Resp: 16 18 18 18   Temp: 98.4 F (36.9 C) 98.4 F (36.9 C) 97.9 F (36.6 C) 98.5 F (36.9 C)  TempSrc:      SpO2: 97% 94% 95% 93%  Weight:   103.3 kg   Height:       General exam: Appears calm and comfortable  Respiratory system: Clear to auscultation. Respiratory effort normal. Cardiovascular system: S1 & S2 heard, RRR. No JVD, murmurs, rubs, gallops or clicks. No pedal edema. Gastrointestinal  system: Abdomen is nondistended, soft and nontender. No organomegaly or masses felt. Normal bowel sounds heard. Central nervous system: Alert and oriented. No focal neurological deficits. Extremities: Symmetric 5 x 5 power. Skin: No rashes, lesions or  ulcers Psychiatry: Judgement and insight appear normal. Mood & affect appropriate.    Data Reviewed:  Lab results reviewed.  Family Communication: None  Disposition: Status is: Inpatient Remains inpatient appropriate because: Severity of disease,     Time spent: 35 minutes  Author: Murvin Mana, MD 08/14/2024 2:12 PM  For on call review [email or web address].    "

## 2024-08-15 DIAGNOSIS — D721 Eosinophilia, unspecified: Secondary | ICD-10-CM | POA: Diagnosis not present

## 2024-08-15 DIAGNOSIS — R188 Other ascites: Secondary | ICD-10-CM | POA: Diagnosis not present

## 2024-08-15 DIAGNOSIS — R197 Diarrhea, unspecified: Secondary | ICD-10-CM | POA: Diagnosis not present

## 2024-08-15 LAB — CBC
HCT: 41.6 % (ref 36.0–46.0)
Hemoglobin: 13.1 g/dL (ref 12.0–15.0)
MCH: 28.3 pg (ref 26.0–34.0)
MCHC: 31.5 g/dL (ref 30.0–36.0)
MCV: 89.8 fL (ref 80.0–100.0)
Platelets: 195 K/uL (ref 150–400)
RBC: 4.63 MIL/uL (ref 3.87–5.11)
RDW: 13.5 % (ref 11.5–15.5)
WBC: 15.3 K/uL — ABNORMAL HIGH (ref 4.0–10.5)
nRBC: 0 % (ref 0.0–0.2)

## 2024-08-15 LAB — MAGNESIUM: Magnesium: 1.9 mg/dL (ref 1.7–2.4)

## 2024-08-15 LAB — BODY FLUID CULTURE W GRAM STAIN: Culture: NO GROWTH

## 2024-08-15 LAB — BASIC METABOLIC PANEL WITH GFR
Anion gap: 10 (ref 5–15)
BUN: 8 mg/dL (ref 6–20)
CO2: 29 mmol/L (ref 22–32)
Calcium: 8.4 mg/dL — ABNORMAL LOW (ref 8.9–10.3)
Chloride: 103 mmol/L (ref 98–111)
Creatinine, Ser: 0.61 mg/dL (ref 0.44–1.00)
GFR, Estimated: >60 mL/min
Glucose, Bld: 113 mg/dL — ABNORMAL HIGH (ref 70–99)
Potassium: 3.6 mmol/L (ref 3.5–5.1)
Sodium: 142 mmol/L (ref 135–145)

## 2024-08-15 LAB — GLUCOSE, CAPILLARY: Glucose-Capillary: 128 mg/dL — ABNORMAL HIGH (ref 70–99)

## 2024-08-15 LAB — PHOSPHORUS: Phosphorus: 4.2 mg/dL (ref 2.5–4.6)

## 2024-08-15 MED ORDER — PANCRELIPASE (LIP-PROT-AMYL) 24000-76000 UNITS PO CPEP: 24000.0000 [IU] | ORAL_CAPSULE | Freq: Three times a day (TID) | ORAL | 0 refills | Status: AC

## 2024-08-15 MED ORDER — LOPERAMIDE HCL 2 MG PO CAPS: 2.0000 mg | ORAL_CAPSULE | Freq: Three times a day (TID) | ORAL | 0 refills | Status: DC | PRN

## 2024-08-15 MED ORDER — DICYCLOMINE HCL 10 MG PO CAPS: 10.0000 mg | ORAL_CAPSULE | Freq: Four times a day (QID) | ORAL | 0 refills | Status: DC | PRN

## 2024-08-15 MED ORDER — OMEPRAZOLE 20 MG PO CPDR: 40.0000 mg | DELAYED_RELEASE_CAPSULE | Freq: Every day | ORAL | 0 refills | Status: AC

## 2024-08-15 MED ORDER — DICYCLOMINE HCL 10 MG PO CAPS: 10.0000 mg | ORAL_CAPSULE | Freq: Three times a day (TID) | ORAL | Status: DC | PRN

## 2024-08-15 MED ORDER — PANCRELIPASE (LIP-PROT-AMYL) 24000-76000 UNITS PO CPEP: 24000.0000 [IU] | ORAL_CAPSULE | Freq: Three times a day (TID) | ORAL | 0 refills | Status: DC

## 2024-08-15 MED ORDER — DICYCLOMINE HCL 10 MG PO CAPS: 10.0000 mg | ORAL_CAPSULE | Freq: Four times a day (QID) | ORAL | 0 refills | Status: AC | PRN

## 2024-08-15 MED ORDER — ROSUVASTATIN CALCIUM 10 MG PO TABS: 10.0000 mg | ORAL_TABLET | Freq: Every day | ORAL | 0 refills | Status: DC

## 2024-08-15 MED ORDER — FUROSEMIDE 40 MG PO TABS: 40.0000 mg | ORAL_TABLET | Freq: Every day | ORAL | 0 refills | Status: DC

## 2024-08-15 MED ORDER — OMEPRAZOLE 20 MG PO CPDR: 40.0000 mg | DELAYED_RELEASE_CAPSULE | Freq: Every day | ORAL | 0 refills | Status: DC

## 2024-08-15 MED ORDER — SPIRONOLACTONE 100 MG PO TABS: 100.0000 mg | ORAL_TABLET | Freq: Every day | ORAL | 0 refills | Status: DC

## 2024-08-15 MED ORDER — FUROSEMIDE 40 MG PO TABS: 40.0000 mg | ORAL_TABLET | Freq: Every day | ORAL | 0 refills | Status: AC

## 2024-08-15 MED ORDER — ROSUVASTATIN CALCIUM 10 MG PO TABS: 10.0000 mg | ORAL_TABLET | Freq: Every day | ORAL | 0 refills | Status: AC

## 2024-08-15 MED ORDER — SPIRONOLACTONE 100 MG PO TABS: 100.0000 mg | ORAL_TABLET | Freq: Every day | ORAL | 0 refills | Status: AC

## 2024-08-15 MED ORDER — LOPERAMIDE HCL 2 MG PO CAPS: 2.0000 mg | ORAL_CAPSULE | Freq: Three times a day (TID) | ORAL | 0 refills | Status: AC | PRN

## 2024-08-15 NOTE — Progress Notes (Signed)
 The patients Tonya Mueller came back at 3.5 with the ascites albumin being 2.9. This is not consistent with portal hypertension due to it not being a gradient over 1.1. This is now being worked up for other causes other than G.I. with ID and the hospital service. Nothing further to do from a point of view.  Will sign off at this time.

## 2024-08-15 NOTE — Discharge Summary (Signed)
 " Physician Discharge Summary   Patient: Tonya Mueller MRN: [id number] DOB: 06-27-1966  Admit date:     08/04/2024  Discharge date: 08/15/2024  Discharge Physician: Murvin Mana   PCP: [name], NP   Recommendations at discharge:   Follow-up with PCP in 1 week. Follow-up with infectious disease in 1 week. Follow-up with GI in 5 to 7 days  Discharge Diagnoses: Principal Problem:   Abdominal ascites Active Problems:   Eosinophilia, unspecified   Diarrhea   Stomach upset   Controlled type 2 diabetes mellitus without complication, without long-term current use of insulin  (HCC)   Hyperlipidemia, unspecified   Sleep apnea   Obesity, Class III, BMI 40-49.9 (morbid obesity) (HCC)   SBP (spontaneous bacterial peritonitis) (HCC)   Hypokalemia  Resolved Problems:   * No resolved hospital problems. *  Hospital Course: 58 y.o. female with medical history significant of diabetes mellitus 2 on Ozempic, GERD, NASH, recent diagnosis of sleep apnea now on CPAP, obesity with BMI 43, dyslipidemia, cervical radiculopathy/cervical spinal stenosis with history of prior injection therapies to treat pain.  Patient presented to the ED complaining of 3 weeks of upper abdominal pain with diarrhea worse after eating   EGD and colonoscopy appearance is negative, pathology came back with duodenitis.  Colon sample was negative. Paracentesis performed on 1217 showing eosinophils.  Started on empiric antibiotics. ANA negative, ANCA negative, Strongyloides, IgE negative.   Oncology ordered BCR-ABL, flow cytometry, myeloid NGS, pro BNP, T-cell panel and tryptase.  Albendazole  400 mg twice daily ordered on 12/21 for 7 days.  Per recommendation from ID. 12/22.   Abdominal paracentesis drew off another 1.8 L with eosinophilic predominance.  Since tryptase level came back normal oncology canceled the bone marrow biopsy. Patient condition has improved, diarrhea is slowing down.  Medically stable for  discharge.   Assessment and Plan: * Abdominal ascites Eosinophilia Watery diarrhea. Continue empiric albendazole  400 mg twice daily.   High eosinophil count in abdominal fluid x 2.  Patient completed empiric Rocephin  and this was discontinued.  Patient on increased dose of Aldactone  and low-dose Lasix .  Sedimentation rate normal.  CRP low.  ANA and ANCA are negative.  IgG Strongyloides antibody negative. Cytology negative. Oncology sent off testing which is still pending.  If no improvement with albendazole  patient may need a peritoneal biopsy to rule out tuberculosis. Per ID, patient can be discharged when symptoms is better.  She will follow-up with ID as outpatient. Patient diarrhea is slowing down after taking Creon .  Currently, medically stable for discharge.  Patient will be followed by GI, ID and PCP as outpatient.   Stomach upset secondary to duodenitis. EGD negative.  Biopsy consistent with chronic peptic duodenitis.  Continue PPI.   Controlled type 2 diabetes mellitus without complication, without long-term current use of insulin  (HCC) Hold Ozempic, may resume after seen by PCP.   Hyperlipidemia, unspecified On statin   Sleep apnea On CPAP   Obesity, Class III, BMI 40-49.9 (morbid obesity) (HCC) Continue to monitor with starting diuresis.  BMI 43.27   Hypokalemia Condition resolved.         Consultants: ID and GI Procedures performed: Paracentesis Disposition: Home Diet recommendation:  Discharge Diet Orders (From admission, onward)     Start     Ordered   08/15/24 0000  Diet - low sodium heart healthy        08/15/24 0837           Cardiac diet DISCHARGE MEDICATION: Allergies as  of 08/15/2024       Reactions   Cortisone Other (See Comments), Swelling   Patient experienced severe stomach pains and bloating; 05/24/22: pt has had an injection recently without issues Liver failure  Liver failure - subacute & reversible; diagnosed after she had severe  bloating & stomach pain after numerous cortisone injections over a 6 month timeframe. Has had isolated cortisone injections which were better spaced out without these difficulties & has tolerated oral steroids without difficulty in short courses as well. This reaction only occurred after the multiple injections within that 6 months.  Liver failure - subacute & reversible; diagnosed after she had severe bloating & stomach pain after numerous cortisone injections over a 6 month timeframe. Has had isolated cortisone injections which were better spaced out without these difficulties & has tolerated oral steroids without difficulty in short courses as well. This reaction only occurred after the multiple injections within that 6 months.        Medication List     STOP taking these medications    atorvastatin  20 MG tablet Commonly known as: LIPITOR   gabapentin  100 MG capsule Commonly known as: NEURONTIN    Ozempic (1 MG/DOSE) 4 MG/3ML Sopn Generic drug: Semaglutide (1 MG/DOSE)       TAKE these medications    albendazole  200 MG tablet Commonly known as: ALBENZA  Take 2 tablets (400 mg total) by mouth 2 (two) times daily with a meal for 3 days.   dicyclomine  10 MG capsule Commonly known as: BENTYL  Take 1 capsule (10 mg total) by mouth 4 (four) times daily as needed.   DULoxetine  60 MG capsule Commonly known as: CYMBALTA  Take 60 mg by mouth daily.   EPINEPHrine  0.3 mg/0.3 mL Soaj injection Commonly known as: EPI-PEN Inject 0.3 mg into the muscle as needed.   fluticasone 50 MCG/ACT nasal spray Commonly known as: FLONASE Place into both nostrils daily.   furosemide  40 MG tablet Commonly known as: LASIX  Take 1 tablet (40 mg total) by mouth daily.   ipratropium 0.03 % nasal spray Commonly known as: ATROVENT Place 2 sprays into both nostrils every 12 (twelve) hours.   levocetirizine 5 MG tablet Commonly known as: XYZAL Take 5 mg by mouth every evening.   loperamide  2 MG  capsule Commonly known as: IMODIUM  Take 1 capsule (2 mg total) by mouth every 8 (eight) hours as needed for diarrhea or loose stools.   omeprazole  20 MG capsule Commonly known as: PRILOSEC Take 2 capsules (40 mg total) by mouth daily. What changed: how much to take   Pancrelipase  (Lip-Prot-Amyl) 24000-76000 units Cpep Take 1 capsule (24,000 Units total) by mouth 3 (three) times daily before meals.   rosuvastatin  10 MG tablet Commonly known as: CRESTOR  Take 1 tablet (10 mg total) by mouth daily.   senna 8.6 MG Tabs tablet Commonly known as: SENOKOT Take 1 tablet (8.6 mg total) by mouth daily as needed for mild constipation.   spironolactone  100 MG tablet Commonly known as: ALDACTONE  Take 1 tablet (100 mg total) by mouth daily.   sucralfate 1 g tablet Commonly known as: CARAFATE Take 1 g by mouth 4 (four) times daily -  with meals and at bedtime.   tiZANidine  4 MG tablet Commonly known as: ZANAFLEX  TAKE 1 TABLET BY MOUTH 3 TIMES DAILY.   triamcinolone  cream 0.1 % Commonly known as: KENALOG  Apply 1 Application topically 2 (two) times daily as needed.        Follow-up Information     Steva,  Clotilda DEL, NP Follow up in 5 day(s).   Specialty: Family Medicine Contact information: [address] [place] [address] [phone]         Onita Elspeth Sharper, DO Follow up in 1 week(s).   Specialty: Gastroenterology Contact information: [address] [facility] [place] [address] [phone]         Epifanio Alm SQUIBB, MD Follow up in 1 week(s).   Specialty: Infectious Diseases Contact information: [address] [place] [address] [phone]                Discharge Exam: Tonya Mueller   08/04/24 9383 08/13/24 0603 08/14/24 0428  Weight: 103.9 kg 104.6 kg 103.3 kg   General exam: Appears calm and comfortable, obese Respiratory system: Clear to auscultation. Respiratory effort normal. Cardiovascular  system: S1 & S2 heard, RRR. No JVD, murmurs, rubs, gallops or clicks. No pedal edema. Gastrointestinal system: Abdomen is nondistended, soft and nontender. No organomegaly or masses felt. Normal bowel sounds heard. Central nervous system: Alert and oriented. No focal neurological deficits. Extremities: Symmetric 5 x 5 power. Skin: No rashes, lesions or ulcers Psychiatry: Judgement and insight appear normal. Mood & affect appropriate.    Condition at discharge: good  The results of significant diagnostics from this hospitalization (including imaging, microbiology, ancillary and laboratory) are listed below for reference.   Imaging Studies: US  Paracentesis Result Date: 08/12/2024 INDICATION: The 58 year old female with a history of NASH cirrhosis, admitted for abdominal pain/ diarrhea. Now noted with new onset ascites. IR is requested for repeat diagnostic and therapeutic paracentesis. EXAM: ULTRASOUND GUIDED DIAGNOSTIC AND THERAPEUTIC PARACENTESIS MEDICATIONS: 9 mL of 1% lidocaine . COMPLICATIONS: None immediate. PROCEDURE: Informed written consent was obtained from the patient after a discussion of the risks, benefits and alternatives to treatment. A timeout was performed prior to the initiation of the procedure. Initial ultrasound scanning demonstrates a moderate amount of ascites within the right lower abdominal quadrant. The right lower abdomen was prepped and draped in the usual sterile fashion. 1% lidocaine  was used for local anesthesia. Following this, a 19 gauge, 7-cm, Yueh catheter was introduced. An ultrasound image was saved for documentation purposes. The paracentesis was performed. The catheter was removed and a dressing was applied. The patient tolerated the procedure well without immediate post procedural complication. FINDINGS: A total of approximately 1.8 L of clear, straw-colored peritoneal fluid was removed. Samples were sent to the laboratory as requested by the clinical team.  IMPRESSION: Successful ultrasound-guided paracentesis yielding 1.8 liters of peritoneal fluid. Procedure performed by Carlin Griffon, PA-C, under the direct supervision of Dr. Marcey Moan, MD Electronically Signed   By: Marcey Moan M.D.   On: 08/12/2024 13:59   ECHOCARDIOGRAM COMPLETE Result Date: 08/11/2024    ECHOCARDIOGRAM REPORT   Patient Name:   Tonya Mueller Date of Exam: 08/11/2024 Medical Rec #:  [id number]      Height:       61.0 in Accession #:    [id number]     Weight:       229.0 lb Date of Birth:  01-30-1966       BSA:          2.001 m Patient Age:    58 years       BP:           130/89 mmHg Patient Gender: F              HR:  92 bpm. Exam Location:  [facility] Procedure: 2D Echo (Both Spectral and Color Flow Doppler were utilized during            procedure). Indications:     Dyspnea R06.00  History:         Patient has no prior history of Echocardiogram examinations.  Sonographer:     Thedora Louder RDCS, FASE Referring Phys:  [id number] CHARLIE PATTERSON Diagnosing Phys: Denyse Bathe IMPRESSIONS  1. Left ventricular ejection fraction, by estimation, is 55 to 60%. The left ventricle has normal function. The left ventricle has no regional wall motion abnormalities. Left ventricular diastolic parameters were normal.  2. Right ventricular systolic function is normal. The right ventricular size is normal.  3. The mitral valve is normal in structure. No evidence of mitral valve regurgitation. No evidence of mitral stenosis.  4. The aortic valve is normal in structure. Aortic valve regurgitation is not visualized. No aortic stenosis is present.  5. The inferior vena cava is normal in size with greater than 50% respiratory variability, suggesting right atrial pressure of 3 mmHg. FINDINGS  Left Ventricle: Left ventricular ejection fraction, by estimation, is 55 to 60%. The left ventricle has normal function. The left ventricle has no regional wall motion abnormalities. Strain was performed and the  global longitudinal strain is indeterminate. The left ventricular internal cavity size was normal in size. There is no left ventricular hypertrophy. Left ventricular diastolic parameters were normal. Right Ventricle: The right ventricular size is normal. No increase in right ventricular wall thickness. Right ventricular systolic function is normal. Left Atrium: Left atrial size was normal in size. Right Atrium: Right atrial size was normal in size. Pericardium: There is no evidence of pericardial effusion. Mitral Valve: The mitral valve is normal in structure. No evidence of mitral valve regurgitation. No evidence of mitral valve stenosis. Tricuspid Valve: The tricuspid valve is normal in structure. Tricuspid valve regurgitation is trivial. No evidence of tricuspid stenosis. Aortic Valve: The aortic valve is normal in structure. Aortic valve regurgitation is not visualized. No aortic stenosis is present. Aortic valve peak gradient measures 7.1 mmHg. Pulmonic Valve: The pulmonic valve was normal in structure. Pulmonic valve regurgitation is trivial. No evidence of pulmonic stenosis. Aorta: The aortic root is normal in size and structure. Venous: The inferior vena cava is normal in size with greater than 50% respiratory variability, suggesting right atrial pressure of 3 mmHg. IAS/Shunts: No atrial level shunt detected by color flow Doppler. Additional Comments: 3D was performed not requiring image post processing on an independent workstation and was indeterminate.  LEFT VENTRICLE PLAX 2D LVIDd:         3.60 cm   Diastology LVIDs:         2.50 cm   LV e' medial:    8.92 cm/s LV PW:         1.10 cm   LV E/e' medial:  7.2 LV IVS:        1.00 cm   LV e' lateral:   14.60 cm/s LVOT diam:     1.80 cm   LV E/e' lateral: 4.4 LV SV:         38 LV SV Index:   19 LVOT Area:     2.54 cm  RIGHT VENTRICLE RV Basal diam:  2.50 cm RV S prime:     17.80 cm/s TAPSE (M-mode): 2.6 cm LEFT ATRIUM           Index  RIGHT ATRIUM            Index LA diam:      3.60 cm 1.80 cm/m   RA Area:     12.20 cm LA Vol (A2C): 26.3 ml 13.15 ml/m  RA Volume:   29.50 ml  14.75 ml/m LA Vol (A4C): 10.1 ml 5.05 ml/m  AORTIC VALVE                 PULMONIC VALVE AV Area (Vmax): 1.68 cm     PV Vmax:        0.98 m/s AV Vmax:        133.00 cm/s  PV Peak grad:   3.8 mmHg AV Peak Grad:   7.1 mmHg     RVOT Peak grad: 4 mmHg LVOT Vmax:      87.80 cm/s LVOT Vmean:     59.500 cm/s LVOT VTI:       0.149 m  AORTA Ao Root diam: 2.90 cm Ao Asc diam:  2.70 cm MITRAL VALVE MV Area (PHT): 4.93 cm    SHUNTS MV Decel Time: 154 msec    Systemic VTI:  0.15 m MV E velocity: 63.90 cm/s  Systemic Diam: 1.80 cm MV A velocity: 66.00 cm/s MV E/A ratio:  0.97 Shaukat Khan Electronically signed by Denyse Bathe Signature Date/Time: 08/11/2024/3:32:54 PM    Final    DG Chest Port 1 View Result Date: 08/09/2024 EXAM: 1 VIEW(S) XRAY OF THE CHEST 08/09/2024 04:47:00 PM COMPARISON: None available. CLINICAL HISTORY: Fever. FINDINGS: LUNGS AND PLEURA: Calm inspiration. Linear infiltrate or atelectasis in the left lung base. This could represent compressive atelectasis or focal pneumonia. The right lung is clear. No pleural effusion. No pneumothorax. HEART AND MEDIASTINUM: Heart size and pulmonary vascularity are normal. Mediastinal contours appear intact. BONES AND SOFT TISSUES: Postoperative changes in the cervical spine. IMPRESSION: 1. Linear opacity at the left lung base, which may represent atelectasis or focal pneumonia. Electronically signed by: Elsie Gravely MD 08/09/2024 09:26 PM EST RP Workstation: [id number]   US  Paracentesis Result Date: 08/07/2024 INDICATION: 58 year old female with a history of NASH who presented to the ED on 08/04/2024 with abdominal pain and several weeks of intermittent diarrhea and bloating. Noted to have new onset ascites. Request for diagnostic and therapeutic paracentesis. EXAM: ULTRASOUND GUIDED DIAGNOSTIC AND THERAPEUTIC, LEFT-SIDED PARACENTESIS  MEDICATIONS: 1% lidocaine , 10 mL. COMPLICATIONS: None immediate. PROCEDURE: Informed written consent was obtained from the patient after a discussion of the risks, benefits and alternatives to treatment. A timeout was performed prior to the initiation of the procedure. Initial ultrasound scanning demonstrates a large amount of ascites within the left lower abdominal quadrant. The left lower abdomen was prepped and draped in the usual sterile fashion. 1% lidocaine  was used for local anesthesia. Following this, a 19 gauge, 10-cm, Yueh catheter was introduced. An ultrasound image was saved for documentation purposes. The paracentesis was performed. The catheter was removed and a dressing was applied. The patient tolerated the procedure well without immediate post procedural complication. FINDINGS: A total of approximately 1.8 L of amber fluid was removed. Samples were sent to the laboratory as requested by the clinical team. IMPRESSION: Successful ultrasound-guided paracentesis yielding 1.8 liters of peritoneal fluid. Procedure performed by: Sherrilee Bal, PA-C under the supervision of Dr. KANDICE Banner Electronically Signed   By: Cordella Banner   On: 08/07/2024 13:37   US  ASCITES (ABDOMEN LIMITED) Result Date: 08/04/2024 CLINICAL DATA:  Ascites. EXAM: LIMITED ABDOMEN ULTRASOUND FOR ASCITES TECHNIQUE: Limited ultrasound survey  for ascites was performed in all four abdominal quadrants. COMPARISON:  CT scan from earlier same day FINDINGS: Moderate volume ascites in all 4 quadrants. IMPRESSION: Positive for ascites. Electronically Signed   By: Camellia Candle M.D.   On: 08/04/2024 08:35   CT ABDOMEN PELVIS W CONTRAST Result Date: 08/04/2024 EXAM: CT ABDOMEN AND PELVIS WITH CONTRAST 08/04/2024 02:14:44 AM TECHNIQUE: CT of the abdomen and pelvis was performed with the administration of 100 mL of iohexol  (OMNIPAQUE ) 300 MG/ML solution. Multiplanar reformatted images are provided for review. Automated exposure control,  iterative reconstruction, and/or weight-based adjustment of the mA/kV was utilized to reduce the radiation dose to as low as reasonably achievable. COMPARISON: 10/24/2013. CLINICAL HISTORY: upper abd pain, diarrhea, HR 120, WBC 21 FINDINGS: LOWER CHEST: Lung bases are free from acute infiltrate or sizable effusion. LIVER: The liver is within normal limits. GALLBLADDER AND BILE DUCTS: The gallbladder has been surgically removed. No biliary ductal dilatation. SPLEEN: The spleen is within normal limits. PANCREAS: The pancreas is within normal limits. ADRENAL GLANDS: The adrenal glands are unremarkable. KIDNEYS, URETERS AND BLADDER: The kidneys are unremarkable. No stones in the kidneys or ureters. No hydronephrosis. The bladder is decompressed. GI AND BOWEL: Thickening of the distal esophagus is noted, likely related to reflux. This is stable from the prior exam. The stomach is unremarkable. Distal small bowel shows some wall thickening .this is similar to that seen on the prior exam from 2015 and may simply be reactive to the underlying ascites. Possibility of underlying inflammatory bowel disease deserves consideration. No fistulization is seen. No obstructive or inflammatory changes of the colon are seen. The appendix is within normal limits. PERITONEUM AND RETROPERITONEUM: Diffuse moderate ascites is noted throughout the abdomen. No free air. VASCULATURE: Aorta is normal in caliber. LYMPH NODES: No lymphadenopathy. REPRODUCTIVE ORGANS: The uterus is within normal limits. No definitive ovarian lesion is seen. BONES AND SOFT TISSUES: No acute osseous abnormality. No focal soft tissue abnormality. IMPRESSION: 1. Diffuse moderate ascites. This is stable from the prior CT examination but increased when compared with the prior ultrasound from 2021. 2. Distal small bowel wall thickening, similar to prior exam, possibly reactive to ascites; underlying inflammatory bowel disease could be considered. No fistulization. The  overall appearance is stable from the prior CT examination. Electronically signed by: Oneil Devonshire MD 08/04/2024 02:26 AM EST RP Workstation: [id number]    Microbiology: Results for orders placed or performed during the hospital encounter of 08/04/24  Blood culture (routine x 2)     Status: None   Collection Time: 08/04/24  3:56 AM   Specimen: BLOOD  Result Value Ref Range Status   Specimen Description BLOOD BLOOD LEFT HAND  Final   Special Requests   Final    BOTTLES DRAWN AEROBIC AND ANAEROBIC Blood Culture results may not be optimal due to an inadequate volume of blood received in culture bottles   Culture   Final    NO GROWTH 5 DAYS Performed at [facility], [address] [place], [place] [address]    Report Status 08/09/2024 FINAL  Final  Blood culture (routine x 2)     Status: None   Collection Time: 08/04/24  4:06 AM   Specimen: BLOOD  Result Value Ref Range Status   Specimen Description BLOOD BLOOD RIGHT HAND  Final   Special Requests   Final    BOTTLES DRAWN AEROBIC AND ANAEROBIC Blood Culture results may not be optimal due to an inadequate volume of blood received in  culture bottles   Culture   Final    NO GROWTH 5 DAYS Performed at [facility], [address] [place], [place] [address]    Report Status 08/09/2024 FINAL  Final  C Difficile Quick Screen w PCR reflex     Status: None   Collection Time: 08/04/24  4:36 AM   Specimen: STOOL  Result Value Ref Range Status   C Diff antigen NEGATIVE NEGATIVE Final   C Diff toxin NEGATIVE NEGATIVE Final   C Diff interpretation No C. difficile detected.  Final    Comment: Performed at [facility], [address] [place], [place] [address]  Gastrointestinal Panel by PCR , Stool     Status: None   Collection Time: 08/04/24  4:36 AM   Specimen: STOOL  Result Value Ref Range Status   Campylobacter species NOT DETECTED NOT DETECTED Final   Plesimonas shigelloides NOT DETECTED NOT DETECTED Final    Salmonella species NOT DETECTED NOT DETECTED Final   Yersinia enterocolitica NOT DETECTED NOT DETECTED Final   Vibrio species NOT DETECTED NOT DETECTED Final   Vibrio cholerae NOT DETECTED NOT DETECTED Final   Enteroaggregative E coli (EAEC) NOT DETECTED NOT DETECTED Final   Enteropathogenic E coli (EPEC) NOT DETECTED NOT DETECTED Final   Enterotoxigenic E coli (ETEC) NOT DETECTED NOT DETECTED Final   Shiga like toxin producing E coli (STEC) NOT DETECTED NOT DETECTED Final   Shigella/Enteroinvasive E coli (EIEC) NOT DETECTED NOT DETECTED Final   Cryptosporidium NOT DETECTED NOT DETECTED Final   Cyclospora cayetanensis NOT DETECTED NOT DETECTED Final   Entamoeba histolytica NOT DETECTED NOT DETECTED Final   Giardia lamblia NOT DETECTED NOT DETECTED Final   Adenovirus F40/41 NOT DETECTED NOT DETECTED Final   Astrovirus NOT DETECTED NOT DETECTED Final   Norovirus GI/GII NOT DETECTED NOT DETECTED Final   Rotavirus A NOT DETECTED NOT DETECTED Final   Sapovirus (I, II, IV, and V) NOT DETECTED NOT DETECTED Final    Comment: Performed at [facility], [address] [place], [place] [address]  Body fluid culture w Gram Stain     Status: None   Collection Time: 08/07/24 11:29 AM   Specimen: PATH Cytology Peritoneal fluid  Result Value Ref Range Status   Specimen Description   Final    PERITONEAL Performed at [facility], [address] [place], [place] [address]    Special Requests   Final    NONE Performed at [facility], [address] [place], [place] [address]    Gram Stain   Final    FEW WBC PRESENT, PREDOMINANTLY PMN NO ORGANISMS SEEN    Culture   Final    NO GROWTH 3 DAYS Performed at [facility], [address] [place], [place] [address]    Report Status 08/10/2024 FINAL  Final  Acid Fast Smear (AFB)     Status: None   Collection Time: 08/07/24 11:29 AM   Specimen: Peritoneal Dialysis; Peritoneal Fluid  Result Value Ref Range  Status   AFB Specimen Processing Concentration  Final   Acid Fast Smear Negative  Final    Comment: (NOTE) Performed At: [facility] [address] [place], [place] [id number] Jennette Shorter MD Ey:[id number]    Source (AFB) PERITONEAL  Final    Comment: Performed at [facility], [address] [place], [place] [address]  Giardia, EIA; Ova/Parasite     Status: None   Collection Time: 08/08/24  9:47 AM  Specimen: Stool  Result Value Ref Range Status   Ova + Parasite Exam Final report  Final    Comment: (NOTE) These results were obtained using wet preparation(s) and trichrome stained smear. This test does not include testing for Cryptosporidium parvum, Cyclospora, or Microsporidia.    Giardia Ag, Stl Negative Negative Final    Comment: (NOTE) Performed At: [facility] [address] [place], [place] [id number] Jennette Shorter MD Ey:[id number] Performed At: [facility] [address] [place], [place] [id number] Cherilyn Pipe T MD Ph:[phone]   Culture, blood (Routine X 2) w Reflex to ID Panel     Status: None   Collection Time: 08/09/24  5:22 PM   Specimen: BLOOD  Result Value Ref Range Status   Specimen Description BLOOD BLOOD RIGHT ARM  Final   Special Requests   Final    BOTTLES DRAWN AEROBIC AND ANAEROBIC Blood Culture adequate volume   Culture   Final    NO GROWTH 5 DAYS Performed at [facility], [address] [place], [place] [address]    Report Status 08/14/2024 FINAL  Final  Culture, blood (Routine X 2) w Reflex to ID Panel     Status: None   Collection Time: 08/09/24  5:28 PM   Specimen: BLOOD  Result Value Ref Range Status   Specimen Description BLOOD BLOOD LEFT ARM  Final   Special Requests   Final    BOTTLES DRAWN AEROBIC AND ANAEROBIC Blood Culture adequate volume   Culture   Final    NO GROWTH 5 DAYS Performed at [facility], [address] [place], [place] [address]    Report  Status 08/14/2024 FINAL  Final  Acid Fast Smear (AFB)     Status: None   Collection Time: 08/12/24  1:14 PM   Specimen: PATH Cytology Peritoneal fluid  Result Value Ref Range Status   AFB Specimen Processing Concentration  Final   Acid Fast Smear Negative  Final    Comment: (NOTE) Performed At: [facility] [address] [place], [place] [id number] Jennette Shorter MD Ey:[id number]    Source (AFB) PERITONEAL  Final    Comment: Performed at [facility], [address] [place], [place] [address]  Body fluid culture w Gram Stain     Status: None (Preliminary result)   Collection Time: 08/12/24  1:14 PM   Specimen: PATH Cytology Peritoneal fluid  Result Value Ref Range Status   Specimen Description   Final    PERITONEAL Performed at [facility], [address] [place], [place] [address]    Special Requests   Final    PERITONEAL Performed at [facility], [address] [place], [place] [address]    Gram Stain   Final    RARE WBC PRESENT, PREDOMINANTLY PMN NO ORGANISMS SEEN    Culture   Final    NO GROWTH 2 DAYS Performed at [facility], [address], [place], [place] [address]    Report Status PENDING  Incomplete    Labs: CBC: Recent Labs  Lab 08/09/24 0525 08/10/24 0608 08/12/24 0602 08/15/24 0537  WBC 17.2* 17.9* 16.4* 15.3*  NEUTROABS  --  3.8 3.8  --   HGB 13.1 13.4 13.4 13.1  HCT 41.2 41.7 40.9 41.6  MCV 88.8 88.5 86.8 89.8  PLT 189 205 198 195   Basic Metabolic Panel: Recent Labs  Lab 08/09/24 0525 08/10/24 0608 08/12/24 0602 08/13/24 0615 08/15/24 0537  NA 140 140 140 139  142  K 3.2* 3.8 3.3* 3.7 3.6  CL 104 101 100 101 103  CO2 28 32 29 30 29   GLUCOSE 114* 105* 104* 90 113*  BUN 7 7 8 10 8   CREATININE 0.54 0.52 0.45 0.49 0.61  CALCIUM  8.0* 8.5* 8.3* 8.4* 8.4*  MG  --  1.9  --  2.0 1.9  PHOS  --   --   --   --  4.2   Liver Function Tests: Recent Labs  Lab 08/13/24 0615  ALBUMIN 3.5    CBG: Recent Labs  Lab 08/14/24 0810 08/14/24 1208 08/14/24 1529 08/14/24 2107 08/15/24 0725  GLUCAP 103* 152* 109* 102* 128*    Discharge time spent: 35 minutes.  Signed: Murvin Mana, MD [facility] 08/15/2024 "

## 2024-08-16 LAB — BCR-ABL1 FISH
Cells Analyzed: 200
Cells Counted: 200

## 2024-08-17 LAB — OVA + PARASITE EXAM

## 2024-08-17 LAB — HISTOPLASMA ANTIGEN, URINE: Histoplasma Antigen, urine: NEGATIVE

## 2024-08-17 LAB — O&P RESULT

## 2024-08-21 LAB — MISC LABCORP TEST (SEND OUT): Labcorp test code: 164798

## 2024-08-23 LAB — MISC LABCORP TEST (SEND OUT): Labcorp test code: 3006066

## 2024-08-25 LAB — T CELL PANEL (GAMMA, BETA)

## 2024-08-26 LAB — MYELOID NGS

## 2024-09-03 ENCOUNTER — Other Ambulatory Visit: Payer: Self-pay

## 2024-09-03 DIAGNOSIS — E876 Hypokalemia: Secondary | ICD-10-CM

## 2024-09-04 ENCOUNTER — Inpatient Hospital Stay: Admitting: Oncology

## 2024-09-04 ENCOUNTER — Telehealth: Payer: Self-pay

## 2024-09-04 ENCOUNTER — Inpatient Hospital Stay: Attending: Oncology

## 2024-09-04 ENCOUNTER — Encounter: Payer: Self-pay | Admitting: Oncology

## 2024-09-04 ENCOUNTER — Other Ambulatory Visit: Payer: Self-pay

## 2024-09-04 VITALS — BP 105/69 | HR 94 | Temp 97.5°F | Resp 19 | Ht 61.0 in | Wt 206.9 lb

## 2024-09-04 DIAGNOSIS — D721 Eosinophilia, unspecified: Secondary | ICD-10-CM

## 2024-09-04 DIAGNOSIS — E876 Hypokalemia: Secondary | ICD-10-CM

## 2024-09-04 LAB — CBC WITH DIFFERENTIAL (CANCER CENTER ONLY)
Abs Immature Granulocytes: 0.04 K/uL (ref 0.00–0.07)
Basophils Absolute: 0.1 K/uL (ref 0.0–0.1)
Basophils Relative: 1 %
Eosinophils Absolute: 10.2 K/uL — ABNORMAL HIGH (ref 0.0–0.5)
Eosinophils Relative: 56 %
HCT: 43.2 % (ref 36.0–46.0)
Hemoglobin: 14.1 g/dL (ref 12.0–15.0)
Immature Granulocytes: 0 %
Lymphocytes Relative: 17 %
Lymphs Abs: 3 K/uL (ref 0.7–4.0)
MCH: 28.6 pg (ref 26.0–34.0)
MCHC: 32.6 g/dL (ref 30.0–36.0)
MCV: 87.6 fL (ref 80.0–100.0)
Monocytes Absolute: 0.4 K/uL (ref 0.1–1.0)
Monocytes Relative: 3 %
Neutro Abs: 4.1 K/uL (ref 1.7–7.7)
Neutrophils Relative %: 23 %
Platelet Count: 195 K/uL (ref 150–400)
RBC: 4.93 MIL/uL (ref 3.87–5.11)
RDW: 12.5 % (ref 11.5–15.5)
WBC Count: 17.9 K/uL — ABNORMAL HIGH (ref 4.0–10.5)
nRBC: 0 % (ref 0.0–0.2)

## 2024-09-04 NOTE — Telephone Encounter (Signed)
 Incoming call from patient and informed of below.

## 2024-09-04 NOTE — Progress Notes (Signed)
 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  She had visited [facility] in February 2025.     On admission patient underwent CT abdomen pelvis with contrast which showed diffuse moderate ascites.  Distal small bowel thickening, no evidence of lymphadenopathy or splenomegaly.  Patient underwent paracentesis.  Cytology negative for malignancy.SABRA  aFP negative.  Body fluid cell count was showing elevated white blood cells of 8591 with 84% eosinophils.  Ascitic fluid protein is still pending.  Cultures negative.   Systemic workup so far shows peripheral blood leukocytosis with a white cell count which has remained around 17 with peripheral eosinophilia with an absolute eosinophil count of 9.4.  proBNP normal.  IgE elevated at 768.  ANA negative.  ANCA negative.  ESR and CRP are normal.  IgG Strongyloides antibody testing negative.  Stool ova and parasites currently pending.  Pathology report smear review shows absolute eosinophilia but no other abnormal findings.  Chest x-ray did not show any infiltrates.  Urinalysis showed  trace protein 30.  B12 normal at 409.  Iron studies normal.  CMP normal.  CBC does not show any evidence of other cytopenias.   Patient underwent EGD and colonoscopy by Dr. Jinny.  EGD was normal and  duodenal biopsies showed chronic peptic duodenitis.    Colonic biopsies also did not show any evidence of granuloma or dysplasia or malignancy.  Hematology workup showed normal Tryptase level.  Peripheral flow cytometry was unremarkable.  T-cell gene rearrangement assay showed clonal T-cell receptor gamma population.  BCR-ABL FISH testing negative.  Smear review showed absolute eosinophilia.  Peripheral blood NGS did not show any clinically significant variants.  LDH mildly elevated at 257.  Patient was treated empirically with albendazole .  Testing for ova and parasites so far have been negative  Interval history-  The patient, with persistent ascites and marked eosinophilia, presents for evaluation of ongoing abdominal symptoms and abnormal blood counts.  She continues to experience recurrent ascites, initially identified during a prior hospitalization, with peritoneal fluid analysis demonstrating eosinophilic predominance. Peripheral blood studies have consistently shown leukocytosis and eosinophilia. Extensive evaluation for parasitic infection, including multiple tests and empiric albendazole  therapy, has been negative. Serologic testing for autoimmune and rheumatologic etiologies is negative.  She reports persistent watery diarrhea and abdominal discomfort. She denies severe pain as previously experienced during hospitalization, but gastrointestinal symptoms remain unresolved. Endoscopic and colonoscopic biopsies performed in May 2025 did not demonstrate eosinophilic infiltration or other abnormalities.  She has received multiple cortisone injections, with approximately five in 2025 and the most recent in October 2025 for neck pain. She recalls a similar episode of fluid accumulation approximately ten years ago following frequent cortisone injections for plantar fasciitis, which was associated with swelling. She describes herself as allergic to cortisone shots. She has also been on Ozempic without  weight loss and reports a history of gastroparesis coinciding with the onset  of ascites.  Serum IgE levels are elevated. She has undergone allergy testing on three occasions in the past. Her son was present during the encounter and provided additional historical details regarding her prior workup and treatments.      History of Present Illness     ECOG PS- 1 Pain scale- 2   Review of systems- Review of Systems  Constitutional:  Negative for chills, fever, malaise/fatigue and weight loss.  HENT:  Negative for congestion, ear discharge and nosebleeds.   Eyes:  Negative for blurred vision.  Respiratory:  Negative for cough, hemoptysis, sputum production, shortness of breath and wheezing.   Cardiovascular:  Negative for chest pain, palpitations, orthopnea and claudication.  Gastrointestinal:  Positive for diarrhea. Negative for abdominal pain, blood in stool, constipation, heartburn, melena, nausea and vomiting.  Genitourinary:  Negative for dysuria, flank pain, frequency, hematuria and urgency.  Musculoskeletal:  Negative for back pain, joint pain and myalgias.  Skin:  Negative for rash.  Neurological:  Negative for dizziness, tingling, focal weakness, seizures, weakness and headaches.  Endo/Heme/Allergies:  Does not bruise/bleed easily.  Psychiatric/Behavioral:  Negative for depression and suicidal ideas. The patient does not have insomnia.       Allergies[1]   Past Medical History:  Diagnosis Date   Allergic rhinitis    Angio-edema    Cervical radiculopathy    Chronic sinusitis    COVID-19    Elevated liver enzymes    Fatty liver disease, nonalcoholic    GERD (gastroesophageal reflux disease)    Peroneal tendonitis, left    Plantar fasciitis    Situational depression    Trigger finger of left  hand    Type 2 diabetes mellitus (HCC)    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      Past Surgical History:  Procedure Laterality Date   ANTERIOR CERVICAL DECOMP/DISCECTOMY FUSION  N/A 04/25/2023   Procedure: C6-7 ANTERIOR CERVICAL DISCECTOMY AND FUSION (GLOBUS FORGE);  Surgeon: Claudene Penne ORN, MD;  Location: [facility] ORS;  Service: Neurosurgery;  Laterality: N/A;   BREAST EXCISIONAL BIOPSY Left 2004-2005   benign   BREAST SURGERY     CESAREAN SECTION  2006   CHOLECYSTECTOMY     COLONOSCOPY N/A 12/28/2023   Procedure: COLONOSCOPY;  Surgeon: Onita Elspeth Sharper, DO;  Location: [facility] ENDOSCOPY;  Service: Gastroenterology;  Laterality: N/A;  DM   COLONOSCOPY N/A 08/06/2024   Procedure: COLONOSCOPY;  Surgeon: Jinny Carmine, MD;  Location: [facility] ENDOSCOPY;  Service: Endoscopy;  Laterality: N/A;   ESOPHAGOGASTRODUODENOSCOPY N/A 08/06/2024   Procedure: EGD (ESOPHAGOGASTRODUODENOSCOPY);  Surgeon: Jinny Carmine, MD;  Location: [facility] ENDOSCOPY;  Service: Endoscopy;  Laterality: N/A;   ETHMOIDECTOMY Left 05/24/2022   Procedure: ETHMOIDECTOMY- TOTAL WITH FRONTAL SINUS EXPLORATION;  Surgeon: Blair Mt, MD;  Location: [facility] SURGERY CNTR;  Service: ENT;  Laterality: Left;  Diabetic   GALLBLADDER SURGERY     IMAGE GUIDED SINUS SURGERY Left 05/24/2022   Procedure: IMAGE GUIDED SINUS SURGERY;  Surgeon: Blair Mt, MD;  Location: [facility] SURGERY CNTR;  Service: ENT;  Laterality: Left;  PLACED DISK ON OR CHARGE NURSE DESK 8-23 KP   MAXILLARY ANTROSTOMY Left 05/24/2022   Procedure: MAXILLARY ANTROSTOMY WITH TISSUE REMOVAL;  Surgeon: Blair Mt, MD;  Location: [facility] SURGERY CNTR;  Service: ENT;  Laterality: Left;   POLYPECTOMY  12/28/2023   Procedure: POLYPECTOMY, INTESTINE;  Surgeon: Onita Elspeth Sharper, DO;  Location: [facility] ENDOSCOPY;  Service: Gastroenterology;;    Social History   Socioeconomic History   Marital status: Married  Spouse name: Scottie   Number of children: 2   Years of education: Not on file   Highest education level: Not on file  Occupational History   Not on file  Tobacco Use   Smoking status: Never   Smokeless tobacco: Never  Vaping Use   Vaping status:  Never Used  Substance and Sexual Activity   Alcohol use: No   Drug use: No   Sexual activity: Not Currently  Other Topics Concern   Not on file  Social History Narrative   Not on file   Social Drivers of Health   Tobacco Use: Low Risk (09/04/2024)   Patient History    Smoking Tobacco Use: Never    Smokeless Tobacco Use: Never    Passive Exposure: Not on file  Financial Resource Strain: High Risk (03/01/2024)   Received from [facility]   Overall Financial Resource Strain (CARDIA)    Difficulty of Paying Living Expenses: Hard  Food Insecurity: No Food Insecurity (09/04/2024)   Epic    Worried About Radiation Protection Practitioner of Food in the Last Year: Never true    Ran Out of Food in the Last Year: Never true  Transportation Needs: No Transportation Needs (09/04/2024)   Epic    Lack of Transportation (Medical): No    Lack of Transportation (Non-Medical): No  Physical Activity: Inactive (11/08/2023)   Received from [facility]   Exercise Vital Sign    On average, how many days per week do you engage in moderate to strenuous exercise (like a brisk walk)?: 0 days    On average, how many minutes do you engage in exercise at this level?: 0 min  Stress: No Stress Concern Present (11/08/2023)   Received from [facility] - Occupational Stress Questionnaire    Feeling of Stress : Not at all  Social Connections: Socially Isolated (11/08/2023)   Received from [facility]   Social Connection and Isolation Panel    In a typical week, how many times do you talk on the phone with family, friends, or neighbors?: More than three times a week    How often do you get together with friends or relatives?: More than three times a week    How often do you attend church or religious services?: Never    Do you belong to any clubs or organizations such as church groups, unions, fraternal or athletic groups,  or school groups?: No    How often do you attend meetings of the clubs or organizations you belong to?: Never    Are you married, widowed, divorced, separated, never married, or living with a partner?: Separated  Intimate Partner Violence: Not At Risk (09/04/2024)   Epic    Fear of Current or Ex-Partner: No    Emotionally Abused: No    Physically Abused: No    Sexually Abused: No  Depression (PHQ2-9): Low Risk (09/04/2024)   Depression (PHQ2-9)    PHQ-2 Score: 0  Alcohol Screen: Not on file  Housing: Low Risk (09/04/2024)   Epic    Unable to Pay for Housing in the Last Year: No    Number of Times Moved in the Last Year: 0    Homeless in the Last Year: No  Utilities: Not At Risk (09/04/2024)   Epic    Threatened with loss of utilities: No  Health Literacy: Adequate Health Literacy (11/08/2023)   Received from [facility]  B1300 Health Literacy    Frequency of need for help with medical instructions: Never    Family History  Problem Relation Age of Onset   Breast cancer Neg Hx     Current Medications[2]  Physical exam:  Vitals:   09/04/24 0841  BP: 105/69  Pulse: 94  Resp: 19  Temp: (!) 97.5 F (36.4 C)  TempSrc: Tympanic  SpO2: 96%  Weight: 206 lb 14.4 oz (93.8 kg)  Height: 5' 1 (1.549 m)   Physical Exam Cardiovascular:     Rate and Rhythm: Normal rate and regular rhythm.     Heart sounds: Normal heart sounds.  Pulmonary:     Effort: Pulmonary effort is normal.     Breath sounds: Normal breath sounds.  Abdominal:     General: Bowel sounds are normal. There is no distension.     Palpations: Abdomen is soft.     Tenderness: There is no abdominal tenderness.  Skin:    General: Skin is warm and dry.  Neurological:     Mental Status: She is alert and oriented to person, place, and time.      I have personally reviewed labs listed below:    Latest Ref Rng & Units 08/15/2024    5:37 AM  CMP  Glucose 70 - 99 mg/dL 886   BUN 6 - 20 mg/dL 8    Creatinine 9.55 - 1.00 mg/dL 9.38   Sodium 864 - 854 mmol/L 142   Potassium 3.5 - 5.1 mmol/L 3.6   Chloride 98 - 111 mmol/L 103   CO2 22 - 32 mmol/L 29   Calcium  8.9 - 10.3 mg/dL 8.4       Latest Ref Rng & Units 09/04/2024    8:19 AM  CBC  WBC 4.0 - 10.5 K/uL 17.9   Hemoglobin 12.0 - 15.0 g/dL 85.8   Hematocrit 63.9 - 46.0 % 43.2   Platelets 150 - 400 K/uL 195    I have personally reviewed Radiology images listed below: No images are attached to the encounter.  US  Paracentesis Result Date: 08/12/2024 INDICATION: The 59 year old female with a history of NASH cirrhosis, admitted for abdominal pain/ diarrhea. Now noted with new onset ascites. IR is requested for repeat diagnostic and therapeutic paracentesis. EXAM: ULTRASOUND GUIDED DIAGNOSTIC AND THERAPEUTIC PARACENTESIS MEDICATIONS: 9 mL of 1% lidocaine . COMPLICATIONS: None immediate. PROCEDURE: Informed written consent was obtained from the patient after a discussion of the risks, benefits and alternatives to treatment. A timeout was performed prior to the initiation of the procedure. Initial ultrasound scanning demonstrates a moderate amount of ascites within the right lower abdominal quadrant. The right lower abdomen was prepped and draped in the usual sterile fashion. 1% lidocaine  was used for local anesthesia. Following this, a 19 gauge, 7-cm, Yueh catheter was introduced. An ultrasound image was saved for documentation purposes. The paracentesis was performed. The catheter was removed and a dressing was applied. The patient tolerated the procedure well without immediate post procedural complication. FINDINGS: A total of approximately 1.8 L of clear, straw-colored peritoneal fluid was removed. Samples were sent to the laboratory as requested by the clinical team. IMPRESSION: Successful ultrasound-guided paracentesis yielding 1.8 liters of peritoneal fluid. Procedure performed by Carlin Griffon, PA-C, under the direct supervision of Dr. Marcey Moan, MD Electronically Signed   By: Marcey Moan M.D.   On: 08/12/2024 13:59   ECHOCARDIOGRAM COMPLETE Result Date: 08/11/2024    ECHOCARDIOGRAM REPORT   Patient Name:   VONA WHITERS Date of  Exam: 08/11/2024 Medical Rec #:  [id number]      Height:       61.0 in Accession #:    [id number]     Weight:       229.0 lb Date of Birth:  08/18/1966       BSA:          2.001 m Patient Age:    58 years       BP:           130/89 mmHg Patient Gender: F              HR:           92 bpm. Exam Location:  [facility] Procedure: 2D Echo (Both Spectral and Color Flow Doppler were utilized during            procedure). Indications:     Dyspnea R06.00  History:         Patient has no prior history of Echocardiogram examinations.  Sonographer:     Thedora Louder RDCS, FASE Referring Phys:  [id number] CHARLIE PATTERSON Diagnosing Phys: Denyse Bathe IMPRESSIONS  1. Left ventricular ejection fraction, by estimation, is 55 to 60%. The left ventricle has normal function. The left ventricle has no regional wall motion abnormalities. Left ventricular diastolic parameters were normal.  2. Right ventricular systolic function is normal. The right ventricular size is normal.  3. The mitral valve is normal in structure. No evidence of mitral valve regurgitation. No evidence of mitral stenosis.  4. The aortic valve is normal in structure. Aortic valve regurgitation is not visualized. No aortic stenosis is present.  5. The inferior vena cava is normal in size with greater than 50% respiratory variability, suggesting right atrial pressure of 3 mmHg. FINDINGS  Left Ventricle: Left ventricular ejection fraction, by estimation, is 55 to 60%. The left ventricle has normal function. The left ventricle has no regional wall motion abnormalities. Strain was performed and the global longitudinal strain is indeterminate. The left ventricular internal cavity size was normal in size. There is no left ventricular hypertrophy. Left ventricular diastolic  parameters were normal. Right Ventricle: The right ventricular size is normal. No increase in right ventricular wall thickness. Right ventricular systolic function is normal. Left Atrium: Left atrial size was normal in size. Right Atrium: Right atrial size was normal in size. Pericardium: There is no evidence of pericardial effusion. Mitral Valve: The mitral valve is normal in structure. No evidence of mitral valve regurgitation. No evidence of mitral valve stenosis. Tricuspid Valve: The tricuspid valve is normal in structure. Tricuspid valve regurgitation is trivial. No evidence of tricuspid stenosis. Aortic Valve: The aortic valve is normal in structure. Aortic valve regurgitation is not visualized. No aortic stenosis is present. Aortic valve peak gradient measures 7.1 mmHg. Pulmonic Valve: The pulmonic valve was normal in structure. Pulmonic valve regurgitation is trivial. No evidence of pulmonic stenosis. Aorta: The aortic root is normal in size and structure. Venous: The inferior vena cava is normal in size with greater than 50% respiratory variability, suggesting right atrial pressure of 3 mmHg. IAS/Shunts: No atrial level shunt detected by color flow Doppler. Additional Comments: 3D was performed not requiring image post processing on an independent workstation and was indeterminate.  LEFT VENTRICLE PLAX 2D LVIDd:         3.60 cm   Diastology LVIDs:         2.50 cm   LV e' medial:    8.92 cm/s LV PW:  1.10 cm   LV E/e' medial:  7.2 LV IVS:        1.00 cm   LV e' lateral:   14.60 cm/s LVOT diam:     1.80 cm   LV E/e' lateral: 4.4 LV SV:         38 LV SV Index:   19 LVOT Area:     2.54 cm  RIGHT VENTRICLE RV Basal diam:  2.50 cm RV S prime:     17.80 cm/s TAPSE (M-mode): 2.6 cm LEFT ATRIUM           Index        RIGHT ATRIUM           Index LA diam:      3.60 cm 1.80 cm/m   RA Area:     12.20 cm LA Vol (A2C): 26.3 ml 13.15 ml/m  RA Volume:   29.50 ml  14.75 ml/m LA Vol (A4C): 10.1 ml 5.05 ml/m   AORTIC VALVE                 PULMONIC VALVE AV Area (Vmax): 1.68 cm     PV Vmax:        0.98 m/s AV Vmax:        133.00 cm/s  PV Peak grad:   3.8 mmHg AV Peak Grad:   7.1 mmHg     RVOT Peak grad: 4 mmHg LVOT Vmax:      87.80 cm/s LVOT Vmean:     59.500 cm/s LVOT VTI:       0.149 m  AORTA Ao Root diam: 2.90 cm Ao Asc diam:  2.70 cm MITRAL VALVE MV Area (PHT): 4.93 cm    SHUNTS MV Decel Time: 154 msec    Systemic VTI:  0.15 m MV E velocity: 63.90 cm/s  Systemic Diam: 1.80 cm MV A velocity: 66.00 cm/s MV E/A ratio:  0.97 Shaukat Khan Electronically signed by Denyse Bathe Signature Date/Time: 08/11/2024/3:32:54 PM    Final    DG Chest Port 1 View Result Date: 08/09/2024 EXAM: 1 VIEW(S) XRAY OF THE CHEST 08/09/2024 04:47:00 PM COMPARISON: None available. CLINICAL HISTORY: Fever. FINDINGS: LUNGS AND PLEURA: Calm inspiration. Linear infiltrate or atelectasis in the left lung base. This could represent compressive atelectasis or focal pneumonia. The right lung is clear. No pleural effusion. No pneumothorax. HEART AND MEDIASTINUM: Heart size and pulmonary vascularity are normal. Mediastinal contours appear intact. BONES AND SOFT TISSUES: Postoperative changes in the cervical spine. IMPRESSION: 1. Linear opacity at the left lung base, which may represent atelectasis or focal pneumonia. Electronically signed by: Elsie Gravely MD 08/09/2024 09:26 PM EST RP Workstation: [id number]   US  Paracentesis Result Date: 08/07/2024 INDICATION: 59 year old female with a history of NASH who presented to the ED on 08/04/2024 with abdominal pain and several weeks of intermittent diarrhea and bloating. Noted to have new onset ascites. Request for diagnostic and therapeutic paracentesis. EXAM: ULTRASOUND GUIDED DIAGNOSTIC AND THERAPEUTIC, LEFT-SIDED PARACENTESIS MEDICATIONS: 1% lidocaine , 10 mL. COMPLICATIONS: None immediate. PROCEDURE: Informed written consent was obtained from the patient after a discussion of the risks, benefits  and alternatives to treatment. A timeout was performed prior to the initiation of the procedure. Initial ultrasound scanning demonstrates a large amount of ascites within the left lower abdominal quadrant. The left lower abdomen was prepped and draped in the usual sterile fashion. 1% lidocaine  was used for local anesthesia. Following this, a 19 gauge, 10-cm, Yueh catheter was introduced. An ultrasound image was saved  for documentation purposes. The paracentesis was performed. The catheter was removed and a dressing was applied. The patient tolerated the procedure well without immediate post procedural complication. FINDINGS: A total of approximately 1.8 L of amber fluid was removed. Samples were sent to the laboratory as requested by the clinical team. IMPRESSION: Successful ultrasound-guided paracentesis yielding 1.8 liters of peritoneal fluid. Procedure performed by: Sherrilee Bal, PA-C under the supervision of Dr. KANDICE Banner Electronically Signed   By: Cordella Banner   On: 08/07/2024 13:37     Assessment and plan- Patient is a 59 y.o. 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 Parasitic, rheumatologic, and autoimmune causes excluded. Hematologic malignancies remain in differential. Elevated IgE suggests possible allergic or immunologic etiology. - Bone marrow biopsy to evaluate for hematologic malignancy or other marrow-based causes. -Clinically patient is somewhat better since her hospital discharge.  Abdomen is not distended on today's exam and patient appears more comfortable today.  However peripheral blood eosinophilia persists - Referral to allergy/immunology for further evaluation of allergic or immunologic etiologies.  I have personally spoken to Dr. Onita from GI and he will be referring the patient to allergy immunology at [facility] after  we have the results of bone marrow biopsy. - Follow-up in three weeks to review bone marrow biopsy results.  Ascites Recurrent ascites with eosinophil predominance. Etiology unclear after negative infectious, malignant, and autoimmune workup. Drug-induced ascites from Ozempic or corticosteroids possible but unconfirmed. - Discuss with primary gastroenterologist to review case and consider further diagnostic steps if ascites recurs or worsens. - If ascites recurs or worsens, consider further gastrointestinal evaluation, including possible deep biopsies.  Chronic diarrhea Chronic diarrhea and abdominal discomfort persist despite prior non-diagnostic gastrointestinal workup. Possible relation to eosinophilia or another undiagnosed process. - Monitor symptoms and reassess after bone marrow biopsy and allergy/immunology evaluation results. - Communicate with gastroenterologist regarding ongoing symptoms and coordinate further workup as needed.         Visit Diagnosis 1. Eosinophilia, unspecified type      Dr. Annah Skene, MD, MPH [facility] [phone] 09/04/2024 12:18 PM                   [1]  Allergies Allergen Reactions   Cortisone Other (See Comments) and Swelling    Patient experienced severe stomach pains and bloating; 05/24/22: pt has had an injection recently without issues  Liver failure  Liver failure - subacute & reversible; diagnosed after she had severe bloating & stomach pain after numerous cortisone injections over a 6 month timeframe. Has had isolated cortisone injections which were better spaced out without these difficulties & has tolerated oral steroids without difficulty in short courses as well. This reaction only occurred after the multiple injections within that 6 months.   Liver failure - subacute & reversible; diagnosed after she had severe bloating & stomach pain after numerous cortisone injections over a 6 month  timeframe. Has had isolated cortisone injections which were better spaced out without these difficulties & has tolerated oral steroids without difficulty in short courses as well. This reaction only occurred after the multiple injections within that 6 months.  [2]  Current Outpatient Medications:    dicyclomine  (BENTYL ) 10 MG capsule, Take 1 capsule (10 mg total) by mouth 4 (four) times daily as needed., Disp: 30 capsule, Rfl: 0   DULoxetine  (CYMBALTA ) 60  MG capsule, Take 60 mg by mouth daily., Disp: , Rfl:    EPINEPHrine  0.3 mg/0.3 mL IJ SOAJ injection, Inject 0.3 mg into the muscle as needed., Disp: , Rfl:    fluticasone (FLONASE) 50 MCG/ACT nasal spray, Place into both nostrils daily., Disp: , Rfl:    furosemide  (LASIX ) 40 MG tablet, Take 1 tablet (40 mg total) by mouth daily., Disp: 30 tablet, Rfl: 0   ipratropium (ATROVENT) 0.03 % nasal spray, Place 2 sprays into both nostrils every 12 (twelve) hours., Disp: , Rfl:    levocetirizine (XYZAL) 5 MG tablet, Take 5 mg by mouth every evening., Disp: , Rfl:    loperamide  (IMODIUM ) 2 MG capsule, Take 1 capsule (2 mg total) by mouth every 8 (eight) hours as needed for diarrhea or loose stools., Disp: 30 capsule, Rfl: 0   metoCLOPramide (REGLAN) 5 MG tablet, Take 5 mg by mouth., Disp: , Rfl:    omeprazole  (PRILOSEC) 20 MG capsule, Take 2 capsules (40 mg total) by mouth daily., Disp: 30 capsule, Rfl: 0   Pancrelipase , Lip-Prot-Amyl, 24000-76000 units CPEP, Take 1 capsule (24,000 Units total) by mouth 3 (three) times daily before meals., Disp: 270 capsule, Rfl: 0   rosuvastatin  (CRESTOR ) 10 MG tablet, Take 1 tablet (10 mg total) by mouth daily., Disp: 30 tablet, Rfl: 0   senna (SENOKOT) 8.6 MG TABS tablet, Take 1 tablet (8.6 mg total) by mouth daily as needed for mild constipation., Disp: 30 tablet, Rfl: 0   spironolactone  (ALDACTONE ) 100 MG tablet, Take 1 tablet (100 mg total) by mouth daily., Disp: 30 tablet, Rfl: 0   sucralfate (CARAFATE) 1 g tablet,  Take 1 g by mouth 4 (four) times daily -  with meals and at bedtime., Disp: , Rfl:    traMADol (ULTRAM) 50 MG tablet, Take by mouth., Disp: , Rfl:    triamcinolone  cream (KENALOG ) 0.1 %, Apply 1 Application topically 2 (two) times daily as needed., Disp: , Rfl:    tiZANidine  (ZANAFLEX ) 4 MG tablet, TAKE 1 TABLET BY MOUTH 3 TIMES DAILY. (Patient not taking: Reported on 09/04/2024), Disp: 90 tablet, Rfl: 0  "

## 2024-09-04 NOTE — Telephone Encounter (Signed)
 After ending conversation with patient realized next Tuesday is 09/10/24, not 1/19.  Also per Clarita patient will check in at 7:30a, procedure will start at 8:30 and will take approx less than 30 min then she will go back to [facility] were they will watch her for approx another hour. Then she will be able to go home. If patient calls back please inform of above. Will try contacting again shortly.

## 2024-09-04 NOTE — Progress Notes (Signed)
 Patient states she hasn't received a diagnosis & would like insight regarding that. She is a hospital discharge follow up.

## 2024-09-04 NOTE — Telephone Encounter (Signed)
 Patient in need of bone marrow biopsy ASAP; will add Tues 1/19 at 8:30a an arrive at 7:30a. Outbound call; patient agreed to above date / time.  Informed will need a driver, NPO after midnight, and [facility].

## 2024-09-09 ENCOUNTER — Telehealth: Payer: Self-pay

## 2024-09-09 NOTE — Telephone Encounter (Signed)
 Patient for IR Bone Marrow Biopsy on Tues 09/10/24, I called and LVM for the patient on the phone and gave pre-procedure instructions. VM made the patient aware to be here at 7:30a, NPO after MN prior to procedure as well as driver post procedure/recovery/discharge. Called 09/09/24

## 2024-09-10 ENCOUNTER — Encounter: Payer: Self-pay | Admitting: Radiology

## 2024-09-10 ENCOUNTER — Telehealth: Payer: Self-pay

## 2024-09-10 ENCOUNTER — Ambulatory Visit: Admission: RE | Admit: 2024-09-10 | Discharge: 2024-09-10 | Attending: Oncology | Admitting: Radiology

## 2024-09-10 VITALS — BP 107/67 | HR 79 | Temp 97.9°F | Resp 15

## 2024-09-10 DIAGNOSIS — D696 Thrombocytopenia, unspecified: Secondary | ICD-10-CM | POA: Diagnosis not present

## 2024-09-10 DIAGNOSIS — K7581 Nonalcoholic steatohepatitis (NASH): Secondary | ICD-10-CM | POA: Insufficient documentation

## 2024-09-10 DIAGNOSIS — Z79899 Other long term (current) drug therapy: Secondary | ICD-10-CM | POA: Insufficient documentation

## 2024-09-10 DIAGNOSIS — K7460 Unspecified cirrhosis of liver: Secondary | ICD-10-CM | POA: Diagnosis not present

## 2024-09-10 DIAGNOSIS — D721 Eosinophilia, unspecified: Secondary | ICD-10-CM | POA: Diagnosis present

## 2024-09-10 DIAGNOSIS — G4730 Sleep apnea, unspecified: Secondary | ICD-10-CM | POA: Insufficient documentation

## 2024-09-10 DIAGNOSIS — Z7985 Long-term (current) use of injectable non-insulin antidiabetic drugs: Secondary | ICD-10-CM | POA: Insufficient documentation

## 2024-09-10 DIAGNOSIS — E119 Type 2 diabetes mellitus without complications: Secondary | ICD-10-CM | POA: Diagnosis not present

## 2024-09-10 DIAGNOSIS — Z01818 Encounter for other preprocedural examination: Secondary | ICD-10-CM

## 2024-09-10 HISTORY — PX: IR BONE MARROW BIOPSY & ASPIRATION: IMG5727

## 2024-09-10 LAB — CBC WITH DIFFERENTIAL/PLATELET
Abs Immature Granulocytes: 0.01 K/uL (ref 0.00–0.07)
Basophils Absolute: 0.1 K/uL (ref 0.0–0.1)
Basophils Relative: 1 %
Eosinophils Absolute: 4.9 K/uL — ABNORMAL HIGH (ref 0.0–0.5)
Eosinophils Relative: 48 %
HCT: 41.8 % (ref 36.0–46.0)
Hemoglobin: 13.8 g/dL (ref 12.0–15.0)
Immature Granulocytes: 0 %
Lymphocytes Relative: 21 %
Lymphs Abs: 2.1 K/uL (ref 0.7–4.0)
MCH: 28.5 pg (ref 26.0–34.0)
MCHC: 33 g/dL (ref 30.0–36.0)
MCV: 86.4 fL (ref 80.0–100.0)
Monocytes Absolute: 0.4 K/uL (ref 0.1–1.0)
Monocytes Relative: 4 %
Neutro Abs: 2.7 K/uL (ref 1.7–7.7)
Neutrophils Relative %: 26 %
Platelets: 145 K/uL — ABNORMAL LOW (ref 150–400)
RBC: 4.84 MIL/uL (ref 3.87–5.11)
RDW: 12.5 % (ref 11.5–15.5)
Smear Review: NORMAL
WBC: 10.1 K/uL (ref 4.0–10.5)
nRBC: 0 % (ref 0.0–0.2)

## 2024-09-10 LAB — GLUCOSE, CAPILLARY: Glucose-Capillary: 120 mg/dL — ABNORMAL HIGH (ref 70–99)

## 2024-09-10 MED ORDER — MIDAZOLAM HCL (PF) 2 MG/2ML IJ SOLN
INTRAMUSCULAR | Status: AC | PRN
  Administered 2024-09-10: 1 mg via INTRAVENOUS

## 2024-09-10 MED ORDER — MIDAZOLAM HCL 2 MG/2ML IJ SOLN
INTRAMUSCULAR | Status: AC
  Filled 2024-09-10: qty 2

## 2024-09-10 MED ORDER — HEPARIN SOD (PORK) LOCK FLUSH 100 UNIT/ML IV SOLN
INTRAVENOUS | Status: AC
  Filled 2024-09-10: qty 5

## 2024-09-10 MED ORDER — FENTANYL CITRATE (PF) 100 MCG/2ML IJ SOLN
INTRAMUSCULAR | Status: AC
  Filled 2024-09-10: qty 2

## 2024-09-10 MED ORDER — SODIUM CHLORIDE 0.9 % IV SOLN: INTRAVENOUS | Status: DC

## 2024-09-10 MED ORDER — FENTANYL CITRATE (PF) 100 MCG/2ML IJ SOLN
INTRAMUSCULAR | Status: AC | PRN
  Administered 2024-09-10 (×2): 50 ug via INTRAVENOUS

## 2024-09-10 MED ORDER — LIDOCAINE 1 % OPTIME INJ - NO CHARGE
5.0000 mL | Freq: Once | INTRAMUSCULAR | Status: AC
  Administered 2024-09-10: 5 mL via INTRADERMAL
  Filled 2024-09-10: qty 6

## 2024-09-10 NOTE — Progress Notes (Signed)
 Patient clinically stable post IR BMB per Dr Philip, tolerated well. Vitals stable post procedure, received Versed  1 mg along with Fentanyl  100 mcg IV for procedure. Report given to [facility] RN post procedure/specials/22

## 2024-09-10 NOTE — Telephone Encounter (Signed)
 Outbound call to [facility] spoke with Nat who said Dr. Legolvan will be the pathologist reading the report. Provided Dr. Darold cell phone number and they will relay the message to him to call.

## 2024-09-10 NOTE — Procedures (Signed)
 Interventional Radiology Procedure:   Indications: Eosinophilia  Procedure: Image guided bone marrow biopsy  Findings: 2 aspirates and 2 cores from right ilium  Complications: None     EBL: Minimal, less than 10 ml  Plan: Discharge to home in one hour.   Marilynn Ekstein R. Philip, MD  Pager: [phone]

## 2024-09-10 NOTE — H&P (Signed)
 "     Chief Complaint: Patient was seen in consultation today for eosinophilia  Referring Physician(s): Rao,Archana C  Supervising Physician: Philip Cornet  Patient Status: [facility] - Out-pt  History of Present Illness: Tonya Mueller is a 59 y.o. female with a medical history significant for NASH cirrhosis, sleep apnea (CPAP), cervical radiculopathy, spinal stenosis, DM2 and obesity on Ozempic. She presented to the ED 08/04/24 with complaints of 3 weeks of upper abdominal pain with diarrhea. Work up in the ED revealed a mildly elevated ALT and alkaline phosphatase, leukocytosis and moderate ascites on CT.  CT also showed small bowel wall thickening concerning for inflammatory bowel disease. She was seen in IR 08/07/24 for a paracentesis and body fluid cell count was notable for elevated WBCs with a large percentage of eosinophils.   She was evaluated by GI and colonoscopy/EGD were negative for acute findings. Hematology and ID were consulted for possible eosinophilic disease. She underwent an extensive lab work up which was unrevealing as to the source of her eosinophilia.  She was discharged from the hospital 08/15/24 and followed up with Hematology outpatient. A hematologic malignancy remains in the differential, and a bone marrow biopsy with aspiration was recommended for further work up.   Past Medical History:  Diagnosis Date   Allergic rhinitis    Angio-edema    Cervical radiculopathy    Chronic sinusitis    COVID-19    Elevated liver enzymes    Fatty liver disease, nonalcoholic    GERD (gastroesophageal reflux disease)    Peroneal tendonitis, left    Plantar fasciitis    Situational depression    Trigger finger of left  hand    Type 2 diabetes mellitus (HCC)    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     Past Surgical History:  Procedure Laterality Date   ANTERIOR CERVICAL DECOMP/DISCECTOMY FUSION N/A 04/25/2023   Procedure: C6-7 ANTERIOR CERVICAL DISCECTOMY AND FUSION (GLOBUS FORGE);   Surgeon: Claudene Penne ORN, MD;  Location: [facility] ORS;  Service: Neurosurgery;  Laterality: N/A;   BREAST EXCISIONAL BIOPSY Left 2004-2005   benign   BREAST SURGERY     CESAREAN SECTION  2006   CHOLECYSTECTOMY     COLONOSCOPY N/A 12/28/2023   Procedure: COLONOSCOPY;  Surgeon: Onita Elspeth Sharper, DO;  Location: [facility] ENDOSCOPY;  Service: Gastroenterology;  Laterality: N/A;  DM   COLONOSCOPY N/A 08/06/2024   Procedure: COLONOSCOPY;  Surgeon: Jinny Carmine, MD;  Location: [facility] ENDOSCOPY;  Service: Endoscopy;  Laterality: N/A;   ESOPHAGOGASTRODUODENOSCOPY N/A 08/06/2024   Procedure: EGD (ESOPHAGOGASTRODUODENOSCOPY);  Surgeon: Jinny Carmine, MD;  Location[facility] ENDOSCOPY;  Service: Endoscopy;  Laterality: N/A;   ETHMOIDECTOMY Left 05/24/2022   Procedure: ETHMOIDECTOMY- TOTAL WITH FRONTAL SINUS EXPLORATION;  Surgeon: Blair Mt, MD;  Location: [facility];  Service: ENT;  Laterality: Left;  Diabetic   GALLBLADDER SURGERY     IMAGE GUIDED SINUS SURGERY Left 05/24/2022   Procedure: IMAGE GUIDED SINUS SURGERY;  Surgeon: Blair Mt, MD;  Location: [facility];  Service: ENT;  Laterality: Left;  PLACED DISK ON OR CHARGE NURSE DESK 8-23 KP   MAXILLARY ANTROSTOMY Left 05/24/2022   Procedure: MAXILLARY ANTROSTOMY WITH TISSUE REMOVAL;  Surgeon: Blair Mt, MD;  Location: [facility]ERY [facility];  Service: ENT;  Laterality: Left;   POLYPECTOMY  12/28/2023   Procedure: POLYPECTOMY, INTESTINE;  Surgeon: Onita Elspeth Sharper, DO;  Location: [facility] ENDOSCOPY;  Service: Gastroenterology;;    Allergies: Cortisone  Medications: Prior to Admission medications  Medication Sig Start  Date End Date Taking? Authorizing Provider  dicyclomine  (BENTYL ) 10 MG capsule Take 1 capsule (10 mg total) by mouth 4 (four) times daily as needed. 08/15/24   Laurita Pillion, MD  DULoxetine  (CYMBALTA ) 60 MG capsule Take 60 mg by mouth daily.    [provider]  EPINEPHrine  0.3 mg/0.3 mL IJ SOAJ injection Inject  0.3 mg into the muscle as needed. 10/20/20   [provider]  fluticasone (FLONASE) 50 MCG/ACT nasal spray Place into both nostrils daily.    [provider]  furosemide  (LASIX ) 40 MG tablet Take 1 tablet (40 mg total) by mouth daily. 08/15/24   Zhang, Dekui, MD  ipratropium (ATROVENT) 0.03 % nasal spray Place 2 sprays into both nostrils every 12 (twelve) hours.    [provider]  levocetirizine (XYZAL) 5 MG tablet Take 5 mg by mouth every evening.    [provider]  loperamide  (IMODIUM ) 2 MG capsule Take 1 capsule (2 mg total) by mouth every 8 (eight) hours as needed for diarrhea or loose stools. 08/15/24   Laurita Pillion, MD  metoCLOPramide (REGLAN) 5 MG tablet Take 5 mg by mouth. 08/23/24   [provider]  omeprazole  (PRILOSEC) 20 MG capsule Take 2 capsules (40 mg total) by mouth daily. 08/15/24   Laurita Pillion, MD  Pancrelipase , Lip-Prot-Amyl, 24000-76000 units CPEP Take 1 capsule (24,000 Units total) by mouth 3 (three) times daily before meals. 08/15/24   Laurita Pillion, MD  rosuvastatin  (CRESTOR ) 10 MG tablet Take 1 tablet (10 mg total) by mouth daily. 08/15/24   Laurita Pillion, MD  senna (SENOKOT) 8.6 MG TABS tablet Take 1 tablet (8.6 mg total) by mouth daily as needed for mild constipation. 04/26/23   Gregory Edsel Ruth, PA  spironolactone  (ALDACTONE ) 100 MG tablet Take 1 tablet (100 mg total) by mouth daily. 08/15/24   Zhang, Dekui, MD  sucralfate (CARAFATE) 1 g tablet Take 1 g by mouth 4 (four) times daily -  with meals and at bedtime. 08/02/24 08/02/25  [provider]  tiZANidine  (ZANAFLEX ) 4 MG tablet TAKE 1 TABLET BY MOUTH 3 TIMES DAILY. Patient not taking: Reported on 09/04/2024 05/22/23   Gregory Edsel Ruth, PA  traMADol (ULTRAM) 50 MG tablet Take by mouth.    [provider]  triamcinolone  cream (KENALOG ) 0.1 % Apply 1 Application topically 2 (two) times daily as needed. 11/10/22   [provider]     Family History   Problem Relation Age of Onset   Breast cancer Neg Hx     Social History   Socioeconomic History   Marital status: Married    Spouse name: Scottie   Number of children: 2   Years of education: Not on file   Highest education level: Not on file  Occupational History   Not on file  Tobacco Use   Smoking status: Never   Smokeless tobacco: Never  Vaping Use   Vaping status: Never Used  Substance and Sexual Activity   Alcohol use: No   Drug use: No   Sexual activity: Not Currently  Other Topics Concern   Not on file  Social History Narrative   Not on file   Social Drivers of Health   Tobacco Use: Low Risk (09/10/2024)   Patient History    Smoking Tobacco Use: Never    Smokeless Tobacco Use: Never    Passive Exposure: Not on file  Financial Resource Strain: High Risk (03/01/2024)   Received from [facility]   Overall Financial  Resource Strain (CARDIA)    Difficulty of Paying Living Expenses: Hard  Food Insecurity: No Food Insecurity (09/04/2024)   Epic    Worried About Programme Researcher, Broadcasting/film/video in the Last Year: Never true    Ran Out of Food in the Last Year: Never true  Transportation Needs: No Transportation Needs (09/04/2024)   Epic    Lack of Transportation (Medical): No    Lack of Transportation (Non-Medical): No  Physical Activity: Inactive (11/08/2023)   Received from [facility]   Exercise Vital Sign    On average, how many days per week do you engage in moderate to strenuous exercise (like a brisk walk)?: 0 days    On average, how many minutes do you engage in exercise at this level?: 0 min  Stress: No Stress Concern Present (11/08/2023)   Received from [facility] - Occupational Stress Questionnaire    Feeling of Stress : Not at all  Social Connections: Socially Isolated (11/08/2023)   Received from [facility]   Social Connection and Isolation Panel     In a typical week, how many times do you talk on the phone with family, friends, or neighbors?: More than three times a week    How often do you get together with friends or relatives?: More than three times a week    How often do you attend church or religious services?: Never    Do you belong to any clubs or organizations such as church groups, unions, fraternal or athletic groups, or school groups?: No    How often do you attend meetings of the clubs or organizations you belong to?: Never    Are you married, widowed, divorced, separated, never married, or living with a partner?: Separated  Depression (PHQ2-9): Low Risk (09/04/2024)   Depression (PHQ2-9)    PHQ-2 Score: 0  Alcohol Screen: Not on file  Housing: Low Risk (09/04/2024)   Epic    Unable to Pay for Housing in the Last Year: No    Number of Times Moved in the Last Year: 0    Homeless in the Last Year: No  Utilities: Not At Risk (09/04/2024)   Epic    Threatened with loss of utilities: No  Health Literacy: Adequate Health Literacy (11/08/2023)   Received from [facility]   B1300 Health Literacy    Frequency of need for help with medical instructions: Never    Review of Systems: A 12 point ROS discussed and pertinent positives are indicated in the HPI above.  All other systems are negative.  Review of Systems  All other systems reviewed and are negative.   Vital Signs: BP 132/72   Pulse 84   Temp (!) 97 F (36.1 C) (Temporal)   Resp 12   LMP 05/13/2013   SpO2 96%   Physical Exam Constitutional:      General: She is not in acute distress.    Appearance: She is obese. She is not ill-appearing.  HENT:     Mouth/Throat:     Mouth: Mucous membranes are moist.     Pharynx: Oropharynx is clear.  Cardiovascular:     Rate and Rhythm: Normal rate.  Pulmonary:     Effort: Pulmonary effort is normal.  Abdominal:     General: There is no distension.     Tenderness: There is no abdominal tenderness.   Skin:    General: Skin is warm  and dry.  Neurological:     Mental Status: She is alert and oriented to person, place, and time.      Labs:  CBC: Recent Labs    08/10/24 0608 08/12/24 0602 08/15/24 0537 09/04/24 0819  WBC 17.9* 16.4* 15.3* 17.9*  HGB 13.4 13.4 13.1 14.1  HCT 41.7 40.9 41.6 43.2  PLT 205 198 195 195    COAGS: No results for input(s): INR, APTT in the last 8760 hours.  BMP: Recent Labs    08/10/24 0608 08/12/24 0602 08/13/24 0615 08/15/24 0537  NA 140 140 139 142  K 3.8 3.3* 3.7 3.6  CL 101 100 101 103  CO2 32 29 30 29   GLUCOSE 105* 104* 90 113*  BUN 7 8 10 8   CALCIUM  8.5* 8.3* 8.4* 8.4*  CREATININE 0.52 0.45 0.49 0.61  GFRNONAA >60 >60 >60 >60    LIVER FUNCTION TESTS: Recent Labs    08/04/24 0007 08/05/24 0511 08/06/24 0514 08/07/24 0543 08/13/24 0615  BILITOT 0.7 0.4 0.4 0.6  --   AST 33 22 17 28   --   ALT 64* 40 35 32  --   ALKPHOS 188* 143* 151* 138*  --   PROT 6.4* 5.2* 5.9* 5.7*  --   ALBUMIN 3.9 3.3* 3.7 3.5 3.5    TUMOR MARKERS: No results for input(s): AFPTM, CEA, CA199, CHROMGRNA in the last 8760 hours.  Assessment and Plan:  Eosinophilia: Tonya Mueller, 59 year old female, presents today to the [facility] Interventional Radiology department for an image-guided bone marrow biopsy with aspiration.   Risks and benefits of this procedure were discussed with the patient and/or patient's family including, but not limited to bleeding, infection, damage to adjacent structures or low yield requiring additional tests.  All of the questions were answered and there is agreement to proceed. She has been NPO.   Consent signed and in chart.  Thank you for this interesting consult.  I greatly enjoyed meeting Tonya Mueller and look forward to participating in their care.  A copy of this report was sent to the requesting provider on this date.  Electronically Signed: Warren Dais,  AGACNP-BC 09/10/2024, 7:50 AM   I spent a total of  30 Minutes   in face to face in clinical consultation, greater than 50% of which was counseling/coordinating care for eosinophilia.   "

## 2024-09-10 NOTE — Telephone Encounter (Signed)
 Bone marrow biopsy done today 09/10/24 at 8:30am.  Per Dr. Melanee When BM BX done find out patho who will be  reading bm bx and have them contact Melanee.  Outbound call to patho spoke to [employer] who advised to contact [employer] instead.

## 2024-09-11 LAB — SURGICAL PATHOLOGY

## 2024-09-12 LAB — FUNGUS CULTURE WITH STAIN

## 2024-09-12 LAB — MISC LABCORP TEST (SEND OUT): Labcorp test code: 3004472

## 2024-09-12 LAB — FUNGAL ORGANISM REFLEX

## 2024-09-12 LAB — FUNGUS CULTURE RESULT

## 2024-09-17 ENCOUNTER — Encounter (HOSPITAL_COMMUNITY): Payer: Self-pay

## 2024-09-19 ENCOUNTER — Encounter (HOSPITAL_COMMUNITY): Payer: Self-pay

## 2024-09-23 LAB — ACID FAST CULTURE WITH REFLEXED SENSITIVITIES (MYCOBACTERIA): Acid Fast Culture: NEGATIVE

## 2024-09-26 LAB — ACID FAST CULTURE WITH REFLEXED SENSITIVITIES (MYCOBACTERIA): Acid Fast Culture: NEGATIVE
